# Patient Record
Sex: Female | Born: 1965 | Race: White | Hispanic: No | Marital: Married | Smoking: Never smoker
Health system: Southern US, Community
[De-identification: ages and names within clinical notes are randomized; demographics above are authoritative.]

## PROBLEM LIST (undated history)

## (undated) DIAGNOSIS — I10 Essential (primary) hypertension: Secondary | ICD-10-CM

## (undated) DIAGNOSIS — R112 Nausea with vomiting, unspecified: Secondary | ICD-10-CM

## (undated) DIAGNOSIS — R002 Palpitations: Secondary | ICD-10-CM

## (undated) DIAGNOSIS — K589 Irritable bowel syndrome without diarrhea: Secondary | ICD-10-CM

## (undated) DIAGNOSIS — Z9221 Personal history of antineoplastic chemotherapy: Secondary | ICD-10-CM

## (undated) DIAGNOSIS — C50919 Malignant neoplasm of unspecified site of unspecified female breast: Secondary | ICD-10-CM

## (undated) DIAGNOSIS — Z9889 Other specified postprocedural states: Secondary | ICD-10-CM

## (undated) DIAGNOSIS — Z923 Personal history of irradiation: Secondary | ICD-10-CM

## (undated) HISTORY — DX: Malignant neoplasm of unspecified site of unspecified female breast: C50.919

## (undated) HISTORY — DX: Palpitations: R00.2

## (undated) HISTORY — DX: Irritable bowel syndrome, unspecified: K58.9

## (undated) HISTORY — PX: GALLBLADDER SURGERY: SHX652

## (undated) HISTORY — PX: CHOLECYSTECTOMY: SHX55

---

## 2014-07-23 NOTE — Progress Notes (Signed)
     HPI: 49 year old female for evaluation of palpitations. Patient had outside echocardiogram in February 2016 that showed normal LV function and mild to moderate mitral regurgitation. Patient had brief palpitations when she was 49 years old that resolved with beta blockade. She discontinued this medication on her own after 2 years. She did well until this past January when she developed palpitations described as a "hard heart beat". No sustained palpitations. Otherwise denies dyspnea on exertion, orthopnea, PND, pedal edema, syncope or chest pain.  Current Outpatient Prescriptions  Medication Sig Dispense Refill  . metoprolol succinate (TOPROL-XL) 25 MG 24 hr tablet Take 12.5 mg by mouth daily.  11  . Multiple Vitamin (MULTIVITAMIN WITH MINERALS) TABS tablet Take 1 tablet by mouth daily.    . TRI-PREVIFEM 0.18/0.215/0.25 MG-35 MCG tablet Take 1 tablet by mouth daily.  11   No current facility-administered medications for this visit.    Allergies  Allergen Reactions  . Shellfish Allergy Nausea And Vomiting     Past Medical History  Diagnosis Date  . Palpitations     Past Surgical History  Procedure Laterality Date  . Gallbladder surgery      History   Social History  . Marital Status: Single    Spouse Name: N/A  . Number of Children: 2  . Years of Education: N/A   Occupational History  .      Hair dresser   Social History Main Topics  . Smoking status: Never Smoker   . Smokeless tobacco: Not on file  . Alcohol Use: 0.0 oz/week    0 Standard drinks or equivalent per week     Comment: Glass wine per day  . Drug Use: No  . Sexual Activity: Not on file   Other Topics Concern  . Not on file   Social History Narrative    Family History  Problem Relation Age of Onset  . Hypertension Mother   . Heart attack Mother   . Hypertension Brother   . Stroke Maternal Grandmother   . Heart disease Mother     Atrial fibrillation    ROS: no fevers or chills,  productive cough, hemoptysis, dysphasia, odynophagia, melena, hematochezia, dysuria, hematuria, rash, seizure activity, orthopnea, PND, pedal edema, claudication. Remaining systems are negative.  Physical Exam:   Blood pressure 128/80, pulse 67, height 5\' 7"  (1.702 m), weight 160 lb 6.4 oz (72.757 kg).  General:  Well developed/well nourished in NAD Skin warm/dry Patient not depressed No peripheral clubbing Back-normal HEENT-normal/normal eyelids Neck supple/normal carotid upstroke bilaterally; no bruits; no JVD; no thyromegaly chest - CTA/ normal expansion CV - RRR/normal S1 and S2; no murmurs, rubs or gallops;  PMI nondisplaced Abdomen -NT/ND, no HSM, no mass, + bowel sounds, no bruit 2+ femoral pulses, no bruits Ext-no edema, chords, 2+ DP Neuro-grossly nonfocal  ECG sinus rhythm, RV conduction delay, no ST changes.

## 2014-07-26 ENCOUNTER — Encounter: Payer: Self-pay | Admitting: Cardiology

## 2014-07-26 ENCOUNTER — Ambulatory Visit (INDEPENDENT_AMBULATORY_CARE_PROVIDER_SITE_OTHER): Payer: BLUE CROSS/BLUE SHIELD | Admitting: Cardiology

## 2014-07-26 VITALS — BP 128/80 | HR 67 | Ht 67.0 in | Wt 160.4 lb

## 2014-07-26 DIAGNOSIS — I340 Nonrheumatic mitral (valve) insufficiency: Secondary | ICD-10-CM | POA: Diagnosis not present

## 2014-07-26 DIAGNOSIS — R002 Palpitations: Secondary | ICD-10-CM

## 2014-07-26 NOTE — Assessment & Plan Note (Signed)
Plan follow-up echocardiogram in the future. 

## 2014-07-26 NOTE — Assessment & Plan Note (Signed)
Patient symptoms have improved after the addition of beta blockade. Continue Toprol. LV function normal on echocardiogram. If her symptoms worsen in the future we will plan an event monitor. I will obtain laboratories from her primary care physician concerning recent TSH and potassium.

## 2014-07-26 NOTE — Patient Instructions (Signed)
Your physician wants you to follow-up in: [place] will receive a reminder letter in the mail two months in advance. If you don't receive a letter, please call our office to schedule the follow-up appointment.

## 2014-08-19 ENCOUNTER — Encounter: Payer: Self-pay | Admitting: Cardiology

## 2014-08-20 ENCOUNTER — Other Ambulatory Visit: Payer: Self-pay | Admitting: *Deleted

## 2014-08-20 DIAGNOSIS — R002 Palpitations: Secondary | ICD-10-CM

## 2014-08-23 ENCOUNTER — Other Ambulatory Visit (INDEPENDENT_AMBULATORY_CARE_PROVIDER_SITE_OTHER): Payer: BLUE CROSS/BLUE SHIELD

## 2014-08-23 DIAGNOSIS — R002 Palpitations: Secondary | ICD-10-CM | POA: Diagnosis not present

## 2014-08-23 LAB — TSH: TSH: 3.45 u[IU]/mL (ref 0.35–4.50)

## 2015-02-07 ENCOUNTER — Other Ambulatory Visit (HOSPITAL_BASED_OUTPATIENT_CLINIC_OR_DEPARTMENT_OTHER): Payer: Self-pay | Admitting: Unknown Physician Specialty

## 2015-02-07 ENCOUNTER — Ambulatory Visit (HOSPITAL_BASED_OUTPATIENT_CLINIC_OR_DEPARTMENT_OTHER)
Admission: RE | Admit: 2015-02-07 | Discharge: 2015-02-07 | Disposition: A | Discharge status: Home / Self Care | Payer: BLUE CROSS/BLUE SHIELD | Source: Ambulatory Visit | Attending: Unknown Physician Specialty | Admitting: Unknown Physician Specialty

## 2015-02-07 DIAGNOSIS — Z1231 Encounter for screening mammogram for malignant neoplasm of breast: Secondary | ICD-10-CM

## 2016-07-04 NOTE — Progress Notes (Signed)
      HPI: FU palpitations. Patient had outside echocardiogram in February 2016 that showed normal LV function and mild to moderate mitral regurgitation. Since last seen, patient denies dyspnea, chest pain, or syncope. She continues to have an occasional skip which she finds tolerable but no sustained palpitations.   Current Outpatient Prescriptions  Medication Sig Dispense Refill  . metoprolol succinate (TOPROL-XL) 25 MG 24 hr tablet Take 12.5 mg by mouth daily.  11  . Multiple Vitamin (MULTIVITAMIN WITH MINERALS) TABS tablet Take 1 tablet by mouth daily.    . TRI-PREVIFEM 0.18/0.215/0.25 MG-35 MCG tablet Take 1 tablet by mouth daily.  11   No current facility-administered medications for this visit.      Past Medical History:  Diagnosis Date  . Palpitations     Past Surgical History:  Procedure Laterality Date  . GALLBLADDER SURGERY      Social History   Social History  . Marital status: Single    Spouse name: N/A  . Number of children: 2  . Years of education: N/A   Occupational History  .      Hair dresser   Social History Main Topics  . Smoking status: Never Smoker  . Smokeless tobacco: Never Used  . Alcohol use 0.0 oz/week     Comment: Glass wine per day  . Drug use: No  . Sexual activity: Not on file   Other Topics Concern  . Not on file   Social History Narrative  . No narrative on file    Family History  Problem Relation Age of Onset  . Hypertension Mother   . Heart attack Mother   . Heart disease Mother     Atrial fibrillation  . Hypertension Brother   . Stroke Maternal Grandmother     ROS: no fevers or chills, productive cough, hemoptysis, dysphasia, odynophagia, melena, hematochezia, dysuria, hematuria, rash, seizure activity, orthopnea, PND, pedal edema, claudication. Remaining systems are negative.  Physical Exam: Well-developed well-nourished in no acute distress.  Skin is warm and dry.  HEENT is normal.  Neck is supple. No  bruits Chest is clear to auscultation with normal expansion.  Cardiovascular exam is regular rate and rhythm.  Abdominal exam nontender or distended. No masses palpated. Extremities show no edema. neuro grossly intact  ECG- Normal sinus rhythm at a rate of 68. First-degree AV block. RV conduction delay. personally reviewed  A/P  1 Palpitations-symptoms are reasonably well controlled. Continue beta blocker.  2 mitral regurgitation-plan repeat echocardiogram.  Kirk Ruths, MD

## 2016-07-05 ENCOUNTER — Encounter: Payer: Self-pay | Admitting: Cardiology

## 2016-07-18 ENCOUNTER — Encounter: Payer: Self-pay | Admitting: Cardiology

## 2016-07-18 ENCOUNTER — Ambulatory Visit (INDEPENDENT_AMBULATORY_CARE_PROVIDER_SITE_OTHER): Payer: BLUE CROSS/BLUE SHIELD | Admitting: Cardiology

## 2016-07-18 VITALS — BP 130/78 | HR 68 | Ht 67.0 in | Wt 155.2 lb

## 2016-07-18 DIAGNOSIS — R002 Palpitations: Secondary | ICD-10-CM

## 2016-07-18 DIAGNOSIS — I059 Rheumatic mitral valve disease, unspecified: Secondary | ICD-10-CM

## 2016-07-18 MED ORDER — METOPROLOL SUCCINATE ER 25 MG PO TB24: 12.5000 mg | ORAL_TABLET | Freq: Every day | ORAL | 3 refills | Status: DC

## 2016-07-18 NOTE — Patient Instructions (Signed)
Medication Instructions:   Refill sent to the pharmacy electronically.   Testing/Procedures:  Your physician has requested that you have an echocardiogram. Echocardiography is a painless test that uses sound waves to create images of your heart. It provides your doctor with information about the size and shape of your heart and how well your heart's chambers and valves are working. This procedure takes approximately one hour. There are no restrictions for this procedure.    Follow-Up:  Your physician wants you to follow-up in: [place] will receive a reminder letter in the mail two months in advance. If you don't receive a letter, please call our office to schedule the follow-up appointment.   If you need a refill on your cardiac medications before your next appointment, please call your pharmacy.

## 2016-07-23 ENCOUNTER — Telehealth: Payer: Self-pay | Admitting: Cardiology

## 2016-07-23 NOTE — Telephone Encounter (Signed)
New Message     1. What dental office are you calling from? Dr Gilford Raid  2. What is your office phone and fax number? Fax [phone] ofc [phone]  3. What type of procedure is the patient having performed? Not scheduled yet there for consult  4. What date is procedure scheduled? Not schedule   5. What is your question (ex. Antibiotics prior to procedure, holding medication-we need to know how long dentist wants pt to hold med)? Does pt need premed? Or hold meds?

## 2016-07-23 NOTE — Telephone Encounter (Signed)
Spoke with dr Elyse Hsu office, they are wanting to know if patient needs antibiotics prior to any dental procedures. Patient has a history of Mitral Regurg, therefore; per the quidelines, she does not require antibiotics prior to dental procedures. Will fax this note to the confirmed fax number provided.

## 2016-08-01 ENCOUNTER — Ambulatory Visit (HOSPITAL_BASED_OUTPATIENT_CLINIC_OR_DEPARTMENT_OTHER): Payer: BLUE CROSS/BLUE SHIELD

## 2017-03-08 LAB — HM PAP SMEAR: HM Pap smear: NEGATIVE

## 2018-06-04 ENCOUNTER — Other Ambulatory Visit: Payer: Self-pay

## 2018-06-04 ENCOUNTER — Emergency Department (HOSPITAL_BASED_OUTPATIENT_CLINIC_OR_DEPARTMENT_OTHER)
Admission: EM | Admit: 2018-06-04 | Discharge: 2018-06-04 | Disposition: A | Discharge status: Home / Self Care | Payer: BLUE CROSS/BLUE SHIELD | Attending: Emergency Medicine | Admitting: Emergency Medicine

## 2018-06-04 ENCOUNTER — Emergency Department (HOSPITAL_BASED_OUTPATIENT_CLINIC_OR_DEPARTMENT_OTHER): Payer: BLUE CROSS/BLUE SHIELD

## 2018-06-04 ENCOUNTER — Encounter (HOSPITAL_BASED_OUTPATIENT_CLINIC_OR_DEPARTMENT_OTHER): Payer: Self-pay | Admitting: Emergency Medicine

## 2018-06-04 DIAGNOSIS — R109 Unspecified abdominal pain: Secondary | ICD-10-CM | POA: Diagnosis not present

## 2018-06-04 DIAGNOSIS — R079 Chest pain, unspecified: Secondary | ICD-10-CM | POA: Insufficient documentation

## 2018-06-04 DIAGNOSIS — Z79899 Other long term (current) drug therapy: Secondary | ICD-10-CM | POA: Diagnosis not present

## 2018-06-04 DIAGNOSIS — R1013 Epigastric pain: Secondary | ICD-10-CM | POA: Diagnosis not present

## 2018-06-04 LAB — URINALYSIS, ROUTINE W REFLEX MICROSCOPIC
Bilirubin Urine: NEGATIVE
GLUCOSE, UA: NEGATIVE mg/dL
KETONES UR: NEGATIVE mg/dL
Nitrite: NEGATIVE
PROTEIN: NEGATIVE mg/dL
Specific Gravity, Urine: 1.02 (ref 1.005–1.030)
pH: 6.5 (ref 5.0–8.0)

## 2018-06-04 LAB — CBC WITH DIFFERENTIAL/PLATELET
Abs Immature Granulocytes: 0.01 10*3/uL (ref 0.00–0.07)
BASOS PCT: 1 %
Basophils Absolute: 0 10*3/uL (ref 0.0–0.1)
EOS PCT: 4 %
Eosinophils Absolute: 0.2 10*3/uL (ref 0.0–0.5)
HCT: 39.6 % (ref 36.0–46.0)
Hemoglobin: 12.7 g/dL (ref 12.0–15.0)
Immature Granulocytes: 0 %
Lymphocytes Relative: 26 %
Lymphs Abs: 1.5 10*3/uL (ref 0.7–4.0)
MCH: 31.1 pg (ref 26.0–34.0)
MCHC: 32.1 g/dL (ref 30.0–36.0)
MCV: 96.8 fL (ref 80.0–100.0)
Monocytes Absolute: 0.4 10*3/uL (ref 0.1–1.0)
Monocytes Relative: 7 %
NRBC: 0 % (ref 0.0–0.2)
Neutro Abs: 3.7 10*3/uL (ref 1.7–7.7)
Neutrophils Relative %: 62 %
PLATELETS: 282 10*3/uL (ref 150–400)
RBC: 4.09 MIL/uL (ref 3.87–5.11)
RDW: 12.4 % (ref 11.5–15.5)
WBC: 6 10*3/uL (ref 4.0–10.5)

## 2018-06-04 LAB — COMPREHENSIVE METABOLIC PANEL
ALK PHOS: 42 U/L (ref 38–126)
ALT: 13 U/L (ref 0–44)
ANION GAP: 7 (ref 5–15)
AST: 15 U/L (ref 15–41)
Albumin: 3.7 g/dL (ref 3.5–5.0)
BUN: 13 mg/dL (ref 6–20)
CALCIUM: 9 mg/dL (ref 8.9–10.3)
CHLORIDE: 105 mmol/L (ref 98–111)
CO2: 24 mmol/L (ref 22–32)
Creatinine, Ser: 0.72 mg/dL (ref 0.44–1.00)
GFR calc non Af Amer: >60 mL/min (ref >60)
Glucose, Bld: 100 mg/dL — ABNORMAL HIGH (ref 70–99)
POTASSIUM: 3.7 mmol/L (ref 3.5–5.1)
SODIUM: 136 mmol/L (ref 135–145)
Total Bilirubin: 0.5 mg/dL (ref 0.3–1.2)
Total Protein: 7.2 g/dL (ref 6.5–8.1)

## 2018-06-04 LAB — URINALYSIS, MICROSCOPIC (REFLEX)

## 2018-06-04 LAB — TROPONIN I

## 2018-06-04 LAB — D-DIMER, QUANTITATIVE (NOT AT ARMC)

## 2018-06-04 LAB — LIPASE, BLOOD: Lipase: 29 U/L (ref 11–51)

## 2018-06-04 NOTE — ED Provider Notes (Signed)
[facility] Provider Note   CSN: [id number] Arrival date & time: 06/04/18  5009    History   Chief Complaint Chief Complaint  Patient presents with  . Flank Pain    HPI Wendy Webb is a 53 y.o. female.     Patient presents to the emergency department for evaluation of right-sided pain.  Patient has had several days of intermittent pain in the right flank area and then noted that the pain became constant yesterday.  She reports it as a burning type sensation in the right upper abdomen/lower chest that radiates around to the back.  No associated cough, shortness of breath.  She has not noticed any rash.  She denies injury.  Pain does not worsen with movement.  She has had a previous cholecystectomy.  She does, however, report that the symptoms remind her of her gallbladder problems.  She has been experiencing constipation over the last month or so and has had increased belching.  She reports mild nausea, no vomiting.  No diarrhea.     Past Medical History:  Diagnosis Date  . Palpitations     Patient Active Problem List   Diagnosis Date Noted  . Palpitations 07/26/2014  . Mitral regurgitation 07/26/2014    Past Surgical History:  Procedure Laterality Date  . GALLBLADDER SURGERY       OB History   No obstetric history on file.      Home Medications    Prior to Admission medications   Medication Sig Start Date End Date Taking? Authorizing Provider  metoprolol succinate (TOPROL-XL) 25 MG 24 hr tablet Take 0.5 tablets (12.5 mg total) by mouth daily. 07/18/16   Lelon Perla, MD  Multiple Vitamin (MULTIVITAMIN WITH MINERALS) TABS tablet Take 1 tablet by mouth daily.    [provider]  TRI-PREVIFEM 0.18/0.215/0.25 MG-35 MCG tablet Take 1 tablet by mouth daily. 07/11/14   [provider]    Family History Family History  Problem Relation Age of Onset  . Hypertension Mother   . Heart attack Mother   . Heart  disease Mother        Atrial fibrillation  . Hypertension Brother   . Stroke Maternal Grandmother     Social History Social History   Tobacco Use  . Smoking status: Never Smoker  . Smokeless tobacco: Never Used  Substance Use Topics  . Alcohol use: Yes    Alcohol/week: 0.0 standard drinks    Comment: Glass wine per day  . Drug use: No     Allergies   Shellfish allergy   Review of Systems Review of Systems  Respiratory: Negative for cough and shortness of breath.   Gastrointestinal: Positive for abdominal pain, constipation and nausea.  All other systems reviewed and are negative.    Physical Exam Updated Vital Signs BP (!) 142/70 (BP Location: Right Arm)   Pulse 69   Temp 98.5 F (36.9 C) (Oral)   Resp 18   Ht 5\' 7"  (1.702 m)   Wt 69.4 kg   LMP 05/14/2018   SpO2 100%   BMI 23.96 kg/m   Physical Exam Vitals signs and nursing note reviewed.  Constitutional:      General: She is not in acute distress.    Appearance: Normal appearance. She is well-developed.  HENT:     Head: Normocephalic and atraumatic.     Right Ear: Hearing normal.     Left Ear: Hearing normal.     Nose: Nose normal.  Eyes:     Conjunctiva/sclera: Conjunctivae normal.     Pupils: Pupils are equal, round, and reactive to light.  Neck:     Musculoskeletal: Normal range of motion and neck supple.  Cardiovascular:     Rate and Rhythm: Regular rhythm.     Heart sounds: S1 normal and S2 normal. No murmur. No friction rub. No gallop.   Pulmonary:     Effort: Pulmonary effort is normal. No respiratory distress.     Breath sounds: Normal breath sounds.  Chest:     Chest wall: No tenderness.  Abdominal:     General: Bowel sounds are normal.     Palpations: Abdomen is soft.     Tenderness: There is abdominal tenderness (Slight) in the epigastric area. There is no guarding or rebound. Negative signs include Murphy's sign and McBurney's sign.     Hernia: No hernia is present.    Musculoskeletal: Normal range of motion.  Skin:    General: Skin is warm and dry.     Findings: No rash.  Neurological:     Mental Status: She is alert and oriented to person, place, and time.     GCS: GCS eye subscore is 4. GCS verbal subscore is 5. GCS motor subscore is 6.     Cranial Nerves: No cranial nerve deficit.     Sensory: No sensory deficit.     Coordination: Coordination normal.  Psychiatric:        Speech: Speech normal.        Behavior: Behavior normal.        Thought Content: Thought content normal.      ED Treatments / Results  Labs (all labs ordered are listed, but only abnormal results are displayed) Labs Reviewed  URINALYSIS, ROUTINE W REFLEX MICROSCOPIC - Abnormal; Notable for the following components:      Result Value   Hgb urine dipstick TRACE (*)    Leukocytes,Ua MODERATE (*)    All other components within normal limits  URINALYSIS, MICROSCOPIC (REFLEX) - Abnormal; Notable for the following components:   Bacteria, UA MANY (*)    All other components within normal limits  CBC WITH DIFFERENTIAL/PLATELET  COMPREHENSIVE METABOLIC PANEL  LIPASE, BLOOD  D-DIMER, QUANTITATIVE (NOT AT [facility])  TROPONIN I    EKG EKG Interpretation  Date/Time:  Wednesday June 04 2018 06:28:02 EST Ventricular Rate:  69 PR Interval:    QRS Duration: 104 QT Interval:  403 QTC Calculation: 432 R Axis:   -13 Text Interpretation:  Sinus rhythm Borderline prolonged PR interval Baseline wander in lead(s) II III aVF V4 Otherwise within normal limits No previous tracing Confirmed by Orpah Greek [phone]) on 06/04/2018 6:29:47 AM   Radiology No results found.  Procedures Procedures (including critical care time)  Medications Ordered in ED Medications - No data to display   Initial Impression / Assessment and Plan / ED Course  I have reviewed the triage vital signs and the nursing notes.  Pertinent labs & imaging results that were available during my care  of the patient were reviewed by me and considered in my medical decision making (see chart for details).        Patient indicates the area of the anterior and lateral mid rib fields as the area that she is experiencing the pain.  She does not have any respiratory symptoms, oxygenation is 100%.  No tachycardia or tachypnea.  She denies unilateral leg swelling, recent surgery, recent long distance travel.  She does take  oral birth control pills.  She has never had a blood clot.  Examination of the chest wall does not reveal any evidence of shingles.  There is no pain with light touch.  Pain is not significantly reproducible.  She does not, however, have any right upper quadrant tenderness.  There is some mild epigastric tenderness, but no guarding or rebound.  Differential diagnosis would be musculoskeletal pain, GI pain such as gastritis, cardiac pain, pulmonary etiology including infectious and PE.  Work-up initiated and will sign out to oncoming ER physician to follow-up imaging and labs, disposition patient.  Final Clinical Impressions(s) / ED Diagnoses   Final diagnoses:  Right-sided chest pain    ED Discharge Orders    None       Orpah Greek, MD 06/04/18 [phone]

## 2018-06-04 NOTE — ED Provider Notes (Signed)
Patient care assumed at 0700. Patient here for evaluation of one week of right sided chest wall/flank pain. UA with bacteria present, no additional findings concerning for UTI and patient does not have any dysuria, will send culture and only treat if positive. Presentation is not consistent with ACS, PE, dissection, acute abdomen. Discussed with patient home care for right-sided pain. Discussed outpatient follow-up and return precautions.   Quintella Reichert, MD 06/04/18 1525

## 2018-06-04 NOTE — ED Triage Notes (Signed)
Pt c/o flank pain for around a week right upper abd radiating to the back. Denies any urinary symptoms.

## 2018-06-05 LAB — URINE CULTURE: CULTURE: NO GROWTH

## 2018-06-09 DIAGNOSIS — M549 Dorsalgia, unspecified: Secondary | ICD-10-CM | POA: Diagnosis not present

## 2018-12-14 DIAGNOSIS — S40862A Insect bite (nonvenomous) of left upper arm, initial encounter: Secondary | ICD-10-CM | POA: Diagnosis not present

## 2019-02-02 DIAGNOSIS — R1011 Right upper quadrant pain: Secondary | ICD-10-CM | POA: Diagnosis not present

## 2019-02-02 DIAGNOSIS — Z0000 Encounter for general adult medical examination without abnormal findings: Secondary | ICD-10-CM | POA: Diagnosis not present

## 2019-02-09 DIAGNOSIS — R1011 Right upper quadrant pain: Secondary | ICD-10-CM | POA: Diagnosis not present

## 2019-02-09 DIAGNOSIS — R1010 Upper abdominal pain, unspecified: Secondary | ICD-10-CM | POA: Diagnosis not present

## 2019-02-09 DIAGNOSIS — Z0000 Encounter for general adult medical examination without abnormal findings: Secondary | ICD-10-CM | POA: Diagnosis not present

## 2019-04-02 DIAGNOSIS — Z20828 Contact with and (suspected) exposure to other viral communicable diseases: Secondary | ICD-10-CM | POA: Diagnosis not present

## 2019-05-22 DIAGNOSIS — I10 Essential (primary) hypertension: Secondary | ICD-10-CM | POA: Diagnosis not present

## 2019-05-29 DIAGNOSIS — N951 Menopausal and female climacteric states: Secondary | ICD-10-CM | POA: Diagnosis not present

## 2019-05-29 DIAGNOSIS — I10 Essential (primary) hypertension: Secondary | ICD-10-CM | POA: Diagnosis not present

## 2019-05-29 DIAGNOSIS — Z01419 Encounter for gynecological examination (general) (routine) without abnormal findings: Secondary | ICD-10-CM | POA: Diagnosis not present

## 2019-05-29 DIAGNOSIS — G43109 Migraine with aura, not intractable, without status migrainosus: Secondary | ICD-10-CM | POA: Diagnosis not present

## 2019-05-30 ENCOUNTER — Emergency Department (HOSPITAL_BASED_OUTPATIENT_CLINIC_OR_DEPARTMENT_OTHER): Payer: BC Managed Care – PPO

## 2019-05-30 ENCOUNTER — Encounter (HOSPITAL_BASED_OUTPATIENT_CLINIC_OR_DEPARTMENT_OTHER): Payer: Self-pay | Admitting: Emergency Medicine

## 2019-05-30 ENCOUNTER — Other Ambulatory Visit: Payer: Self-pay

## 2019-05-30 ENCOUNTER — Emergency Department (HOSPITAL_BASED_OUTPATIENT_CLINIC_OR_DEPARTMENT_OTHER)
Admission: EM | Admit: 2019-05-30 | Discharge: 2019-05-30 | Disposition: A | Discharge status: Home / Self Care | Payer: BC Managed Care – PPO | Attending: Emergency Medicine | Admitting: Emergency Medicine

## 2019-05-30 DIAGNOSIS — I10 Essential (primary) hypertension: Secondary | ICD-10-CM | POA: Insufficient documentation

## 2019-05-30 DIAGNOSIS — R1013 Epigastric pain: Secondary | ICD-10-CM | POA: Diagnosis not present

## 2019-05-30 DIAGNOSIS — Z79899 Other long term (current) drug therapy: Secondary | ICD-10-CM | POA: Insufficient documentation

## 2019-05-30 DIAGNOSIS — R1030 Lower abdominal pain, unspecified: Secondary | ICD-10-CM | POA: Diagnosis not present

## 2019-05-30 DIAGNOSIS — R1011 Right upper quadrant pain: Secondary | ICD-10-CM | POA: Diagnosis not present

## 2019-05-30 DIAGNOSIS — R1084 Generalized abdominal pain: Secondary | ICD-10-CM | POA: Insufficient documentation

## 2019-05-30 DIAGNOSIS — R109 Unspecified abdominal pain: Secondary | ICD-10-CM | POA: Diagnosis not present

## 2019-05-30 DIAGNOSIS — R1010 Upper abdominal pain, unspecified: Secondary | ICD-10-CM | POA: Diagnosis not present

## 2019-05-30 HISTORY — DX: Essential (primary) hypertension: I10

## 2019-05-30 LAB — CBC WITH DIFFERENTIAL/PLATELET
Abs Immature Granulocytes: 0.03 10*3/uL (ref 0.00–0.07)
Basophils Absolute: 0 10*3/uL (ref 0.0–0.1)
Basophils Relative: 0 %
Eosinophils Absolute: 0 10*3/uL (ref 0.0–0.5)
Eosinophils Relative: 0 %
HCT: 40.2 % (ref 36.0–46.0)
Hemoglobin: 13.2 g/dL (ref 12.0–15.0)
Immature Granulocytes: 0 %
Lymphocytes Relative: 13 %
Lymphs Abs: 1.2 10*3/uL (ref 0.7–4.0)
MCH: 31.9 pg (ref 26.0–34.0)
MCHC: 32.8 g/dL (ref 30.0–36.0)
MCV: 97.1 fL (ref 80.0–100.0)
Monocytes Absolute: 0.5 10*3/uL (ref 0.1–1.0)
Monocytes Relative: 6 %
Neutro Abs: 7.1 10*3/uL (ref 1.7–7.7)
Neutrophils Relative %: 81 %
Platelets: 250 10*3/uL (ref 150–400)
RBC: 4.14 MIL/uL (ref 3.87–5.11)
RDW: 12.7 % (ref 11.5–15.5)
WBC: 8.9 10*3/uL (ref 4.0–10.5)
nRBC: 0 % (ref 0.0–0.2)

## 2019-05-30 LAB — COMPREHENSIVE METABOLIC PANEL
ALT: 15 U/L (ref 0–44)
AST: 16 U/L (ref 15–41)
Albumin: 3.3 g/dL — ABNORMAL LOW (ref 3.5–5.0)
Alkaline Phosphatase: 39 U/L (ref 38–126)
Anion gap: 7 (ref 5–15)
BUN: 11 mg/dL (ref 6–20)
CO2: 26 mmol/L (ref 22–32)
Calcium: 8.9 mg/dL (ref 8.9–10.3)
Chloride: 102 mmol/L (ref 98–111)
Creatinine, Ser: 0.65 mg/dL (ref 0.44–1.00)
GFR calc Af Amer: >60 mL/min (ref >60)
GFR calc non Af Amer: >60 mL/min (ref >60)
Glucose, Bld: 99 mg/dL (ref 70–99)
Potassium: 4 mmol/L (ref 3.5–5.1)
Sodium: 135 mmol/L (ref 135–145)
Total Bilirubin: 0.3 mg/dL (ref 0.3–1.2)
Total Protein: 6.7 g/dL (ref 6.5–8.1)

## 2019-05-30 LAB — URINALYSIS, ROUTINE W REFLEX MICROSCOPIC
Bilirubin Urine: NEGATIVE
Glucose, UA: NEGATIVE mg/dL
Ketones, ur: 15 mg/dL — AB
Nitrite: NEGATIVE
Protein, ur: NEGATIVE mg/dL
Specific Gravity, Urine: 1.02 (ref 1.005–1.030)
pH: 5.5 (ref 5.0–8.0)

## 2019-05-30 LAB — URINALYSIS, MICROSCOPIC (REFLEX)

## 2019-05-30 LAB — PREGNANCY, URINE: Preg Test, Ur: NEGATIVE

## 2019-05-30 LAB — LIPASE, BLOOD: Lipase: 21 U/L (ref 11–51)

## 2019-05-30 MED ORDER — IOHEXOL 300 MG/ML  SOLN
100.0000 mL | Freq: Once | INTRAMUSCULAR | Status: AC | PRN
  Administered 2019-05-30: 100 mL via INTRAVENOUS

## 2019-05-30 MED ORDER — FAMOTIDINE IN NACL 20-0.9 MG/50ML-% IV SOLN
20.0000 mg | Freq: Once | INTRAVENOUS | Status: AC
  Administered 2019-05-30: 20 mg via INTRAVENOUS
  Filled 2019-05-30: qty 50

## 2019-05-30 MED ORDER — DICYCLOMINE HCL 20 MG PO TABS: 20.0000 mg | ORAL_TABLET | Freq: Two times a day (BID) | ORAL | 0 refills | Status: DC

## 2019-05-30 MED ORDER — AMOXICILLIN-POT CLAVULANATE 875-125 MG PO TABS: 1.0000 | ORAL_TABLET | Freq: Two times a day (BID) | ORAL | 0 refills | Status: DC

## 2019-05-30 MED ORDER — PANTOPRAZOLE SODIUM 40 MG IV SOLR
40.0000 mg | Freq: Once | INTRAVENOUS | Status: AC
  Administered 2019-05-30: 40 mg via INTRAVENOUS
  Filled 2019-05-30: qty 40

## 2019-05-30 MED ORDER — ONDANSETRON 4 MG PO TBDP: 4.0000 mg | ORAL_TABLET | Freq: Three times a day (TID) | ORAL | 0 refills | Status: DC | PRN

## 2019-05-30 MED ORDER — SODIUM CHLORIDE 0.9 % IV BOLUS
1000.0000 mL | Freq: Once | INTRAVENOUS | Status: AC
  Administered 2019-05-30: 1000 mL via INTRAVENOUS

## 2019-05-30 NOTE — ED Triage Notes (Signed)
Pt c/o BLQ pain since yesterday; denies NVD; pain worse with palpation; sent here from [facility]

## 2019-05-30 NOTE — ED Notes (Signed)
Patient transported to CT 

## 2019-05-30 NOTE — Discharge Instructions (Addendum)
Abdominal pain  Hand washing: Wash your hands throughout the day, but especially before and after touching the face, using the restroom, sneezing, coughing, or touching surfaces that have been coughed or sneezed upon. Hydration: Symptoms will be intensified and complicated by dehydration. Dehydration can also extend the duration of symptoms. Drink plenty of fluids and get plenty of rest. You should be drinking at least half a liter of water an hour to stay hydrated. Electrolyte drinks (ex. Gatorade, Powerade, Pedialyte) are also encouraged. You should be drinking enough fluids to make your urine light yellow, almost clear. If this is not the case, you are not drinking enough water. Please note that some of the treatments indicated below will not be effective if you are not adequately hydrated. Diet: Please concentrate on hydration, however, you may introduce food slowly.  Start with a clear liquid diet, progressed to a full liquid diet, and then bland solids as you are able. Pain or fever: Tylenol for pain or fever.  Nausea/vomiting: Use the ondansetron (generic for Zofran) for nausea or vomiting.  This medication may not prevent all vomiting or nausea, but can help facilitate better hydration. Things that can help with nausea/vomiting also include peppermint/menthol candies, vitamin B12, and ginger. Diarrhea: Should you developed diarrhea, you may use medications such as loperamide (Imodium) or Bismuth subsalicylate (Pepto-Bismol). Bentyl: This medication is what is known as an antispasmodic and is intended to help reduce abdominal discomfort. Follow-up: Follow-up with a primary care provider and/or gastroenterology on this matter. Return: Return should you develop a fever, bloody diarrhea, increased abdominal pain, uncontrolled vomiting, or any other major concerns.  For prescription assistance, may try using prescription discount sites or apps, such as goodrx.com

## 2019-05-30 NOTE — ED Provider Notes (Signed)
[place] EMERGENCY DEPARTMENT Provider Note   CSN: [id number] Arrival date & time: 05/30/19  [id number]     History Chief Complaint  Patient presents with  . Abdominal Pain    Wendy Webb is a 54 y.o. female.  HPI      Wendy Webb is a 54 y.o. female, with a history of HTN and cholecystectomy, presenting to the ED with upper abdominal pain for the last 2 days.  Pain seems to be centered in the epigastric and right upper quadrant regions, "feels like a twisting," waxing and waning, 3-7/10, worse last night, worse with lying down, radiating throughout the abdomen.  She has not had this pain before. LMP May 14, 2019.  Last bowel movement was this morning and was normal. She typically consumes about 6 alcoholic beverages a week.  Denies NSAID use.  Denies illicit drug use. Denies fever/chills, chest pain, shortness of breath, N/V/C/D, dysuria, hematuria, dizziness, diaphoresis, hematochezia/melena, or any other complaints.    Past Medical History:  Diagnosis Date  . Hypertension   . Palpitations     Patient Active Problem List   Diagnosis Date Noted  . Palpitations 07/26/2014  . Mitral regurgitation 07/26/2014    Past Surgical History:  Procedure Laterality Date  . GALLBLADDER SURGERY       OB History   No obstetric history on file.     Family History  Problem Relation Age of Onset  . Hypertension Mother   . Heart attack Mother   . Heart disease Mother        Atrial fibrillation  . Hypertension Brother   . Stroke Maternal Grandmother     Social History   Tobacco Use  . Smoking status: Never Smoker  . Smokeless tobacco: Never Used  Substance Use Topics  . Alcohol use: Yes    Alcohol/week: 0.0 standard drinks    Comment: Glass wine per day  . Drug use: No    Home Medications Prior to Admission medications   Medication Sig Start Date End Date Taking? Authorizing Provider  lisinopril (ZESTRIL) 5 MG tablet Take 1/2 tablet by mouth once  daily for 2-3 days to reach BP goal of <140/90 on consistent basis; may increase to full tablet if well tolerated and needed to reach goal. 05/22/19  Yes [provider]  amoxicillin-clavulanate (AUGMENTIN) 875-125 MG tablet Take 1 tablet by mouth every 12 (twelve) hours. 05/30/19   Orlen Leedy C, PA-C  dicyclomine (BENTYL) 20 MG tablet Take 1 tablet (20 mg total) by mouth 2 (two) times daily. 05/30/19   Taeya Theall C, PA-C  metoprolol succinate (TOPROL-XL) 25 MG 24 hr tablet Take 0.5 tablets (12.5 mg total) by mouth daily. 07/18/16   Lelon Perla, MD  Multiple Vitamin (MULTIVITAMIN WITH MINERALS) TABS tablet Take 1 tablet by mouth daily.    [provider]  ondansetron (ZOFRAN ODT) 4 MG disintegrating tablet Take 1 tablet (4 mg total) by mouth every 8 (eight) hours as needed for nausea or vomiting. 05/30/19   Nateisha Moyd C, PA-C  TRI-PREVIFEM 0.18/0.215/0.25 MG-35 MCG tablet Take 1 tablet by mouth daily. 07/11/14   [provider]  Vitamins/Minerals TABS Take by mouth.    [provider]    Allergies    Shellfish allergy  Review of Systems   Review of Systems  Constitutional: Negative for chills, diaphoresis and fever.  Respiratory: Negative for shortness of breath.   Cardiovascular: Negative for chest pain and leg swelling.  Gastrointestinal: Positive for abdominal  pain. Negative for blood in stool, diarrhea, nausea and vomiting.  Genitourinary: Negative for difficulty urinating, dysuria and hematuria.  Musculoskeletal: Negative for back pain.  Neurological: Negative for dizziness, syncope and weakness.  All other systems reviewed and are negative.   Physical Exam Updated Vital Signs BP 125/75 (BP Location: Right Arm)   Pulse 74   Temp 98.1 F (36.7 C) (Oral)   Resp 20   Ht 5\' 7"  (1.702 m)   Wt 72.6 kg   LMP 05/14/2019   SpO2 100%   BMI 25.06 kg/m   Physical Exam Vitals and nursing note reviewed.  Constitutional:      General: She is not in  acute distress.    Appearance: She is well-developed. She is not diaphoretic.  HENT:     Head: Normocephalic and atraumatic.     Mouth/Throat:     Mouth: Mucous membranes are moist.     Pharynx: Oropharynx is clear.  Eyes:     Conjunctiva/sclera: Conjunctivae normal.  Cardiovascular:     Rate and Rhythm: Normal rate and regular rhythm.     Pulses: Normal pulses.          Radial pulses are 2+ on the right side and 2+ on the left side.       Posterior tibial pulses are 2+ on the right side and 2+ on the left side.     Heart sounds: Normal heart sounds.     Comments: Tactile temperature in the extremities appropriate and equal bilaterally. Pulmonary:     Effort: Pulmonary effort is normal. No respiratory distress.     Breath sounds: Normal breath sounds.  Abdominal:     Palpations: Abdomen is soft.     Tenderness: There is abdominal tenderness. There is right CVA tenderness. There is no left CVA tenderness or guarding.    Musculoskeletal:     Cervical back: Neck supple.     Right lower leg: No edema.     Left lower leg: No edema.  Lymphadenopathy:     Cervical: No cervical adenopathy.  Skin:    General: Skin is warm and dry.  Neurological:     Mental Status: She is alert.  Psychiatric:        Mood and Affect: Mood and affect normal.        Speech: Speech normal.        Behavior: Behavior normal.     ED Results / Procedures / Treatments   Labs (all labs ordered are listed, but only abnormal results are displayed) Labs Reviewed  URINALYSIS, ROUTINE W REFLEX MICROSCOPIC - Abnormal; Notable for the following components:      Result Value   Hgb urine dipstick SMALL (*)    Ketones, ur 15 (*)    Leukocytes,Ua TRACE (*)    All other components within normal limits  COMPREHENSIVE METABOLIC PANEL - Abnormal; Notable for the following components:   Albumin 3.3 (*)    All other components within normal limits  URINALYSIS, MICROSCOPIC (REFLEX) - Abnormal; Notable for the  following components:   Bacteria, UA MANY (*)    All other components within normal limits  URINE CULTURE  PREGNANCY, URINE  LIPASE, BLOOD  CBC WITH DIFFERENTIAL/PLATELET    EKG EKG Interpretation  Date/Time:  Saturday May 30 2019 11:27:04 EST Ventricular Rate:  69 PR Interval:    QRS Duration: 107 QT Interval:  415 QTC Calculation: 445 R Axis:   -7 Text Interpretation: Sinus rhythm RSR' in V1 or V2, right  VCD or RVH Confirmed by Madalyn Rob [phone]) on 05/30/2019 11:30:05 AM   Radiology CT ABDOMEN PELVIS W CONTRAST  Result Date: 05/30/2019 CLINICAL DATA:  Abdominal pain EXAM: CT ABDOMEN AND PELVIS WITH CONTRAST TECHNIQUE: Multidetector CT imaging of the abdomen and pelvis was performed using the standard protocol following bolus administration of intravenous contrast. CONTRAST:  129mL OMNIPAQUE IOHEXOL 300 MG/ML  SOLN COMPARISON:  Abdominal radiograph dated 06/04/2018 FINDINGS: Lower chest: There is minimal right basilar atelectasis. Hepatobiliary: No focal liver abnormality is seen. Status post cholecystectomy. No biliary dilatation. Pancreas: Unremarkable. No pancreatic ductal dilatation or surrounding inflammatory changes. Spleen: Normal in size without focal abnormality. Adrenals/Urinary Tract: Adrenal glands are unremarkable. Kidneys are normal, without renal calculi, focal lesion, or hydronephrosis. Bladder is unremarkable. Stomach/Bowel: Stomach is within normal limits. There is bowel wall thickening and hyperenhancement of the jejunum with moderate volume ascites. There are prominent mesenteric lymph nodes which are likely reactive. Appendix appears normal. No evidence of bowel obstruction. Vascular/Lymphatic: No significant vascular findings are present. No enlarged pelvic lymph nodes. Reproductive: Uterus and bilateral adnexa are unremarkable. Other: No abdominal wall hernia or abnormality. Musculoskeletal: Degenerative changes are seen at L5-S1. IMPRESSION: Bowel wall  thickening and hyperenhancement of the jejunum with moderate volume ascites. This likely reflects an infectious or inflammatory enteritis. Electronically Signed   By: Zerita Boers M.D.   On: 05/30/2019 12:20    Procedures Procedures (including critical care time)  Medications Ordered in ED Medications  sodium chloride 0.9 % bolus 1,000 mL ( Intravenous Stopped 05/30/19 1231)  pantoprazole (PROTONIX) injection 40 mg (40 mg Intravenous Given 05/30/19 1113)  famotidine (PEPCID) IVPB 20 mg premix ( Intravenous Stopped 05/30/19 1146)  iohexol (OMNIPAQUE) 300 MG/ML solution 100 mL (100 mLs Intravenous Contrast Given 05/30/19 1156)    ED Course  I have reviewed the triage vital signs and the nursing notes.  Pertinent labs & imaging results that were available during my care of the patient were reviewed by me and considered in my medical decision making (see chart for details).    MDM Rules/Calculators/A&P                      Patient presents with abdominal pain. Patient is nontoxic appearing, afebrile, not tachycardic, not tachypneic, not hypotensive, maintains excellent SPO2 on room air, and is in no apparent distress.  No leukocytosis.  Lab work reassuring. I have reviewed the patient's chart to obtain more information. I reviewed and interpreted the patient's labs and radiological studies. CT with bowel wall thickening and edema, radiologist interprets as possible enteritis. Enteritis is certainly a possibility, however, due to the patient's lack of other symptoms, such as diarrhea, we will encourage the patient to have close follow-up. Message was sent to patient's new PCP, Debbrah Alar, NP, to establish close follow up.  Information was also given at discharge for gastroenterology. Repeat abdominal exams reassuring. The patient was given instructions for home care as well as return precautions. Patient voices understanding of these instructions, accepts the plan, and is comfortable with  discharge.    Findings and plan of care discussed with Madalyn Rob, MD. Dr. Roslynn Amble personally evaluated and examined this patient.  Vitals:   05/30/19 1130 05/30/19 1210 05/30/19 1230 05/30/19 1300  BP: 130/73 133/68 137/79 135/71  Pulse: 69 86 75 72  Resp: 13 18 14 15   Temp:      TempSrc:      SpO2: 100% 100% 100% 100%  Weight:  Height:         Final Clinical Impression(s) / ED Diagnoses Final diagnoses:  Generalized abdominal pain    Rx / DC Orders ED Discharge Orders         Ordered    amoxicillin-clavulanate (AUGMENTIN) 875-125 MG tablet  Every 12 hours     05/30/19 1316    dicyclomine (BENTYL) 20 MG tablet  2 times daily     05/30/19 1316    ondansetron (ZOFRAN ODT) 4 MG disintegrating tablet  Every 8 hours PRN     05/30/19 1316           Lorayne Bender, PA-C 05/30/19 1338    Lucrezia Starch, MD 05/31/19 [phone]

## 2019-05-30 NOTE — ED Notes (Signed)
ED Provider at bedside. 

## 2019-05-31 ENCOUNTER — Telehealth: Payer: Self-pay | Admitting: Family

## 2019-05-31 LAB — URINE CULTURE: Culture: NO GROWTH

## 2019-05-31 NOTE — Telephone Encounter (Signed)
-----   Message from Lorayne Bender, [place] sent at 05/30/2019  1:17 PM EST ----- Ms. Inda Castle,  I saw Ms. Carbin in the [facility] ED today for abdominal pain. She tells me she has an appointment to establish care with you in the next couple weeks. I was wondering if there was a way she could see you sooner. The CT today was read as enteritis, however, other than abdominal pain, she didn't have any other symptoms typically found with enteritis.  This, of course, could still be the case, however, due to the lack of other symptoms and the amount of edema noted on the CT, I was hoping to get her close follow-up. My proposed plan was for her to have close follow-up with you and you could refer her to gastroenterology, as needed, based on her symptoms at that time.  However, if a part of this plan is not possible, please let me know and I will direct a message to gastroenterology to see if they would be able to follow-up with her in the next week or so.  Thank you,  Shawn   Lorayne Bender, PA-C

## 2019-05-31 NOTE — Telephone Encounter (Signed)
Please call pt to schedule an acute ED follow up visit with me this week. She has an establish care visit with me in March.

## 2019-06-01 NOTE — Progress Notes (Signed)
HPI: FU palpitations. Patient had outside echocardiogram in February 2016 that showed normal LV function and mild to moderate mitral regurgitation.  Repeat echocardiogram ordered at last office visit but not performed. Since last seen,  she denies dyspnea on exertion, orthopnea, PND, pedal edema, exertional chest pain or syncope.  Her palpitations are well controlled.  Current Outpatient Medications  Medication Sig Dispense Refill  . amoxicillin-clavulanate (AUGMENTIN) 875-125 MG tablet Take 1 tablet by mouth every 12 (twelve) hours. 14 tablet 0  . dicyclomine (BENTYL) 20 MG tablet Take 1 tablet (20 mg total) by mouth 2 (two) times daily. 20 tablet 0  . lisinopril (ZESTRIL) 5 MG tablet Take 1/2 tablet by mouth once daily for 2-3 days to reach BP goal of <140/90 on consistent basis; may increase to full tablet if well tolerated and needed to reach goal.    . Multiple Vitamin (MULTIVITAMIN WITH MINERALS) TABS tablet Take 1 tablet by mouth daily.    . Vitamins/Minerals TABS Take by mouth.     No current facility-administered medications for this visit.     Past Medical History:  Diagnosis Date  . Hypertension   . Palpitations     Past Surgical History:  Procedure Laterality Date  . GALLBLADDER SURGERY      Social History   Socioeconomic History  . Marital status: Married    Spouse name: Not on file  . Number of children: 2  . Years of education: Not on file  . Highest education level: Not on file  Occupational History    Comment: Hair dresser  Tobacco Use  . Smoking status: Never Smoker  . Smokeless tobacco: Never Used  Substance and Sexual Activity  . Alcohol use: Yes    Alcohol/week: 0.0 standard drinks    Comment: Glass wine per day  . Drug use: No  . Sexual activity: Not on file  Other Topics Concern  . Not on file  Social History Narrative  . Not on file   Social Determinants of Health   Financial Resource Strain:   . Difficulty of Paying Living  Expenses: Not on file  Food Insecurity:   . Worried About Charity fundraiser in the Last Year: Not on file  . Ran Out of Food in the Last Year: Not on file  Transportation Needs:   . Lack of Transportation (Medical): Not on file  . Lack of Transportation (Non-Medical): Not on file  Physical Activity:   . Days of Exercise per Week: Not on file  . Minutes of Exercise per Session: Not on file  Stress:   . Feeling of Stress : Not on file  Social Connections:   . Frequency of Communication with Friends and Family: Not on file  . Frequency of Social Gatherings with Friends and Family: Not on file  . Attends Religious Services: Not on file  . Active Member of Clubs or Organizations: Not on file  . Attends Archivist Meetings: Not on file  . Marital Status: Not on file  Intimate Partner Violence:   . Fear of Current or Ex-Partner: Not on file  . Emotionally Abused: Not on file  . Physically Abused: Not on file  . Sexually Abused: Not on file    Family History  Problem Relation Age of Onset  . Hypertension Mother   . Heart attack Mother   . Heart disease Mother        Atrial fibrillation  . Hypertension Brother   .  Stroke Maternal Grandmother     ROS: no fevers or chills, productive cough, hemoptysis, dysphasia, odynophagia, melena, hematochezia, dysuria, hematuria, rash, seizure activity, orthopnea, PND, pedal edema, claudication. Remaining systems are negative.  Physical Exam: Well-developed well-nourished in no acute distress.  Skin is warm and dry.  HEENT is normal.  Neck is supple.  Chest is clear to auscultation with normal expansion.  Cardiovascular exam is regular rate and rhythm.  Abdominal exam nontender or distended. No masses palpated. Extremities show no edema. neuro grossly intact  ECG-2021-sinus rhythm, RV conduction delay.  Personally reviewed  A/P  1 palpitations-continue beta-blocker at present dose.  Symptoms are well controlled.  2 history  of mild to moderate mitral regurgitation-repeat echocardiogram.  3 hypertension-blood pressure controlled.  Continue present medications.  Kirk Ruths, MD

## 2019-06-01 NOTE — Telephone Encounter (Signed)
Called pt no answer mail box was full and I wasn't able to leave a message. Will try to call back later

## 2019-06-02 NOTE — Telephone Encounter (Signed)
Called pt again left another message

## 2019-06-03 ENCOUNTER — Encounter: Payer: Self-pay | Admitting: Cardiology

## 2019-06-03 ENCOUNTER — Other Ambulatory Visit: Payer: Self-pay

## 2019-06-03 ENCOUNTER — Ambulatory Visit (INDEPENDENT_AMBULATORY_CARE_PROVIDER_SITE_OTHER): Payer: BC Managed Care – PPO | Admitting: Cardiology

## 2019-06-03 VITALS — BP 118/82 | HR 71 | Ht 67.0 in | Wt 157.8 lb

## 2019-06-03 DIAGNOSIS — I10 Essential (primary) hypertension: Secondary | ICD-10-CM

## 2019-06-03 DIAGNOSIS — I340 Nonrheumatic mitral (valve) insufficiency: Secondary | ICD-10-CM

## 2019-06-03 DIAGNOSIS — R002 Palpitations: Secondary | ICD-10-CM

## 2019-06-03 NOTE — Patient Instructions (Signed)
Medication Instructions:  NO CHANGE *If you need a refill on your cardiac medications before your next appointment, please call your pharmacy*  Lab Work: If you have labs (blood work) drawn today and your tests are completely normal, you will receive your results only by: Marland Kitchen MyChart Message (if you have MyChart) OR . A paper copy in the mail If you have any lab test that is abnormal or we need to change your treatment, we will call you to review the results.  Testing/Procedures: Your physician has requested that you have an echocardiogram. Echocardiography is a painless test that uses sound waves to create images of your heart. It provides your doctor with information about the size and shape of your heart and how well your heart's chambers and valves are working. This procedure takes approximately one hour. There are no restrictions for this procedure.[place]    Follow-Up: At [facility], you and your health needs are our priority.  As part of our continuing mission to provide you with exceptional heart care, we have created designated Provider Care Teams.  These Care Teams include your primary Cardiologist (physician) and Advanced Practice Providers (APPs -  Physician Assistants and Nurse Practitioners) who all work together to provide you with the care you need, when you need it.  Your next appointment:   12 month(s)  The format for your next appointment:   Either In Person or Virtual  Provider:   Kirk Ruths, MD

## 2019-06-08 DIAGNOSIS — K581 Irritable bowel syndrome with constipation: Secondary | ICD-10-CM | POA: Diagnosis not present

## 2019-06-08 DIAGNOSIS — I340 Nonrheumatic mitral (valve) insufficiency: Secondary | ICD-10-CM

## 2019-06-08 HISTORY — DX: Nonrheumatic mitral (valve) insufficiency: I34.0

## 2019-06-09 ENCOUNTER — Encounter: Payer: Self-pay | Admitting: Gastroenterology

## 2019-06-15 ENCOUNTER — Ambulatory Visit (HOSPITAL_BASED_OUTPATIENT_CLINIC_OR_DEPARTMENT_OTHER)
Admission: RE | Admit: 2019-06-15 | Discharge: 2019-06-15 | Disposition: A | Discharge status: Home / Self Care | Payer: BC Managed Care – PPO | Source: Ambulatory Visit | Attending: Cardiology | Admitting: Cardiology

## 2019-06-15 ENCOUNTER — Other Ambulatory Visit: Payer: Self-pay

## 2019-06-15 DIAGNOSIS — I340 Nonrheumatic mitral (valve) insufficiency: Secondary | ICD-10-CM | POA: Diagnosis not present

## 2019-06-15 NOTE — Progress Notes (Signed)
  Echocardiogram 2D Echocardiogram has been performed.  Cardell Peach 06/15/2019, 10:08 AM

## 2019-06-22 ENCOUNTER — Other Ambulatory Visit: Payer: Self-pay

## 2019-06-22 ENCOUNTER — Encounter: Payer: Self-pay | Admitting: Gastroenterology

## 2019-06-22 ENCOUNTER — Ambulatory Visit (INDEPENDENT_AMBULATORY_CARE_PROVIDER_SITE_OTHER): Payer: BC Managed Care – PPO | Admitting: Gastroenterology

## 2019-06-22 VITALS — BP 126/90 | HR 82 | Temp 98.0°F | Ht 67.0 in | Wt 159.5 lb

## 2019-06-22 DIAGNOSIS — K5900 Constipation, unspecified: Secondary | ICD-10-CM | POA: Diagnosis not present

## 2019-06-22 DIAGNOSIS — Z1211 Encounter for screening for malignant neoplasm of colon: Secondary | ICD-10-CM | POA: Diagnosis not present

## 2019-06-22 NOTE — Patient Instructions (Addendum)
Your provider has ordered Cologuard testing as an option for colon cancer screening. This is performed by [employer] and may be out of network with your insurance. PRIOR to completing the test, it is YOUR responsibility to contact your insurance about covered benefits for this test. Your out of pocket expense could be anywhere from $0.00 to $649.00.   When you call to check coverage with your insurer, please provide the following information:   -The ONLY provider of Cologuard is [place] code for Cologuard is [phone].  [employer] NPI # [id number]  -[employer] Tax ID # [id number]   We have already sent your demographic and insurance information to [employer] (phone number [phone]) and they should contact you within the next week regarding your test. If you have not heard from them within the next week, please call our office at [phone].   It was a pleasure to see you today!  Vito Cirigliano, D.O.

## 2019-06-22 NOTE — Progress Notes (Signed)
Chief Complaint: Abdominal pain, ER follow-up  Referring Provider:     Helyn Numbers, FNP   HPI:     Wendy Webb is a 54 y.o. female with a history of HTN and prior ccy,  referred to the Gastroenterology Clinic for evaluation of abdominal pain.  She was seen in the ER on 05/30/2019 with c/o MEG/RUQ pain.  Evaluation with normal CBC, CMP, lipase, hCG.  CT abdomen/pelvis: Bowel wall thickening and hyperenhancement of the jejunum with moderate volume ascites, reactive lymph nodes, c.w infectious/inflammatory enteritis.  Normal liver, pancreas, spleen.  Was prescribed a course of Augmentin and discharged home.    Was seen in follow-up by her PCM on 06/08/2019 with essentially resolution of abdominal pain at that time, with request for referral to GI.  At that time, also c/o chronic constipation (present for whole life) and was started on MiraLAX. Has tapered off Miralax and restarted OTC probiotic and high fiber diet.  Back to having 1 BM/day.  No abdominal pain. Otherwise, hasnt required laxatives, stool softeners, etc.   Today, she states she feels back to baseline.  No recurrence of abdominal pain as above.  Tolerating all p.o. intake.  Takes Pepcid since ER. Otherwise, no hx of reflux, dysphagia.  Does have periodic episodes of RUQ discomfort (not pain), which is fleeting and not lifestyle limiting.  Has been going on since ccy at age 62.  No prior EGD or Colonoscopy.   Mother with RA and "stomach issues", possibly UC vs IBS. Otherwise, no known FHx of CRC, GI malignancy, known IBD.     Past Medical History:  Diagnosis Date  . Hypertension   . Palpitations      Past Surgical History:  Procedure Laterality Date  . GALLBLADDER SURGERY     Family History  Problem Relation Age of Onset  . Hypertension Mother   . Heart attack Mother   . Heart disease Mother        Atrial fibrillation  . Hypertension Brother   . Stroke Maternal Grandmother   . Colon cancer Neg Hx    . Esophageal cancer Neg Hx    Social History   Tobacco Use  . Smoking status: Never Smoker  . Smokeless tobacco: Never Used  Substance Use Topics  . Alcohol use: Yes    Alcohol/week: 0.0 standard drinks    Comment: Glass wine per day  . Drug use: No   Current Outpatient Medications  Medication Sig Dispense Refill  . Famotidine (PEPCID AC PO) Take 1 tablet by mouth daily.    Marland Kitchen lisinopril (ZESTRIL) 5 MG tablet Take 1/2 tablet by mouth once daily for 2-3 days to reach BP goal of <140/90 on consistent basis; may increase to full tablet if well tolerated and needed to reach goal.    . Misc Natural Products (TART CHERRY ADVANCED PO) Take 1 tablet by mouth daily.    . Multiple Vitamin (MULTIVITAMIN WITH MINERALS) TABS tablet Take 1 tablet by mouth daily.    . Probiotic Product (PROBIOTIC PO) Take 1 tablet by mouth daily.     No current facility-administered medications for this visit.   Allergies  Allergen Reactions  . Shellfish Allergy Nausea And Vomiting     Review of Systems: All systems reviewed and negative except where noted in HPI.     Physical Exam:    Wt Readings from Last 3 Encounters:  06/22/19 159 lb 8 oz (72.3 kg)  06/03/19 157 lb 12.8 oz (71.6 kg)  05/30/19 160 lb (72.6 kg)    BP 126/90   Pulse 82   Temp 98 F (36.7 C)   Ht 5' 7"  (1.702 m)   Wt 159 lb 8 oz (72.3 kg)   BMI 24.98 kg/m  Constitutional:  Pleasant, in no acute distress. Psychiatric: Normal mood and affect. Behavior is normal. EENT: Pupils normal.  Conjunctivae are normal. No scleral icterus. Neck supple. No cervical LAD. Cardiovascular: Normal rate, regular rhythm. No edema Pulmonary/chest: Effort normal and breath sounds normal. No wheezing, rales or rhonchi. Abdominal: Soft, nondistended, nontender. Bowel sounds active throughout. There are no masses palpable. No hepatomegaly. Neurological: Alert and oriented to person place and time. Skin: Skin is warm and dry. No rashes noted.    ASSESSMENT AND PLAN;   1) Colon cancer screening: -Due for age-appropriate, average risk CRC screening.  Discussed options at length today, to include optical colonoscopy, virtual colonoscopy, FIT kit testing, Cologuard, and she strongly prefer Cologuard  2) Abdominal pain -Resolved -No history of reflux, so can discontinue Pepcid  3) Chronic constipation: -Longstanding history of chronic constipation for multiple decades.  Seemingly well controlled since restarting probiotics and high-fiber diet.  Has since tapered off laxatives and maintaining 1 BM/day -Continue adequate hydration, high-fiber diet -Okay to resume probiotic  4) Ascites noted on CT -Reported moderate volume ascites on recent CT, but the setting of enteritis.  Per radiologist, appearance consistent with inflammation.  Otherwise smooth liver contour on CT, normal liver enzymes, creatinine, albumin, platelets.  No ascites on exam today -Observation for now.  If any abdominal symptoms, consider abdominal ultrasound  RTC as needed  I spent 45 minutes of time, including in depth chart review, independent review of results as outlined above, communicating results with the patient directly, face-to-face time with the patient, coordinating care, ordering studies and medications as appropriate, and documentation.     Lavena Bullion, DO, FACG  06/22/2019, 10:29 AM   Wendy Alar, NP

## 2019-06-30 ENCOUNTER — Ambulatory Visit: Payer: BC Managed Care – PPO | Admitting: Family

## 2019-06-30 ENCOUNTER — Encounter: Payer: Self-pay | Admitting: Family

## 2019-06-30 ENCOUNTER — Other Ambulatory Visit: Payer: Self-pay

## 2019-06-30 ENCOUNTER — Encounter: Payer: Self-pay | Admitting: Gastroenterology

## 2019-06-30 VITALS — BP 122/66 | HR 72 | Temp 97.1°F | Resp 16 | Ht 67.0 in | Wt 156.0 lb

## 2019-06-30 DIAGNOSIS — K581 Irritable bowel syndrome with constipation: Secondary | ICD-10-CM

## 2019-06-30 DIAGNOSIS — Z1212 Encounter for screening for malignant neoplasm of rectum: Secondary | ICD-10-CM | POA: Diagnosis not present

## 2019-06-30 DIAGNOSIS — Z1211 Encounter for screening for malignant neoplasm of colon: Secondary | ICD-10-CM | POA: Diagnosis not present

## 2019-06-30 DIAGNOSIS — I340 Nonrheumatic mitral (valve) insufficiency: Secondary | ICD-10-CM

## 2019-06-30 DIAGNOSIS — I10 Essential (primary) hypertension: Secondary | ICD-10-CM | POA: Insufficient documentation

## 2019-06-30 DIAGNOSIS — R002 Palpitations: Secondary | ICD-10-CM | POA: Diagnosis not present

## 2019-06-30 DIAGNOSIS — K589 Irritable bowel syndrome without diarrhea: Secondary | ICD-10-CM | POA: Insufficient documentation

## 2019-06-30 LAB — COLOGUARD

## 2019-06-30 NOTE — Progress Notes (Signed)
Subjective:    Patient ID: Wendy Webb, female    DOB: June 26, 1965, 54 y.o.   MRN: [id number]  HPI  Patient is a 54 yr old female who presents today to establish care.  pmhx is significant for the following:  Palpitations- maintained on beta blocker, followed by cardiology   mitral regurgitation- followed by cardiology.  Previous echo noted moderate regurgitation. Most recent noted mild. Denies SOB.  HTN- She is on 2.5mg  of lisinopril once daily.  BP Readings from Last 3 Encounters:  06/30/19 122/66  06/22/19 126/90  06/03/19 118/82   Reports that she exercises daily.  Wt Readings from Last 3 Encounters:  06/30/19 156 lb (70.8 kg)  06/22/19 159 lb 8 oz (72.3 kg)  06/03/19 157 lb 12.8 oz (71.6 kg)   Palpitations- saw cardiology for this. Notes that symptoms were worse during perimenopause. Currently well controlled.   IBS-chronic constipation. Reports that she started a probiotic and this seems to help  She is taking otc pepcid not helping.      Review of Systems  Constitutional: Negative for unexpected weight change.  HENT: Negative for hearing loss and rhinorrhea.   Eyes: Negative for visual disturbance.  Respiratory: Positive for cough (dry cough due to lisinopril). Negative for shortness of breath.   Cardiovascular: Negative for chest pain.  Gastrointestinal: Negative for constipation (stays constipated) and diarrhea.  Genitourinary: Negative for dysuria and frequency.  Musculoskeletal: Negative for arthralgias and myalgias.  Skin: Negative for rash.  Neurological: Negative for headaches.  Hematological: Negative for adenopathy.  Psychiatric/Behavioral:       Denies depression or anxiety   Past Medical History:  Diagnosis Date  . Hypertension   . Palpitations      Social History   Socioeconomic History  . Marital status: Married    Spouse name: Not on file  . Number of children: 2  . Years of education: Not on file  . Highest education level: Not  on file  Occupational History    Comment: Hair dresser  Tobacco Use  . Smoking status: Never Smoker  . Smokeless tobacco: Never Used  Substance and Sexual Activity  . Alcohol use: Yes    Alcohol/week: 0.0 standard drinks    Comment: Glass wine per day  . Drug use: No  . Sexual activity: Yes    Partners: Male  Other Topics Concern  . 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 Difficulty of Paying Living Expenses:   Food Insecurity:   . Worried About Charity fundraiser in the Last Year:   . Arboriculturist in the Last Year:   Transportation Needs:   . Film/video editor (Medical):   Marland Kitchen Lack of Transportation (Non-Medical):   Physical Activity:   . Days of Exercise per Week:   . Minutes of Exercise per Session:   Stress:   . Feeling of Stress :   Social Connections:   . Frequency of Communication with Friends and Family:   . Frequency of Social Gatherings with Friends and Family:   . Attends Religious Services:   . Active Member of Clubs or Organizations:   . Attends Archivist Meetings:   Marland Kitchen Marital Status:  Intimate Partner Violence:   . Fear of Current or Ex-Partner:   . Emotionally Abused:   Marland Kitchen Physically Abused:   . Sexually Abused:     Past Surgical History:  Procedure Laterality Date  . GALLBLADDER SURGERY      Family History  Problem Relation Age of Onset  . Hypertension Mother   . Heart attack Mother   . Heart disease Mother        Atrial fibrillation  . Obstructive Sleep Apnea Mother   . Hypertension Brother   . Stroke Maternal Grandmother 3  . Colon cancer Neg Hx   . Esophageal cancer Neg Hx     Allergies  Allergen Reactions  . Shellfish Allergy Nausea And Vomiting    Current Outpatient Medications on File Prior  to Visit  Medication Sig Dispense Refill  . Famotidine (PEPCID AC PO) Take 1 tablet by mouth daily.    Marland Kitchen lisinopril (ZESTRIL) 5 MG tablet Take 1/2 tablet by mouth once daily for 2-3 days to reach BP goal of <140/90 on consistent basis; may increase to full tablet if well tolerated and needed to reach goal.    . Misc Natural Products (TART CHERRY ADVANCED PO) Take 1 tablet by mouth daily.    . Multiple Vitamin (MULTIVITAMIN WITH MINERALS) TABS tablet Take 1 tablet by mouth daily.    . Probiotic Product (PROBIOTIC PO) Take 1 tablet by mouth daily.     No current facility-administered medications on file prior to visit.    BP 122/66 (BP Location: Left Arm, Patient Position: Sitting, Cuff Size: Small)   Pulse 72   Temp (!) 97.1 F (36.2 C) (Temporal)   Resp 16   Ht 5\' 7"  (1.702 m)   Wt 156 lb (70.8 kg)   SpO2 100%   BMI 24.43 kg/m       Objective:   Physical Exam Constitutional:      Appearance: She is well-developed.  Neck:     Thyroid: No thyromegaly.  Cardiovascular:     Rate and Rhythm: Normal rate and regular rhythm.     Heart sounds: Normal heart sounds. No murmur.  Pulmonary:     Effort: Pulmonary effort is normal. No respiratory distress.     Breath sounds: Normal breath sounds. No wheezing.  Abdominal:     General: Bowel sounds are normal.     Palpations: Abdomen is soft.     Tenderness: There is no abdominal tenderness.  Musculoskeletal:     Cervical back: Neck supple.  Skin:    General: Skin is warm and dry.  Neurological:     Mental Status: She is alert and oriented to person, place, and time.  Psychiatric:        Behavior: Behavior normal.        Thought Content: Thought content normal.        Judgment: Judgment normal.           Assessment & Plan:  HTN- bp looks great. She reports home DBP is usually around 90. Will continue to monitor bp.  IBS- stable with addition of probiotic.  Mild mitral regurg- asymptomatic, managed by cardiology.   Hx  of palpitations- currently stable.  This visit occurred during the SARS-CoV-2 public health emergency.  Safety protocols were in place, including screening questions prior to the visit, additional usage of staff PPE, and extensive cleaning of exam room while observing appropriate contact time as indicated for disinfecting solutions.   35 minutes spent on today's visit reviewing  medical record, counseling pt and examining patient.

## 2019-07-04 LAB — COLOGUARD: COLOGUARD: NEGATIVE

## 2019-07-06 DIAGNOSIS — Z1239 Encounter for other screening for malignant neoplasm of breast: Secondary | ICD-10-CM | POA: Diagnosis not present

## 2019-07-06 DIAGNOSIS — R928 Other abnormal and inconclusive findings on diagnostic imaging of breast: Secondary | ICD-10-CM | POA: Diagnosis not present

## 2019-07-06 DIAGNOSIS — Z1231 Encounter for screening mammogram for malignant neoplasm of breast: Secondary | ICD-10-CM | POA: Diagnosis not present

## 2019-07-27 DIAGNOSIS — H5203 Hypermetropia, bilateral: Secondary | ICD-10-CM | POA: Diagnosis not present

## 2019-10-05 ENCOUNTER — Ambulatory Visit: Payer: BC Managed Care – PPO | Admitting: Family

## 2019-10-19 ENCOUNTER — Ambulatory Visit: Payer: BC Managed Care – PPO | Admitting: Family

## 2019-10-26 ENCOUNTER — Encounter: Payer: Self-pay | Admitting: Family

## 2019-10-26 ENCOUNTER — Other Ambulatory Visit: Payer: Self-pay

## 2019-10-26 ENCOUNTER — Ambulatory Visit: Payer: BC Managed Care – PPO | Admitting: Family

## 2019-10-26 ENCOUNTER — Telehealth: Payer: Self-pay | Admitting: Family

## 2019-10-26 VITALS — BP 137/78 | HR 81 | Temp 97.1°F | Resp 16 | Ht 67.0 in | Wt 157.0 lb

## 2019-10-26 DIAGNOSIS — K581 Irritable bowel syndrome with constipation: Secondary | ICD-10-CM

## 2019-10-26 DIAGNOSIS — K219 Gastro-esophageal reflux disease without esophagitis: Secondary | ICD-10-CM

## 2019-10-26 DIAGNOSIS — I10 Essential (primary) hypertension: Secondary | ICD-10-CM | POA: Diagnosis not present

## 2019-10-26 MED ORDER — OMEPRAZOLE 20 MG PO CPDR: 20.0000 mg | DELAYED_RELEASE_CAPSULE | Freq: Every day | ORAL | 3 refills | Status: DC

## 2019-10-26 NOTE — Telephone Encounter (Signed)
See mychart.  

## 2019-10-26 NOTE — Patient Instructions (Signed)
Please check blood pressure once daily for 1 week, and then send me your readings via mychart.

## 2019-10-26 NOTE — Progress Notes (Signed)
Subjective:    Patient ID: Wendy Webb, female    DOB: Oct 25, 1965, 54 y.o.   MRN: [id number]  HPI  Patient is a 54 yr old female who presents today for follow up.  HTN- no longer on lisinopril- she stopped after our last visit.   BP Readings from Last 3 Encounters:  10/26/19 (!) 146/90  06/30/19 122/66  06/22/19 126/90   IBS- ran out of probiotic, reports symptoms remain well controlled.   GERD- reports less gas/bloating since she started omeprazole.    Review of Systems See HPI  Past Medical History:  Diagnosis Date  . Hypertension   . IBS (irritable bowel syndrome)   . Mild mitral regurgitation 06/2019  . Palpitations      Social History   Socioeconomic History  . Marital status: Married    Spouse name: Not on file  . Number of children: 2  . Years of education: Not on file  . Highest education level: Not on file  Occupational History    Comment: Hair dresser  Tobacco Use  . Smoking status: Never Smoker  . Smokeless tobacco: Never Used  Vaping Use  . Vaping Use: Never used  Substance and Sexual Activity  . Alcohol use: Yes    Alcohol/week: 0.0 standard drinks    Comment: Glass wine per day  . Drug use: No  . Sexual activity: Yes    Partners: Male  Other Topics Concern  . 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 Difficulty of Paying Living Expenses:   Food Insecurity:   . Worried About Charity fundraiser in the Last Year:   . Arboriculturist in the Last Year:   Transportation Needs:   . Film/video editor (Medical):   Marland Kitchen Lack of Transportation (Non-Medical):   Physical Activity:   . Days of Exercise per Week:   . Minutes of Exercise per Session:   Stress:   . Feeling of Stress :   Social Connections:   . Frequency of  Communication with Friends and Family:   . Frequency of Social Gatherings with Friends and Family:   . Attends Religious Services:   . Active Member of Clubs or Organizations:   . Attends Archivist Meetings:   Marland Kitchen Marital Status:   Intimate Partner Violence:   . Fear of Current or Ex-Partner:   . Emotionally Abused:   Marland Kitchen Physically Abused:   . Sexually Abused:     Past Surgical History:  Procedure Laterality Date  . GALLBLADDER SURGERY      Family History  Problem Relation Age of Onset  . Hypertension Mother   . Heart attack Mother   . Heart disease Mother        Atrial fibrillation  . Obstructive Sleep Apnea Mother   . Hypertension Brother   . Stroke Maternal Grandmother 65  . Colon cancer Neg Hx   . Esophageal cancer Neg Hx     Allergies  Allergen Reactions  . Shellfish Allergy Nausea And Vomiting    Current Outpatient Medications on File Prior to Visit  Medication Sig Dispense Refill  . Famotidine (PEPCID AC PO) Take 1 tablet by mouth daily.    Marland Kitchen  Misc Natural Products (TART CHERRY ADVANCED PO) Take 1 tablet by mouth daily.    . Multiple Vitamin (MULTIVITAMIN WITH MINERALS) TABS tablet Take 1 tablet by mouth daily.    . Probiotic Product (PROBIOTIC PO) Take 1 tablet by mouth daily.     No current facility-administered medications on file prior to visit.    BP (!) 146/90 (BP Location: Right Arm, Patient Position: Sitting, Cuff Size: Normal)   Pulse 81   Temp (!) 97.1 F (36.2 C) (Temporal)   Resp 16   Ht 5\' 7"  (1.702 m)   Wt 157 lb (71.2 kg)   SpO2 98%   BMI 24.59 kg/m       Objective:   Physical Exam Constitutional:      Appearance: She is well-developed.  Cardiovascular:     Rate and Rhythm: Normal rate and regular rhythm.     Heart sounds: Normal heart sounds. No murmur heard.   Pulmonary:     Effort: Pulmonary effort is normal. No respiratory distress.     Breath sounds: Normal breath sounds. No wheezing.  Psychiatric:         Behavior: Behavior normal.        Thought Content: Thought content normal.        Judgment: Judgment normal.           Assessment & Plan:  HTN- initial bp was elevated, but follow up bp check is better. Pt stopped her lisinopril 2.5 mg on her own.  (she had a dry cough on this medication). I have asked her to check bp at home once daily for 1 week and then send me her readings via my chart. Advised pt  OK to remain off of lisinopril.    IBS- stable without probiotic. Monitor.   GERD- stable on omeprazole.  continue same.   This visit occurred during the SARS-CoV-2 public health emergency.  Safety protocols were in place, including screening questions prior to the visit, additional usage of staff PPE, and extensive cleaning of exam room while observing appropriate contact time as indicated for disinfecting solutions.

## 2019-10-28 ENCOUNTER — Telehealth: Payer: Self-pay | Admitting: Family

## 2019-10-28 NOTE — Telephone Encounter (Signed)
Patient said that she was told to monitor her blood pressure. She said that it has been staying in the 136/86 range. Please give her a call back at [phone] and advise.

## 2019-10-28 NOTE — Telephone Encounter (Signed)
Please advise pt that I think it is OK for her to remain off lisinopril. Follow up in 3 months.

## 2019-10-29 NOTE — Telephone Encounter (Signed)
Patient advised to stay off Lisinopril and follow up in 3 months.

## 2019-11-30 DIAGNOSIS — M2550 Pain in unspecified joint: Secondary | ICD-10-CM | POA: Diagnosis not present

## 2019-11-30 DIAGNOSIS — Z0000 Encounter for general adult medical examination without abnormal findings: Secondary | ICD-10-CM | POA: Diagnosis not present

## 2019-11-30 DIAGNOSIS — Z8616 Personal history of COVID-19: Secondary | ICD-10-CM | POA: Diagnosis not present

## 2020-05-02 DIAGNOSIS — D1801 Hemangioma of skin and subcutaneous tissue: Secondary | ICD-10-CM | POA: Diagnosis not present

## 2020-05-02 DIAGNOSIS — L814 Other melanin hyperpigmentation: Secondary | ICD-10-CM | POA: Diagnosis not present

## 2020-05-02 DIAGNOSIS — L821 Other seborrheic keratosis: Secondary | ICD-10-CM | POA: Diagnosis not present

## 2020-05-02 DIAGNOSIS — D225 Melanocytic nevi of trunk: Secondary | ICD-10-CM | POA: Diagnosis not present

## 2021-01-02 DIAGNOSIS — Z0000 Encounter for general adult medical examination without abnormal findings: Secondary | ICD-10-CM | POA: Diagnosis not present

## 2021-01-02 DIAGNOSIS — Z1389 Encounter for screening for other disorder: Secondary | ICD-10-CM | POA: Diagnosis not present

## 2021-01-02 DIAGNOSIS — Z1331 Encounter for screening for depression: Secondary | ICD-10-CM | POA: Diagnosis not present

## 2021-04-09 DIAGNOSIS — C50919 Malignant neoplasm of unspecified site of unspecified female breast: Secondary | ICD-10-CM

## 2021-04-09 HISTORY — DX: Malignant neoplasm of unspecified site of unspecified female breast: C50.919

## 2021-09-14 ENCOUNTER — Other Ambulatory Visit: Payer: Self-pay | Admitting: Physician Assistant

## 2021-09-14 DIAGNOSIS — N6310 Unspecified lump in the right breast, unspecified quadrant: Secondary | ICD-10-CM

## 2021-09-25 ENCOUNTER — Ambulatory Visit
Admission: RE | Admit: 2021-09-25 | Discharge: 2021-09-25 | Disposition: A | Discharge status: Home / Self Care | Payer: BC Managed Care – PPO | Source: Ambulatory Visit | Attending: Physician Assistant | Admitting: Physician Assistant

## 2021-09-25 ENCOUNTER — Other Ambulatory Visit: Payer: Self-pay | Admitting: Physician Assistant

## 2021-09-25 DIAGNOSIS — N6310 Unspecified lump in the right breast, unspecified quadrant: Secondary | ICD-10-CM

## 2021-09-26 ENCOUNTER — Ambulatory Visit
Admission: RE | Admit: 2021-09-26 | Discharge: 2021-09-26 | Disposition: A | Discharge status: Home / Self Care | Payer: BC Managed Care – PPO | Source: Ambulatory Visit | Attending: Physician Assistant | Admitting: Physician Assistant

## 2021-09-26 DIAGNOSIS — N6310 Unspecified lump in the right breast, unspecified quadrant: Secondary | ICD-10-CM

## 2021-09-27 ENCOUNTER — Encounter: Payer: Self-pay | Admitting: Family

## 2021-09-28 ENCOUNTER — Telehealth: Payer: Self-pay | Admitting: Hematology and Oncology

## 2021-09-28 ENCOUNTER — Encounter: Payer: Self-pay | Admitting: *Deleted

## 2021-09-28 DIAGNOSIS — Z171 Estrogen receptor negative status [ER-]: Secondary | ICD-10-CM

## 2021-09-28 DIAGNOSIS — C50411 Malignant neoplasm of upper-outer quadrant of right female breast: Secondary | ICD-10-CM

## 2021-09-28 NOTE — Telephone Encounter (Signed)
Spoke to patient to confirm morning clinic appointment for 6/28, paperwork sent via e-mail

## 2021-10-02 NOTE — Progress Notes (Signed)
Radiation Oncology         [phone]) [phone] ________________________________  Name: Wendy Webb        MRN: [id number]  Date of Service: 10/04/2021 DOB: 11-20-1965  CC:O'Sullivan, Lenna Sciara, NP  Jovita Kussmaul, MD     REFERRING PHYSICIAN: [name], MD   DIAGNOSIS: The encounter diagnosis was Malignant neoplasm of upper-outer quadrant of right breast in female, estrogen receptor negative ([place]).   HISTORY OF PRESENT ILLNESS: Wendy Webb is a 56 y.o. female seen in the multidisciplinary breast clinic for a new diagnosis of right breast cancer. The patient was noted to have a palpable lump in the right breast.  This was noted in the upper right breast and by ultrasound was located in the 12 o'clock position measuring up to 2.5 cm.  No evidence of any axillary adenopathy was present.  She underwent a biopsy on 09/26/2021 that showed grade 3 invasive ductal carcinoma of the right breast.  Her tumor was ER/PR negative, HER2 was equivocal and reflexed to negative.  She is seen today to discuss treatment recommendations of her cancer.   PREVIOUS RADIATION THERAPY: No   PAST MEDICAL HISTORY:  Past Medical History:  Diagnosis Date   Hypertension    IBS (irritable bowel syndrome)    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[place])    09/26/2021 (right breast biopsy)   Mild mitral regurgitation 06/2019   Palpitations        PAST SURGICAL HISTORY: Past Surgical History:  Procedure Laterality Date   GALLBLADDER SURGERY       FAMILY HISTORY:  Family History  Problem Relation Age of Onset   Hypertension Mother    Heart attack Mother    Heart disease Mother        Atrial fibrillation   Obstructive Sleep Apnea Mother    Hypertension Brother    Stroke Maternal Grandmother 75   Colon cancer Neg Hx    Esophageal cancer Neg Hx      SOCIAL HISTORY:  reports that she has never smoked. She has never used smokeless tobacco. She reports current alcohol use. She reports that she does not use  drugs. The patient is married and lives in [place]. She is a Theme park manager.   ALLERGIES: Ace inhibitors and Shellfish allergy   MEDICATIONS:  Current Outpatient Medications  Medication Sig Dispense Refill   Misc Natural Products (TART CHERRY ADVANCED PO) Take 1 tablet by mouth daily.     Multiple Vitamin (MULTIVITAMIN WITH MINERALS) TABS tablet Take 1 tablet by mouth daily.     omeprazole (PRILOSEC) 20 MG capsule Take 1 capsule (20 mg total) by mouth daily. 30 capsule 3   Probiotic Product (PROBIOTIC PO) Take 1 tablet by mouth daily.     No current facility-administered medications for this visit.     REVIEW OF SYSTEMS: On review of systems, the patient reports that she is doing well and is trying to navigate through this information of her cancer. No breast specific complaints are noted.      PHYSICAL EXAM:  Wt Readings from Last 3 Encounters:  10/26/19 157 lb (71.2 kg)  06/30/19 156 lb (70.8 kg)  06/22/19 159 lb 8 oz (72.3 kg)   Temp Readings from Last 3 Encounters:  10/26/19 (!) 97.1 F (36.2 C) (Temporal)  06/30/19 (!) 97.1 F (36.2 C) (Temporal)  06/22/19 98 F (36.7 C)   BP Readings from Last 3 Encounters:  10/26/19 137/78  06/30/19 122/66  06/22/19 126/90   Pulse Readings from  Last 3 Encounters:  10/26/19 81  06/30/19 72  06/22/19 82    In general this is a well appearing caucasian female in no acute distress. She's alert and oriented x4 and appropriate throughout the examination. Cardiopulmonary assessment is negative for acute distress and she exhibits normal effort. Bilateral breast exam is deferred.    ECOG = 1  0 - Asymptomatic (Fully active, able to carry on all predisease activities without restriction)  1 - Symptomatic but completely ambulatory (Restricted in physically strenuous activity but ambulatory and able to carry out work of a light or sedentary nature. For example, light housework, office work)  2 - Symptomatic, <50% in bed during the  day (Ambulatory and capable of all self care but unable to carry out any work activities. Up and about more than 50% of waking hours)  3 - Symptomatic, >50% in bed, but not bedbound (Capable of only limited self-care, confined to bed or chair 50% or more of waking hours)  4 - Bedbound (Completely disabled. Cannot carry on any self-care. Totally confined to bed or chair)  5 - Death   Eustace Pen MM, Creech RH, Tormey DC, et al. [phone]). "Toxicity and response criteria of the [facility] Group". [place] Oncol. 5 (6): 649-55    LABORATORY DATA:  Lab Results  Component Value Date   WBC 8.9 05/30/2019   HGB 13.2 05/30/2019   HCT 40.2 05/30/2019   MCV 97.1 05/30/2019   PLT 250 05/30/2019   Lab Results  Component Value Date   NA 135 05/30/2019   K 4.0 05/30/2019   CL 102 05/30/2019   CO2 26 05/30/2019   Lab Results  Component Value Date   ALT 15 05/30/2019   AST 16 05/30/2019   ALKPHOS 39 05/30/2019   BILITOT 0.3 05/30/2019      RADIOGRAPHY: [place] RT BREAST BX W LOC DEV 1ST LESION IMG BX SPEC US GUIDE  Addendum Date: 09/29/2021   ADDENDUM REPORT: 09/28/2021 22:34 ADDENDUM: Pathology revealed GRADE III INVASIVE DUCTAL CARCINOMA, GEOGRAPHIC NECROSIS of the RIGHT breast, 12 o'clock, (ribbon clip). This was found to be concordant by Dr. Lovey Newcomer. Pathology results were discussed with the patient by telephone. The patient reported doing well after the biopsy with tenderness at the site. Post biopsy instructions and care were reviewed and questions were answered. The patient was encouraged to call [facility] for any additional concerns. My direct phone number was provided. The patient was referred to [facility] on October 04, 2021. Pathology results reported by Terie Purser, RN on 09/27/2021. Electronically Signed   By: Lovey Newcomer M.D.   On: 09/28/2021 22:34   Result Date:  09/28/2021 CLINICAL DATA:  Patient with suspicious right breast mass EXAM: ULTRASOUND GUIDED RIGHT BREAST CORE NEEDLE BIOPSY COMPARISON:  None Available. PROCEDURE: I met with the patient and we discussed the procedure of ultrasound-guided biopsy, including benefits and alternatives. We discussed the high likelihood of a successful procedure. We discussed the risks of the procedure, including infection, bleeding, tissue injury, clip migration, and inadequate sampling. Informed written consent was given. The usual time-out protocol was performed immediately prior to the procedure. Lesion quadrant: Upper outer quadrant Using sterile technique and 1% Lidocaine as local anesthetic, under direct ultrasound visualization, a 14 gauge spring-loaded device was used to perform biopsy of right breast mass 12 o'clock position using a lateral approach. At the conclusion of the procedure  ribbon shaped tissue marker clip was deployed into the biopsy cavity. Follow up 2 view mammogram was performed and dictated separately. IMPRESSION: Ultrasound guided biopsy of right breast mass 12 o'clock position. No apparent complications. Electronically Signed: By: Lovey Newcomer M.D. On: 09/26/2021 10:04  MM CLIP PLACEMENT RIGHT  Result Date: 09/26/2021 CLINICAL DATA:  Status post ultrasound-guided biopsy right breast mass. EXAM: 3D DIAGNOSTIC RIGHT MAMMOGRAM POST ULTRASOUND BIOPSY COMPARISON:  Previous exam(s). FINDINGS: 3D Mammographic images were obtained following ultrasound guided biopsy of right breast mass. The biopsy marking clip is in expected position at the site of biopsy. IMPRESSION: Appropriate positioning of the ribbon shaped biopsy marking clip at the site of biopsy in the right breast. Final Assessment: Post Procedure Mammograms for Marker Placement Electronically Signed   By: Lovey Newcomer M.D.   On: 09/26/2021 10:46  MM DIAG BREAST TOMO BILATERAL  Result Date: 09/25/2021 CLINICAL DATA:  Patient describes a palpable lump  within the RIGHT breast. EXAM: DIGITAL DIAGNOSTIC BILATERAL MAMMOGRAM WITH TOMOSYNTHESIS AND CAD; ULTRASOUND RIGHT BREAST LIMITED TECHNIQUE: Bilateral digital diagnostic mammography and breast tomosynthesis was performed. The images were evaluated with computer-aided detection.; Targeted ultrasound examination of the right breast was performed COMPARISON:  Previous exam(s). ACR Breast Density Category c: The breast tissue is heterogeneously dense, which may obscure small masses. FINDINGS: There is an irregular partially calcified mass within the upper RIGHT breast, 12 o'clock axis region, at posterior depth, measuring approximately 2.6 cm greatest dimension. There are no new dominant masses, suspicious calcifications or secondary signs of malignancy elsewhere within the RIGHT breast. There are no new dominant masses, suspicious calcifications or secondary signs of malignancy identified within the LEFT breast. Targeted ultrasound is performed, showing an irregular hypoechoic mass in the RIGHT breast at the 12 o'clock axis, 5 cm from the nipple, measuring 2.5 x 1.8 x 1.7 cm, corresponding to the palpable area of concern and corresponding mammographic finding. RIGHT axilla was evaluated with ultrasound showing no enlarged or morphologically abnormal lymph nodes. IMPRESSION: Irregular hypoechoic mass in the RIGHT breast at the 12 o'clock axis, 5 cm from the nipple, measuring 2.5 cm, corresponding to the palpable area of concern. Ultrasound-guided biopsy is recommended. RECOMMENDATION: Ultrasound-guided biopsy for the RIGHT breast mass at the 12 o'clock axis. Ultrasound-guided biopsy is scheduled on June 20th. I have discussed the findings and recommendations with the patient. If applicable, a reminder letter will be sent to the patient regarding the next appointment. BI-RADS CATEGORY  4: Suspicious. Electronically Signed   By: Franki Cabot M.D.   On: 09/25/2021 10:23  US BREAST LTD UNI RIGHT INC AXILLA  Result  Date: 09/25/2021 CLINICAL DATA:  Patient describes a palpable lump within the RIGHT breast. EXAM: DIGITAL DIAGNOSTIC BILATERAL MAMMOGRAM WITH TOMOSYNTHESIS AND CAD; ULTRASOUND RIGHT BREAST LIMITED TECHNIQUE: Bilateral digital diagnostic mammography and breast tomosynthesis was performed. The images were evaluated with computer-aided detection.; Targeted ultrasound examination of the right breast was performed COMPARISON:  Previous exam(s). ACR Breast Density Category c: The breast tissue is heterogeneously dense, which may obscure small masses. FINDINGS: There is an irregular partially calcified mass within the upper RIGHT breast, 12 o'clock axis region, at posterior depth, measuring approximately 2.6 cm greatest dimension. There are no new dominant masses, suspicious calcifications or secondary signs of malignancy elsewhere within the RIGHT breast. There are no new dominant masses, suspicious calcifications or secondary signs of malignancy identified within the LEFT breast. Targeted ultrasound is performed, showing an irregular hypoechoic mass in the RIGHT breast at the  12 o'clock axis, 5 cm from the nipple, measuring 2.5 x 1.8 x 1.7 cm, corresponding to the palpable area of concern and corresponding mammographic finding. RIGHT axilla was evaluated with ultrasound showing no enlarged or morphologically abnormal lymph nodes. IMPRESSION: Irregular hypoechoic mass in the RIGHT breast at the 12 o'clock axis, 5 cm from the nipple, measuring 2.5 cm, corresponding to the palpable area of concern. Ultrasound-guided biopsy is recommended. RECOMMENDATION: Ultrasound-guided biopsy for the RIGHT breast mass at the 12 o'clock axis. Ultrasound-guided biopsy is scheduled on June 20th. I have discussed the findings and recommendations with the patient. If applicable, a reminder letter will be sent to the patient regarding the next appointment. BI-RADS CATEGORY  4: Suspicious. Electronically Signed   By: Franki Cabot M.D.   On:  09/25/2021 10:23      IMPRESSION/PLAN: 1. Stage IIB, cT2N0M0, grade 3, triple negative invasive ductal carcinoma of the right breast. Dr. Lisbeth Renshaw discusses the pathology findings and reviews the nature of triple negative breast disease. The consensus from the breast conference includes neoadjuvant chemotherapy followed by breast conservation with lumpectomy with sentinel node biopsy.  Dr. Lisbeth Renshaw discusses the rationale for external radiotherapy to the breast  to reduce risks of local recurrence followed by antiestrogen therapy. We discussed the risks, benefits, short, and long term effects of radiotherapy, as well as the curative intent, and the patient is interested in proceeding. Dr. Lisbeth Renshaw discusses the delivery and logistics of radiotherapy and anticipates a course of 4 or up to 6 1/2 weeks of radiotherapy to the right breast..We will see her back a few weeks after surgery and subsequently proceed with treatment planning thereafter. 2. Possible genetic predisposition to malignancy. The patient is a candidate for genetic testing given her personal  history. She was offered referral and will meet genetics today.   In a visit lasting 60 minutes, greater than 50% of the time was spent face to face reviewing her case, as well as in preparation of, discussing, and coordinating the patient's care.  The above documentation reflects my direct findings during this shared patient visit. Please see the separate note by Dr. Lisbeth Renshaw on this date for the remainder of the patient's plan of care.    Carola Rhine, [facility]    **Disclaimer: This note was dictated with voice recognition software. Similar sounding words can inadvertently be transcribed and this note may contain transcription errors which may not have been corrected upon publication of note.**

## 2021-10-04 ENCOUNTER — Inpatient Hospital Stay: Payer: BC Managed Care – PPO

## 2021-10-04 ENCOUNTER — Ambulatory Visit: Payer: BC Managed Care – PPO | Attending: General Surgery | Admitting: Physical Therapy

## 2021-10-04 ENCOUNTER — Encounter: Payer: Self-pay | Admitting: Hematology and Oncology

## 2021-10-04 ENCOUNTER — Ambulatory Visit: Payer: Self-pay | Admitting: General Surgery

## 2021-10-04 ENCOUNTER — Ambulatory Visit
Admission: RE | Admit: 2021-10-04 | Discharge: 2021-10-04 | Disposition: A | Discharge status: Home / Self Care | Payer: BC Managed Care – PPO | Source: Ambulatory Visit | Attending: Radiation Oncology | Admitting: Radiation Oncology

## 2021-10-04 ENCOUNTER — Encounter: Payer: Self-pay | Admitting: *Deleted

## 2021-10-04 ENCOUNTER — Inpatient Hospital Stay: Payer: BC Managed Care – PPO | Attending: Hematology and Oncology | Admitting: Hematology and Oncology

## 2021-10-04 ENCOUNTER — Inpatient Hospital Stay (HOSPITAL_BASED_OUTPATIENT_CLINIC_OR_DEPARTMENT_OTHER): Payer: BC Managed Care – PPO | Admitting: Genetic Counselor

## 2021-10-04 ENCOUNTER — Encounter: Payer: Self-pay | Admitting: Genetic Counselor

## 2021-10-04 ENCOUNTER — Other Ambulatory Visit: Payer: Self-pay

## 2021-10-04 DIAGNOSIS — C50411 Malignant neoplasm of upper-outer quadrant of right female breast: Secondary | ICD-10-CM

## 2021-10-04 DIAGNOSIS — Z171 Estrogen receptor negative status [ER-]: Secondary | ICD-10-CM

## 2021-10-04 DIAGNOSIS — Z170 Estrogen receptor positive status [ER+]: Secondary | ICD-10-CM | POA: Insufficient documentation

## 2021-10-04 DIAGNOSIS — R293 Abnormal posture: Secondary | ICD-10-CM | POA: Diagnosis present

## 2021-10-04 DIAGNOSIS — Z803 Family history of malignant neoplasm of breast: Secondary | ICD-10-CM

## 2021-10-04 HISTORY — DX: Family history of malignant neoplasm of breast: Z80.3

## 2021-10-04 LAB — CMP (CANCER CENTER ONLY)
ALT: 16 U/L (ref 0–44)
AST: 17 U/L (ref 15–41)
Albumin: 4.5 g/dL (ref 3.5–5.0)
Alkaline Phosphatase: 56 U/L (ref 38–126)
Anion gap: 5 (ref 5–15)
BUN: 15 mg/dL (ref 6–20)
CO2: 30 mmol/L (ref 22–32)
Calcium: 9.5 mg/dL (ref 8.9–10.3)
Chloride: 105 mmol/L (ref 98–111)
Creatinine: 0.69 mg/dL (ref 0.44–1.00)
GFR, Estimated: >60 mL/min (ref >60)
Glucose, Bld: 86 mg/dL (ref 70–99)
Potassium: 4.6 mmol/L (ref 3.5–5.1)
Sodium: 140 mmol/L (ref 135–145)
Total Bilirubin: 0.6 mg/dL (ref 0.3–1.2)
Total Protein: 7.4 g/dL (ref 6.5–8.1)

## 2021-10-04 LAB — CBC WITH DIFFERENTIAL (CANCER CENTER ONLY)
Abs Immature Granulocytes: 0.01 10*3/uL (ref 0.00–0.07)
Basophils Absolute: 0 10*3/uL (ref 0.0–0.1)
Basophils Relative: 1 %
Eosinophils Absolute: 0.1 10*3/uL (ref 0.0–0.5)
Eosinophils Relative: 1 %
HCT: 37.4 % (ref 36.0–46.0)
Hemoglobin: 12.7 g/dL (ref 12.0–15.0)
Immature Granulocytes: 0 %
Lymphocytes Relative: 26 %
Lymphs Abs: 1.5 10*3/uL (ref 0.7–4.0)
MCH: 32.3 pg (ref 26.0–34.0)
MCHC: 34 g/dL (ref 30.0–36.0)
MCV: 95.2 fL (ref 80.0–100.0)
Monocytes Absolute: 0.6 10*3/uL (ref 0.1–1.0)
Monocytes Relative: 10 %
Neutro Abs: 3.7 10*3/uL (ref 1.7–7.7)
Neutrophils Relative %: 62 %
Platelet Count: 255 10*3/uL (ref 150–400)
RBC: 3.93 MIL/uL (ref 3.87–5.11)
RDW: 12.6 % (ref 11.5–15.5)
WBC Count: 5.9 10*3/uL (ref 4.0–10.5)
nRBC: 0 % (ref 0.0–0.2)

## 2021-10-04 LAB — GENETIC SCREENING ORDER

## 2021-10-04 NOTE — Progress Notes (Signed)
[place] NOTE  Patient Care Team: Debbrah Alar, NP as PCP - General (Internal Medicine) Rockwell Germany, RN as Oncology Nurse Navigator Mauro Kaufmann, RN as Oncology Nurse Navigator Jovita Kussmaul, MD as Consulting Physician (General Surgery) Benay Pike, MD as Consulting Physician (Hematology and Oncology) Kyung Rudd, MD as Consulting Physician (Radiation Oncology)  CHIEF COMPLAINTS/PURPOSE OF CONSULTATION:  Newly diagnosed breast cancer  HISTORY OF PRESENTING ILLNESS:  Wendy Webb 56 y.o. female is here because of recent diagnosis of right breast IDC.  I reviewed her records extensively and collaborated the history with the patient.  SUMMARY OF ONCOLOGIC HISTORY: Oncology History  Malignant neoplasm of upper-outer quadrant of right breast in female, estrogen receptor negative ([place])  09/25/2021 Imaging   Bilateral screening mammogram with irregular hypoechoic mass in the right breast at 12:00 axis, 5 cm from nipple measuring 2.5 cm corresponding to the palpable area of concern. Ultrasound confirmed irregular hypoechoic mass in the right breast at the 12:00 5 cm from the nipple measuring 2.5 cm corresponding to the palpable area of concern.   09/26/2021 Pathology Results   Right breast needle core biopsy showed invasive ductal carcinoma, high-grade prognostic showed ER 0% negative PR 0% negative Ki-67 of 95% and HER2 negative   09/28/2021 Initial Diagnosis   Malignant neoplasm of upper-outer quadrant of right breast in female, estrogen receptor negative [facility])      MEDICAL HISTORY:  Past Medical History:  Diagnosis Date   Hypertension    IBS (irritable bowel syndrome)    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[place])    09/26/2021 (right breast biopsy)   Mild mitral regurgitation 06/2019   Palpitations     SURGICAL HISTORY: Past Surgical History:  Procedure Laterality Date   GALLBLADDER SURGERY      SOCIAL HISTORY: Social History    Socioeconomic History   Marital status: Married    Spouse name: Not on file   Number of children: 2   Years of education: Not on file   Highest education level: Not on file  Occupational History    Comment: Hair dresser  Tobacco Use   Smoking status: Never   Smokeless tobacco: Never  Vaping Use   Vaping Use: Never used  Substance and Sexual Activity   Alcohol use: Yes    Alcohol/week: 0.0 standard drinks of alcohol    Comment: Glass wine per day   Drug use: No   Sexual activity: Yes    Partners: Male  Other Topics Concern   Not on file  Social History Narrative   Pt is a hair stylist   Married (3rd marriage)   2 sons (both grown)   Enjoys yard work, hiking, Scientist, clinical (histocompatibility and immunogenetics)   Complete 2 year cosmetology   Has one Neurosurgeon   Social Determinants of Radio broadcast assistant Strain: Not on Art therapist Insecurity: Not on file  Transportation Needs: Not on file  Physical Activity: Not on file  Stress: Not on file  Social Connections: Not on file  Intimate Partner Violence: Not on file    FAMILY HISTORY: Family History  Problem Relation Age of Onset   Hypertension Mother    Heart attack Mother    Heart disease Mother        Atrial fibrillation   Obstructive Sleep Apnea Mother    Hypertension Brother    Stroke Maternal Grandmother 75   Colon cancer Neg Hx    Esophageal cancer Neg Hx     ALLERGIES:  is allergic to ace inhibitors and shellfish allergy.  MEDICATIONS:  Current Outpatient Medications  Medication Sig Dispense Refill   Misc Natural Products (TART CHERRY ADVANCED PO) Take 1 tablet by mouth daily.     Multiple Vitamin (MULTIVITAMIN WITH MINERALS) TABS tablet Take 1 tablet by mouth daily.     omeprazole (PRILOSEC) 20 MG capsule Take 1 capsule (20 mg total) by mouth daily. 30 capsule 3   Probiotic Product (PROBIOTIC PO) Take 1 tablet by mouth daily.     No current facility-administered medications for this visit.    REVIEW OF SYSTEMS:   Constitutional:  Denies fevers, chills or abnormal night sweats Eyes: Denies blurriness of vision, double vision or watery eyes Ears, nose, mouth, throat, and face: Denies mucositis or sore throat Respiratory: Denies cough, dyspnea or wheezes Cardiovascular: Denies palpitation, chest discomfort or lower extremity swelling Gastrointestinal:  Denies nausea, heartburn or change in bowel habits Skin: Denies abnormal skin rashes Lymphatics: Denies new lymphadenopathy or easy bruising Neurological:Denies numbness, tingling or new weaknesses Behavioral/Psych: Mood is stable, no new changes  Breast: Denies any palpable lumps or discharge All other systems were reviewed with the patient and are negative.  PHYSICAL EXAMINATION: ECOG PERFORMANCE STATUS: 0 - Asymptomatic  Vitals:   10/04/21 0853  BP: (!) 149/67  Pulse: 63  Resp: 18  Temp: 99.1 F (37.3 C)  SpO2: 100%   Filed Weights   10/04/21 0853  Weight: 148 lb 12.8 oz (67.5 kg)    GENERAL:alert, no distress and comfortable SKIN: skin color, texture, turgor are normal, no rashes or significant lesions EYES: normal, conjunctiva are pink and non-injected, sclera clear OROPHARYNX:no exudate, no erythema and lips, buccal mucosa, and tongue normal  NECK: supple, thyroid normal size, non-tender, without nodularity LYMPH:  no palpable lymphadenopathy in the cervical, axillary or inguinal LUNGS: clear to auscultation and percussion with normal breathing effort HEART: regular rate & rhythm and no murmurs and no lower extremity edema ABDOMEN:abdomen soft, non-tender and normal bowel sounds Musculoskeletal:no cyanosis of digits and no clubbing  PSYCH: alert & oriented x 3 with fluent speech NEURO: no focal motor/sensory deficits BREAST: Palpable breast mass in the right breast occupying most of the upper inner quadrants measuring around 3 cm in largest dimension.  No palpable lymphadenopathy in both breasts.   LABORATORY DATA:  I have reviewed the data as  listed Lab Results  Component Value Date   WBC 5.9 10/04/2021   HGB 12.7 10/04/2021   HCT 37.4 10/04/2021   MCV 95.2 10/04/2021   PLT 255 10/04/2021   Lab Results  Component Value Date   NA 140 10/04/2021   K 4.6 10/04/2021   CL 105 10/04/2021   CO2 30 10/04/2021    RADIOGRAPHIC STUDIES: I have personally reviewed the radiological reports and agreed with the findings in the report.  ASSESSMENT AND PLAN:  Malignant neoplasm of upper-outer quadrant of right breast in female, estrogen receptor negative ([place]) This is a very pleasant 56 year-old female patient with newly diagnosed left breast mass status post imaging and biopsy with pathology showing invasive poorly differentiated adenocarcinoma, grade 3, triple negative, high proliferation index of 95 percent presented in the breast [place] for additional recommendations.  Given triple negative tumor larger than 2 cm at diagnosis, we have discussed about considering neoadjuvant chemotherapy.  Today we have discussed about keynote 522 trial and the role of chemoimmunotherapy in the neoadjuvant setting for triple negative breast cancer.   We have discussed about the regimen, adverse effects  of chemotherapy including but not limited to fatigue, nausea, vomiting, diarrhea, cytopenias, cardiotoxicity, neuropathy, risk of infections, alopecia, immunotherapy related adverse effects.  She understands that some of the side effects from chemoimmunotherapy can be life-threatening, and permanent.   We have discussed excellent complete pathologic response with neoadjuvant chemoimmunotherapy and triple negative breast cancers.  Following neoadjuvant chemotherapy, she will proceed with surgery and adjuvant immunotherapy depending on tolerance.  There is no role for antiestrogen therapy in this setting.  She will need an MRI after completion of neoadjuvant chemotherapy for surgical planning.  She is agreeable to all recommendations.  She will proceed with MRI  before chemotherapy, port placement and will start chemotherapy in the next 1 to 2 weeks.   All questions were answered. The patient knows to call the clinic with any problems, questions or concerns.    Benay Pike, MD 10/04/21

## 2021-10-04 NOTE — Assessment & Plan Note (Signed)
This is a very pleasant 56 year-old female patient with newly diagnosed left breast mass status post imaging and biopsy with pathology showing invasive poorly differentiated adenocarcinoma, grade 3, triple negative, high proliferation index of 95 percent presented in the breast [place] for additional recommendations.  Given triple negative tumor larger than 2 cm at diagnosis, we have discussed about considering neoadjuvant chemotherapy.  Today we have discussed about keynote 522 trial and the role of chemoimmunotherapy in the neoadjuvant setting for triple negative breast cancer.   We have discussed about the regimen, adverse effects of chemotherapy including but not limited to fatigue, nausea, vomiting, diarrhea, cytopenias, cardiotoxicity, neuropathy, risk of infections, alopecia, immunotherapy related adverse effects.  She understands that some of the side effects from chemoimmunotherapy can be life-threatening, and permanent.   We have discussed excellent complete pathologic response with neoadjuvant chemoimmunotherapy and triple negative breast cancers.  Following neoadjuvant chemotherapy, she will proceed with surgery and adjuvant immunotherapy depending on tolerance.  There is no role for antiestrogen therapy in this setting.  She will need an MRI after completion of neoadjuvant chemotherapy for surgical planning.  She is agreeable to all recommendations.  She will proceed with MRI before chemotherapy, port placement and will start chemotherapy in the next 1 to 2 weeks.

## 2021-10-04 NOTE — Progress Notes (Signed)
REFERRING PROVIDER: Benay Pike, MD [facility] [place],  [place] [address]  PRIMARY PROVIDER:  Debbrah Alar, NP  PRIMARY REASON FOR VISIT:  1. Malignant neoplasm of upper-outer quadrant of right breast in female, estrogen receptor negative ([place])   2. Family history of breast cancer    HISTORY OF PRESENT ILLNESS:   Wendy Webb, a 56 y.o. female, was seen for a [place] cancer genetics consultation at the request of Dr. Chryl Heck due to a personal and family history of breast cancer.  Wendy Webb presents to clinic today to discuss the possibility of a hereditary predisposition to cancer, to discuss genetic testing, and to further clarify her future cancer risks, as well as potential cancer risks for family members.   In June 2023, at the age of 44, 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 The treatment plan is pending.   CANCER HISTORY:  Oncology History  Malignant neoplasm of upper-outer quadrant of right breast in female, estrogen receptor negative ([place])  09/25/2021 Imaging   Bilateral screening mammogram with irregular hypoechoic mass in the right breast at 12:00 axis, 5 cm from nipple measuring 2.5 cm corresponding to the palpable area of concern. Ultrasound confirmed irregular hypoechoic mass in the right breast at the 12:00 5 cm from the nipple measuring 2.5 cm corresponding to the palpable area of concern.   09/26/2021 Pathology Results   Right breast needle core biopsy showed invasive ductal carcinoma, high-grade prognostic showed ER 0% negative PR 0% negative Ki-67 of 95% and HER2 negative   09/28/2021 Initial Diagnosis   Malignant neoplasm of upper-outer quadrant of right breast in female, estrogen receptor negative ([place])   10/17/2021 -  Chemotherapy   Patient is on Treatment Plan : BREAST Pembrolizumab (200) D1 + Carboplatin (5) D1 + Paclitaxel (80) D1,8,15 q21d X 4 cycles / Pembrolizumab (200) D1 + AC D1 q21d x 4 cycles         RISK FACTORS:  Colonoscopy: no;  has had cologuard . Up to date with pelvic exams: most recent PAP in 2022. Menarche was at age 71.  First live birth at age 7.  Menopausal status: postmenopausal.  OCP use for approximately 10 years.  HRT use: 0 years.  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[place])    09/26/2021 (right breast biopsy)   Mild mitral regurgitation 06/2019   Palpitations     Past Surgical History:  Procedure Laterality Date   GALLBLADDER SURGERY      FAMILY HISTORY:  We obtained a detailed, 4-generation family history.  Significant diagnoses are listed below: Family History  Problem Relation Age of Onset   Breast cancer Other 75       PGM's sister    Ms. Dayley is unaware of previous family history of genetic testing for hereditary cancer risks. There is no reported Ashkenazi Jewish ancestry. There is no known consanguinity.  GENETIC COUNSELING ASSESSMENT: Wendy Webb is a 56 y.o. female with a personal history of breast cancer which is somewhat suggestive of a hereditary cancer syndrome and predisposition to cancer given the triple negative status of her breast cancer. We, therefore, discussed and recommended the following at today's visit.   DISCUSSION: We discussed that 5 - 10% of cancer is hereditary.  Most cases of hereditary breast cancer are associated with mutations  in BRCA1/2.  There are other genes that can be associated with hereditary  cancer syndromes.  We discussed that testing is beneficial for several reasons including knowing how to follow individuals for their cancer risks, identifying whether potential treatment options, such as PARP inhibitors, would be beneficial, and understanding if other family members could be at risk for cancer and allowing them to undergo genetic testing.   We reviewed the characteristics,  features and inheritance patterns of hereditary cancer syndromes. We also discussed genetic testing, including the appropriate family members to test, the process of testing, insurance coverage and turn-around-time for results. We discussed the implications of a negative, positive, carrier and/or variant of uncertain significant result. We recommended [name] pursue genetic testing for a panel that includes genes associated with breast and other cancers.   The CustomNext-Cancer+RNAinsight panel offered by [facility] includes sequencing and rearrangement analysis for the following 47 genes:  APC, ATM, AXIN2, BARD1, BMPR1A, BRCA1, BRCA2, BRIP1, CDH1, CDK4, CDKN2A, CHEK2, DICER1, EPCAM, GREM1, HOXB13, MEN1, MLH1, MSH2, MSH3, MSH6, MUTYH, NBN, NF1, NF2, NTHL1, PALB2, PMS2, POLD1, POLE, PTEN, RAD51C, RAD51D, RECQL, RET, SDHA, SDHAF2, SDHB, SDHC, SDHD, SMAD4, SMARCA4, STK11, TP53, TSC1, TSC2, and VHL.  RNA data is routinely analyzed for use in variant interpretation for all genes.  Based on [name]'s personal history of triple negative breast cancer, she meets medical criteria for genetic testing. Despite that she meets criteria, she may still have an out of pocket cost. We discussed that if her out of pocket cost for testing is over $100, the laboratory should contact her and discuss the self-pay prices and/or patient pay assistance programs.    PLAN: After considering the risks, benefits, and limitations, Ms. Bhavsar provided informed consent to pursue genetic testing and the blood sample was sent to [facility] for analysis of the CustomNext-Cancer +RNAinsight Panel. Results should be available within approximately 3 weeks' time, at which point they will be disclosed by telephone to [name], as will any additional recommendations warranted by these results. [name] will receive a summary of her genetic counseling visit and a copy of her results once available. This information will also be  available in Epic.   [name] questions were answered to her satisfaction today. Our contact information was provided should additional questions or concerns arise. Thank you for the referral and allowing [place] to share in the care of your patient.   Bassy Fetterly M. Joette Catching, [place], [facility] Genetic Counselor [email or web address] (P) [phone]  The patient was seen for a total of 15 minutes in face-to-face genetic counseling.  Patient was accompanied by her husband, Octavia Bruckner.  Drs. Lindi Adie and/or Burr Medico were available to discuss this case as needed.    _______________________________________________________________________ For Office Staff:  Number of people involved in session: 2 Was an Intern/ student involved with case: no

## 2021-10-04 NOTE — Progress Notes (Signed)
START ON PATHWAY REGIMEN - Breast ? ? ?  Cycles 1 through 4: A cycle is every 21 days: ?    Pembrolizumab  ?    Paclitaxel  ?    Carboplatin  ?    Filgrastim-xxxx  ?  Cycles 5 through 8: A cycle is every 21 days: ?    Pembrolizumab  ?    Doxorubicin  ?    Cyclophosphamide  ?    Pegfilgrastim-xxxx  ? ?**Always confirm dose/schedule in your pharmacy ordering system** ? ?Patient Characteristics: ?Preoperative or Nonsurgical Candidate (Clinical Staging), Neoadjuvant Therapy followed by Surgery, Invasive Disease, Chemotherapy, HER2 Negative/Unknown/Equivocal, ER Negative/Unknown, Platinum Therapy Indicated and Candidate for Checkpoint Inhibitor ?Therapeutic Status: Preoperative or Nonsurgical Candidate (Clinical Staging) ?AJCC M Category: cM0 ?AJCC Grade: G3 ?Breast Surgical Plan: Neoadjuvant Therapy followed by Surgery ?ER Status: Negative (-) ?AJCC 8 Stage Grouping: IIB ?HER2 Status: Negative (-) ?AJCC T Category: cT2 ?AJCC N Category: cN0 ?PR Status: Negative (-) ?Intent of Therapy: ?Curative Intent, Discussed with Patient ?

## 2021-10-04 NOTE — Therapy (Signed)
OUTPATIENT PHYSICAL THERAPY BREAST CANCER BASELINE EVALUATION   Patient Name: Wendy Webb MRN: [id number] DOB:1966/01/26, 56 y.o., female Today's Date: 10/04/2021   PT End of Session - 10/04/21 0910     Visit Number 1    Number of Visits 2    Date for PT Re-Evaluation 04/05/22    PT Start Time 0956    PT Stop Time 4098   Also saw pt from 1100 to 1125 for a total of 40 minutes   PT Time Calculation (min) 15 min    Activity Tolerance Patient tolerated treatment well    Behavior During Therapy [facility] for tasks assessed/performed             Past Medical History:  Diagnosis Date   Hypertension    IBS (irritable bowel syndrome)    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[place])    09/26/2021 (right breast biopsy)   Mild mitral regurgitation 06/2019   Palpitations    Past Surgical History:  Procedure Laterality Date   GALLBLADDER SURGERY     Patient Active Problem List   Diagnosis Date Noted   Malignant neoplasm of upper-outer quadrant of right breast in female, estrogen receptor negative ([place]) 09/28/2021   IBS (irritable bowel syndrome)    Hypertension    Mild mitral regurgitation 06/2019   Palpitations 07/26/2014   Mitral regurgitation 07/26/2014     REFERRING PROVIDER: Dr. Autumn Messing  REFERRING DIAG: Right breast cancer  THERAPY DIAG:  Malignant neoplasm of upper-outer quadrant of right breast in female, estrogen receptor positive ([place])  Abnormal posture  Rationale for Evaluation and Treatment Rehabilitation  ONSET DATE: 09/25/2021  SUBJECTIVE                                                                                                                                                                                           SUBJECTIVE STATEMENT: Patient reports she is here today to be seen by her medical team for her newly diagnosed right breast cancer.   PERTINENT HISTORY:  Patient was diagnosed on 09/25/2021 with right grade 3 invasive ductal carcinoma breast  cancer. It measures 2.5 cm and is located in the upper outer quadrant. It is triple negative with a Ki67 of 95%.   PATIENT GOALS   reduce lymphedema risk and learn post op HEP.   PAIN:  Are you having pain? No   PRECAUTIONS: Active CA   HAND DOMINANCE: left  WEIGHT BEARING RESTRICTIONS No  FALLS:  Has patient fallen in last 6 months? No  LIVING ENVIRONMENT: Patient lives with: her husband, 59 y.o. son and his friend who is also 24 Lives in: House/apartment  Has following equipment at home: None  OCCUPATION: cosmetologist  LEISURE: She walks or runs 45 min/day  PRIOR LEVEL OF FUNCTION: Independent   OBJECTIVE  COGNITION:  Overall cognitive status: Within functional limits for tasks assessed    POSTURE:  Forward head and rounded shoulders posture  UPPER EXTREMITY AROM/PROM:  A/PROM RIGHT   eval   Shoulder extension 42  Shoulder flexion 154  Shoulder abduction 163  Shoulder internal rotation 64  Shoulder external rotation 80    (Blank rows = not tested)  A/PROM LEFT   eval  Shoulder extension 47  Shoulder flexion 142  Shoulder abduction 161  Shoulder internal rotation 60  Shoulder external rotation 90    (Blank rows = not tested)   CERVICAL AROM: All within normal limits  UPPER EXTREMITY STRENGTH: WFL   LYMPHEDEMA ASSESSMENTS:   LANDMARK RIGHT   eval  10 cm proximal to olecranon process 25.5  Olecranon process 23  10 cm proximal to ulnar styloid process 19.8  Just proximal to ulnar styloid process 14  Across hand at thumb web space 17.5  At base of 2nd digit 5.9  (Blank rows = not tested)  LANDMARK LEFT   eval  10 cm proximal to olecranon process 26.7  Olecranon process 23.5  10 cm proximal to ulnar styloid process 19.3  Just proximal to ulnar styloid process 13.3  Across hand at thumb web space 17.5  At base of 2nd digit 5.8  (Blank rows = not tested)   L-DEX LYMPHEDEMA SCREENING:  The patient was assessed using the L-Dex machine  today to produce a lymphedema index baseline score. The patient will be reassessed on a regular basis (typically every 3 months) to obtain new L-Dex scores. If the score is > 6.5 points away from his/her baseline score indicating onset of subclinical lymphedema, it will be recommended to wear a compression garment for 4 weeks, 12 hours per day and then be reassessed. If the score continues to be > 6.5 points from baseline at reassessment, we will initiate lymphedema treatment. Assessing in this manner has a 95% rate of preventing clinically significant lymphedema.   L-DEX FLOWSHEETS - 10/04/21 0900       L-DEX LYMPHEDEMA SCREENING   Measurement Type Unilateral    L-DEX MEASUREMENT EXTREMITY Upper Extremity    POSITION  Standing    DOMINANT SIDE Left    At Risk Side Right    BASELINE SCORE (UNILATERAL) 5.6              QUICK DASH SURVEY:  Wendy Webb - 10/04/21 0001     Open a tight or new jar No difficulty    Do heavy household chores (wash walls, wash floors) No difficulty    Carry a shopping bag or briefcase No difficulty    Wash your back No difficulty    Use a knife to cut food No difficulty    Recreational activities in which you take some force or impact through your arm, shoulder, or hand (golf, hammering, tennis) No difficulty    During the past week, to what extent has your arm, shoulder or hand problem interfered with your normal social activities with family, friends, neighbors, or groups? Slightly    During the past week, to what extent has your arm, shoulder or hand problem limited your work or other regular daily activities Not at all    Arm, shoulder, or hand pain. None    Tingling (pins and needles) in your arm, shoulder, or  hand Mild    Difficulty Sleeping No difficulty    DASH Score 4.55 %              PATIENT EDUCATION:  Education details: Lymphedema risk reduction and post op shoulder/posture HEP Person educated: Patient Education method: Explanation,  Demonstration, Handout Education comprehension: Patient verbalized understanding and returned demonstration   HOME EXERCISE PROGRAM: Patient was instructed today in a home exercise program today for post op shoulder range of motion. These included active assist shoulder flexion in sitting, scapular retraction, wall walking with shoulder abduction, and hands behind head external rotation.  She was encouraged to do these twice a day, holding 3 seconds and repeating 5 times when permitted by her physician.   ASSESSMENT:  CLINICAL IMPRESSION: Patient was diagnosed on 09/25/2021 with right grade 3 invasive ductal carcinoma breast cancer. It measures 2.5 cm and is located in the upper outer quadrant. It is triple negative with a Ki67 of 95%. Her multidisciplinary medical team met prior to her assessments to determine a recommended treatment plan. She is planning to have neoadjuvant chemotherapy followed by a right lumpectomy and sentinel node biopsy and radiation. She will benefit from a post op PT reassessment to determine needs and from L-Dex screens every 3 months for 2 years to detect subclinical lymphedema.  Pt will benefit from skilled therapeutic intervention to improve on the following deficits: Decreased knowledge of precautions, impaired UE functional use, pain, decreased ROM, postural dysfunction.   PT treatment/interventions: ADL/self-care home management, pt/family education, therapeutic exercise  REHAB POTENTIAL: Excellent  CLINICAL DECISION MAKING: Stable/uncomplicated  EVALUATION COMPLEXITY: Low   GOALS: Goals reviewed with patient? YES  LONG TERM GOALS: (STG=LTG)    Name Target Date Goal status  1 Pt will be able to verbalize understanding of pertinent lymphedema risk reduction practices relevant to her dx specifically related to skin care.  Baseline:  No knowledge 10/04/2021 Achieved at eval  2 Pt will be able to return demo and/or verbalize understanding of the post op HEP  related to regaining shoulder ROM. Baseline:  No knowledge 10/04/2021 Achieved at eval  3 Pt will be able to verbalize understanding of the importance of attending the post op After Breast CA Class for further lymphedema risk reduction education and therapeutic exercise.  Baseline:  No knowledge 10/04/2021 Achieved at eval  4 Pt will demo she has regained full shoulder ROM and function post operatively compared to baselines.  Baseline: See objective measurements taken today. 04/05/2022      PLAN: PT FREQUENCY/DURATION: EVAL and 1 follow up appointment.   PLAN FOR NEXT SESSION: will reassess 3-4 weeks post op to determine needs.   Patient will follow up at outpatient cancer rehab 3-4 weeks following surgery.  If the patient requires physical therapy at that time, a specific plan will be dictated and sent to the referring physician for approval. The patient was educated today on appropriate basic range of motion exercises to begin post operatively and the importance of attending the After Breast Cancer class following surgery.  Patient was educated today on lymphedema risk reduction practices as it pertains to recommendations that will benefit the patient immediately following surgery.  She verbalized good understanding.    Physical Therapy Information for After Breast Cancer Surgery/Treatment:  Lymphedema is a swelling condition that you may be at risk for in your arm if you have lymph nodes removed from the armpit area.  After a sentinel node biopsy, the risk is approximately 5-9% and is higher after  an axillary node dissection.  There is treatment available for this condition and it is not life-threatening.  Contact your physician or physical therapist with concerns. You may begin the 4 shoulder/posture exercises (see additional sheet) when permitted by your physician (typically a week after surgery).  If you have drains, you may need to wait until those are removed before beginning range of  motion exercises.  A general recommendation is to not lift your arms above shoulder height until drains are removed.  These exercises should be done to your tolerance and gently.  This is not a "no pain/no gain" type of recovery so listen to your body and stretch into the range of motion that you can tolerate, stopping if you have pain.  If you are having immediate reconstruction, ask your plastic surgeon about doing exercises as he or she may want you to wait. We encourage you to attend the free one time ABC (After Breast Cancer) class offered by [facility].  You will learn information related to lymphedema risk, prevention and treatment and additional exercises to regain mobility following surgery.  You can call [phone] for more information.  This is offered the 1st and 3rd Monday of each month.  You only attend the class one time. While undergoing any medical procedure or treatment, try to avoid blood pressure being taken or needle sticks from occurring on the arm on the side of cancer.   This recommendation begins after surgery and continues for the rest of your life.  This may help reduce your risk of getting lymphedema (swelling in your arm). An excellent resource for those seeking information on lymphedema is the National Lymphedema Network's web site. It can be accessed at [place].org If you notice swelling in your hand, arm or breast at any time following surgery (even if it is many years from now), please contact your doctor or physical therapist to discuss this.  Lymphedema can be treated at any time but it is easier for you if it is treated early on.  If you feel like your shoulder motion is not returning to normal in a reasonable amount of time, please contact your surgeon or physical therapist.  [place] [phone]. [address], [place] [address]  ABC CLASS After Breast Cancer Class  After Breast Cancer  Class is a specially designed exercise class to assist you in a safe recover after having breast cancer surgery.  In this class you will learn how to get back to full function whether your drains were just removed or if you had surgery a month ago.  This one-time class is held the 1st and 3rd Monday of every month from 11:00 a.m. until 12:00 noon virtually.  This class is FREE and space is limited. For more information or to register for the next available class, call [phone].  Class Goals  Understand specific stretches to improve the flexibility of you chest and shoulder. Learn ways to safely strengthen your upper body and improve your posture. Understand the warning signs of infection and why you may be at risk for an arm infection. Learn about Lymphedema and prevention.  ** You do not attend this class until after surgery.  Drains must be removed to participate  Patient was instructed today in a home exercise program today for post op shoulder range of motion. These included active assist shoulder flexion in sitting, scapular retraction, wall walking with shoulder abduction, and hands behind head  external rotation.  She was encouraged to do these twice a day, holding 3 seconds and repeating 5 times when permitted by her physician.  Annia Friendly, [place] 10/04/21 11:32 AM

## 2021-10-04 NOTE — Research (Signed)
Trial:  [employer] 2021-05 - Specimen Collection Study to Evaluate Biomarkers in Subjects with Cancer  Patient Wendy Webb was identified by Dr Chryl Heck as a potential candidate for the above listed study.  This Clinical Research Nurse met with Wendy Webb, [id number], on 10/04/21 in a manner and location that ensures patient privacy to discuss participation in the above listed research study.  Patient is Accompanied by her husband .  A copy of the informed consent document and embedded HIPAA was provided to the patient.  Patient reads, speaks, and understands [place].   Patient was provided with the business card of this Nurse and encouraged to contact the research team with any questions.  Approximately 15 minutes were spent with the patient reviewing the informed consent documents.  Patient was provided the option of taking informed consent documents home to review and was encouraged to review at their convenience with their support network, including other care providers. Patient took the consent documents home to review. Wendy Webb Wendy Lindo, RN, BSN, [facility] She  Her  Wendy Webb Clinical Research Nurse [place] [phone]  Pager [phone] 10/04/2021 1:18 PM

## 2021-10-04 NOTE — Research (Signed)
Trial:  [id number] - TREATMENT OF REFRACTORY NAUSEA Patient Wendy Webb was identified by Dr Chryl Heck as a potential candidate for the above listed study.  This Clinical Research Nurse met with Wendy Webb, [id number], on 10/04/21 in a manner and location that ensures patient privacy to discuss participation in the above listed research study.  Patient is Accompanied by her husband .  A copy of the informed consent document and separate HIPAA Authorization was provided to the patient.  Patient reads, speaks, and understands [place].   Patient was provided with the business card of this Nurse and encouraged to contact the research team with any questions.  Approximately 15 minutes were spent with the patient reviewing the informed consent documents.  Patient was provided the option of taking informed consent documents home to review and was encouraged to review at their convenience with their support network, including other care providers. Patient took the consent documents home to review. Wendy Webb Wendy Mcshan, RN, BSN, [facility] She  Her  [place] Clinical Research Nurse [facility] Direct Dial [phone]  Pager [phone] 10/04/2021 1:19 PM

## 2021-10-05 ENCOUNTER — Telehealth: Payer: Self-pay | Admitting: *Deleted

## 2021-10-05 ENCOUNTER — Other Ambulatory Visit: Payer: Self-pay | Admitting: *Deleted

## 2021-10-05 ENCOUNTER — Encounter: Payer: Self-pay | Admitting: Hematology and Oncology

## 2021-10-05 DIAGNOSIS — C50411 Malignant neoplasm of upper-outer quadrant of right female breast: Secondary | ICD-10-CM

## 2021-10-05 DIAGNOSIS — Z171 Estrogen receptor negative status [ER-]: Secondary | ICD-10-CM

## 2021-10-05 NOTE — Telephone Encounter (Signed)
Received call from patient stating she meant to tell the providers yesterday that she had been having headaches for the past 2 weeks with some ringing in her ears and slight dizziness. Per Dr.Iruku we will order MRI brain to rule out andything.  Patient verbalized understanding.

## 2021-10-08 ENCOUNTER — Other Ambulatory Visit: Payer: Self-pay

## 2021-10-08 ENCOUNTER — Emergency Department (HOSPITAL_COMMUNITY)
Admission: EM | Admit: 2021-10-08 | Discharge: 2021-10-08 | Disposition: A | Discharge status: Home / Self Care | Payer: BC Managed Care – PPO | Attending: Emergency Medicine | Admitting: Emergency Medicine

## 2021-10-08 ENCOUNTER — Encounter (HOSPITAL_COMMUNITY): Payer: Self-pay

## 2021-10-08 DIAGNOSIS — R519 Headache, unspecified: Secondary | ICD-10-CM | POA: Diagnosis present

## 2021-10-08 DIAGNOSIS — Z853 Personal history of malignant neoplasm of breast: Secondary | ICD-10-CM | POA: Insufficient documentation

## 2021-10-08 DIAGNOSIS — R42 Dizziness and giddiness: Secondary | ICD-10-CM | POA: Diagnosis not present

## 2021-10-08 NOTE — ED Provider Notes (Signed)
[facility] DEPT Provider Note   CSN: [id number] Arrival date & time: 10/08/21  2778     History Chief Complaint  Patient presents with   Headache   Dizziness    Wendy Webb is a 56 y.o. 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 She reports some tinnitus with the headache, but denies any blurry vision or room spinning sensation. She reports some lightheadedness, but this is not a new problem for her. She was recently diagnosed with triple negative breast cancer on 09/26/21 and she reports that is when the headache started too. She recently had labs that were normal. Her oncologist is is ordering an MRI per the patient's request and she is getting it done this week. She reports that her headache is relieved with Tylenol. She denies any weakness, trouble walking, trouble talking, chest pain, SOB, fever, or neck pain.  The patient reports that she came to the emergency department to get her MRI as she did not want to wait any longer till Thursday to see if there is something wrong.   Headache Associated symptoms: no abdominal pain, no cough, no dizziness, no fever, no nausea, no neck pain, no numbness, no photophobia, no vomiting and no weakness   Dizziness Associated symptoms: headaches   Associated symptoms: no chest pain, no nausea, no vomiting and no weakness        Home Medications Prior to Admission medications   Medication Sig Start Date End Date Taking? Authorizing Provider  Cyanocobalamin ([employer] VITAMIN B12 PO) Take by mouth.    [provider]  Misc Natural Products (TART CHERRY ADVANCED PO) Take 1 tablet by mouth daily.    [provider]  Multiple Vitamin (MULTIVITAMIN WITH MINERALS) TABS tablet Take 1 tablet by mouth daily.    [provider]  omeprazole (PRILOSEC) 20 MG capsule Take 1 capsule  (20 mg total) by mouth daily. Patient not taking: Reported on 10/04/2021 10/26/19   Debbrah Alar, NP  Probiotic Product (PROBIOTIC PO) Take 1 tablet by mouth daily.    [provider]      Allergies    Ace inhibitors and Shellfish allergy    Review of Systems   Review of Systems  Constitutional:  Negative for chills and fever.  Eyes:  Negative for photophobia and visual disturbance.  Respiratory:  Negative for cough.   Cardiovascular:  Negative for chest pain.  Gastrointestinal:  Negative for abdominal pain, nausea and vomiting.  Musculoskeletal:  Negative for neck pain.  Neurological:  Positive for light-headedness and headaches. Negative for dizziness, syncope, facial asymmetry, weakness and numbness.    Physical Exam Updated Vital Signs BP (!) 145/91 (BP Location: Left Arm)   Pulse 66   Temp 98.1 F (36.7 C) (Oral)   Resp 18   Ht '5\' 7"'$  (1.702 m)   Wt 67.5 kg   LMP 05/14/2019   BMI 23.31 kg/m  Physical Exam Vitals reviewed.  Constitutional:      General: She is not in acute distress.    Appearance: Normal appearance. She is not ill-appearing or toxic-appearing.     Comments: Mildly anxious appearing  HENT:     Head: Normocephalic and atraumatic.  Eyes:     General: No scleral icterus.    Extraocular Movements: Extraocular movements intact.     Pupils: Pupils are equal, round, and reactive to light.  Cardiovascular:  Rate and Rhythm: Normal rate and regular rhythm.  Pulmonary:     Effort: Pulmonary effort is normal.     Breath sounds: Normal breath sounds.  Abdominal:     General: Abdomen is flat. Bowel sounds are normal.     Palpations: Abdomen is soft.  Musculoskeletal:        General: No deformity.     Cervical back: Normal range of motion.  Skin:    General: Skin is warm and dry.  Neurological:     General: No focal deficit present.     Mental Status: She is alert. Mental status is at baseline.     GCS: GCS eye subscore is 4. GCS verbal  subscore is 5. GCS motor subscore is 6.     Cranial Nerves: Cranial nerves 2-12 are intact. No cranial nerve deficit, dysarthria or facial asymmetry.     Sensory: No sensory deficit.     Motor: No weakness or pronator drift.     Coordination: Finger-Nose-Finger Test normal.     Comments: GCS 15.  Cranial nerves II through XII intact.  No facial asymmetry.  No dysarthria.  Patient is answering questions appropriate with appropriate speech.  Sensation intact throughout.  Strength is 5 out of 5 in patient's upper and lower extremities.  No pronator drift.  Normal finger-nose.     ED Results / Procedures / Treatments   Labs (all labs ordered are listed, but only abnormal results are displayed) Labs Reviewed - No data to display  EKG None  Radiology No results found.  Procedures Procedures   Medications Ordered in ED Medications - No data to display  ED Course/ Medical Decision Making/ A&P                           Medical Decision Making   56 year old female presents the emergency department for evaluation of intermittent right sided headache/pressure for the past 2 weeks.  Differential diagnosis includes but is not limited to migraine, headache, meningitis, space-occupying lesion, anxiety, sinusitis.  Vital signs show slightly elevated blood pressure otherwise normal.  Physical exam shows a mildly anxious appearing female otherwise regular rate and rhythm.  Clear to auscultation bilaterally.  She has a benign neurological exam.  I discussed with the patient that I do not see any emergent need for an MRI given that she has a benign neurological exam, normal vitals, and is overall well-appearing.  I had a shared decision making with the patient about ordering CT imaging now versus waiting for her MRI testing in a few days.  I discussed that CT imaging is a lot of radiation and it may or may not show why she is having these headaches.  I discussed with her that it may show cancer, but  however it may not be as well as an MRI scan is.  I discussed with her that I could provide her some anxiety relief with getting a CT scan to see if that shows anything.  The patient reports that she feels more comfortable after discussing this with her and she would like to wait a few days until her MRI.  She declines any CT imaging.  Patient recently had labs in the past few days.  I reviewed CBC and CMP and they were unremarkable.  I do not see the need for repeat labs the patient's been having the symptoms that have been intermittent, however unchanged since.  I discussed with her that I  can provide a migraine cocktail with IV medications and fluids.  Patient declined these as well.  Given that these headaches started the day she was diagnosed, are relieved with tylenol, and she has a benign neurological exam as well as reassuring vitals, I doubt any emergent space occupying lesion. Doubt any stroke.   I discussed her to follow-up with her primary care doctor or cancer doctor for the anxiety she is having about her new diagnosis with cancer.  I discussed with her if she changes her mind at any time that she can return to the emergency department and we can do CT imaging, labs, and IV medications.  We did discuss return precautions and follow-up of symptoms.  Patient verbalized understanding and agrees to the plan.  Patient is stable being discharged home in good condition.  I discussed this case with my attending physician who cosigned this note including patient's presenting symptoms, physical exam, and planned diagnostics and interventions. Attending physician stated agreement with plan or made changes to plan which were implemented.   Final Clinical Impression(s) / ED Diagnoses Final diagnoses:  Bad headache    Rx / DC Orders ED Discharge Orders     None         Sherrell Puller, Hershal Coria 10/08/21 2128    Tegeler, Gwenyth Allegra, MD 10/12/21 [phone]

## 2021-10-08 NOTE — Discharge Instructions (Addendum)
You were seen in the ER for evaluation of your headache. You have chosen to not do any labs, imaging, or medications. Please follow up with your scheduled MRI on Thursday. Please come back at any time to receive these treatments. If you have any dizziness, weakness, chest pain, shortness of breath, vision changes, trouble walking, trouble talking, worsening headache, please return to the ER immediately. If you have any concern, new or worsening symptoms, please return to the ER.   Contact a health care provider if: Medicine does not help your symptoms. You have a headache that is different from your usual headache. You have nausea or you vomit. You have a fever. Get help right away if: Your headache: Becomes severe quickly. Gets worse after moderate to intense physical activity. You have any of these symptoms: Repeated vomiting. Pain or stiffness in your neck. Changes to your vision. Pain in an eye or ear. Problems with speech. Muscular weakness or loss of muscle control. Loss of balance or coordination. You feel faint or pass out. You have confusion. You have a seizure. These symptoms may represent a serious problem that is an emergency. Do not wait to see if the symptoms will go away. Get medical help right away. Call your local emergency services (911 in the [place] Do not drive yourself to the hospital.

## 2021-10-08 NOTE — ED Triage Notes (Signed)
Patient c/o headache/pressure on the right side since 09/26/21. Patient c/o slight dizziness. Patient denies blurred vision. Patient was diagnosed with Breast cancer on 09/26/21.

## 2021-10-11 ENCOUNTER — Other Ambulatory Visit: Payer: BC Managed Care – PPO

## 2021-10-11 ENCOUNTER — Other Ambulatory Visit (HOSPITAL_COMMUNITY): Payer: BC Managed Care – PPO

## 2021-10-12 ENCOUNTER — Other Ambulatory Visit: Payer: Self-pay | Admitting: Hematology and Oncology

## 2021-10-12 ENCOUNTER — Encounter: Payer: Self-pay | Admitting: *Deleted

## 2021-10-12 ENCOUNTER — Telehealth: Payer: Self-pay | Admitting: *Deleted

## 2021-10-12 ENCOUNTER — Other Ambulatory Visit (HOSPITAL_COMMUNITY): Payer: BC Managed Care – PPO

## 2021-10-12 ENCOUNTER — Ambulatory Visit (HOSPITAL_COMMUNITY)
Admission: RE | Admit: 2021-10-12 | Discharge: 2021-10-12 | Disposition: A | Discharge status: Home / Self Care | Payer: BC Managed Care – PPO | Source: Ambulatory Visit | Attending: Hematology and Oncology | Admitting: Hematology and Oncology

## 2021-10-12 DIAGNOSIS — C50411 Malignant neoplasm of upper-outer quadrant of right female breast: Secondary | ICD-10-CM

## 2021-10-12 DIAGNOSIS — Z171 Estrogen receptor negative status [ER-]: Secondary | ICD-10-CM | POA: Diagnosis present

## 2021-10-12 MED ORDER — DEXAMETHASONE 4 MG PO TABS: ORAL_TABLET | ORAL | 1 refills | Status: DC

## 2021-10-12 MED ORDER — LIDOCAINE-PRILOCAINE 2.5-2.5 % EX CREA: TOPICAL_CREAM | CUTANEOUS | 3 refills | Status: DC

## 2021-10-12 MED ORDER — PROCHLORPERAZINE MALEATE 10 MG PO TABS: 10.0000 mg | ORAL_TABLET | Freq: Four times a day (QID) | ORAL | 1 refills | Status: DC | PRN

## 2021-10-12 MED ORDER — ONDANSETRON HCL 8 MG PO TABS: 8.0000 mg | ORAL_TABLET | Freq: Two times a day (BID) | ORAL | 1 refills | Status: DC | PRN

## 2021-10-12 MED ORDER — GADOBUTROL 1 MMOL/ML IV SOLN
6.0000 mL | Freq: Once | INTRAVENOUS | Status: AC | PRN
  Administered 2021-10-12: 6 mL via INTRAVENOUS

## 2021-10-12 NOTE — Telephone Encounter (Signed)
Spoke with patient to follow up from [facility] 6/28 and assess navigation needs. Patient denies any questions or concerns at this time. Encouraged her to call should anything arise. Patient verbalized understanding.

## 2021-10-13 ENCOUNTER — Other Ambulatory Visit: Payer: BC Managed Care – PPO

## 2021-10-13 ENCOUNTER — Other Ambulatory Visit: Payer: Self-pay | Admitting: Hematology and Oncology

## 2021-10-13 ENCOUNTER — Inpatient Hospital Stay: Payer: BC Managed Care – PPO | Attending: Hematology and Oncology

## 2021-10-13 ENCOUNTER — Encounter: Payer: Self-pay | Admitting: *Deleted

## 2021-10-13 ENCOUNTER — Telehealth: Payer: Self-pay | Admitting: Genetic Counselor

## 2021-10-13 ENCOUNTER — Other Ambulatory Visit (HOSPITAL_COMMUNITY): Payer: BC Managed Care – PPO

## 2021-10-13 ENCOUNTER — Other Ambulatory Visit: Payer: Self-pay

## 2021-10-13 ENCOUNTER — Ambulatory Visit (HOSPITAL_COMMUNITY)
Admission: RE | Admit: 2021-10-13 | Discharge: 2021-10-13 | Disposition: A | Discharge status: Home / Self Care | Payer: BC Managed Care – PPO | Source: Ambulatory Visit | Attending: Hematology and Oncology | Admitting: Hematology and Oncology

## 2021-10-13 DIAGNOSIS — I10 Essential (primary) hypertension: Secondary | ICD-10-CM | POA: Insufficient documentation

## 2021-10-13 DIAGNOSIS — Z803 Family history of malignant neoplasm of breast: Secondary | ICD-10-CM | POA: Insufficient documentation

## 2021-10-13 DIAGNOSIS — Z0189 Encounter for other specified special examinations: Secondary | ICD-10-CM | POA: Diagnosis not present

## 2021-10-13 DIAGNOSIS — Z171 Estrogen receptor negative status [ER-]: Secondary | ICD-10-CM | POA: Diagnosis not present

## 2021-10-13 DIAGNOSIS — C50411 Malignant neoplasm of upper-outer quadrant of right female breast: Secondary | ICD-10-CM | POA: Insufficient documentation

## 2021-10-13 DIAGNOSIS — T451X5A Adverse effect of antineoplastic and immunosuppressive drugs, initial encounter: Secondary | ICD-10-CM | POA: Insufficient documentation

## 2021-10-13 DIAGNOSIS — R000 Tachycardia, unspecified: Secondary | ICD-10-CM | POA: Insufficient documentation

## 2021-10-13 DIAGNOSIS — Z800 Family history of malignant neoplasm of digestive organs: Secondary | ICD-10-CM | POA: Insufficient documentation

## 2021-10-13 DIAGNOSIS — Z5111 Encounter for antineoplastic chemotherapy: Secondary | ICD-10-CM | POA: Insufficient documentation

## 2021-10-13 DIAGNOSIS — D702 Other drug-induced agranulocytosis: Secondary | ICD-10-CM | POA: Insufficient documentation

## 2021-10-13 DIAGNOSIS — Z5112 Encounter for antineoplastic immunotherapy: Secondary | ICD-10-CM | POA: Insufficient documentation

## 2021-10-13 DIAGNOSIS — K589 Irritable bowel syndrome without diarrhea: Secondary | ICD-10-CM | POA: Insufficient documentation

## 2021-10-13 DIAGNOSIS — Z79899 Other long term (current) drug therapy: Secondary | ICD-10-CM | POA: Insufficient documentation

## 2021-10-13 DIAGNOSIS — Z7952 Long term (current) use of systemic steroids: Secondary | ICD-10-CM | POA: Insufficient documentation

## 2021-10-13 DIAGNOSIS — Z5189 Encounter for other specified aftercare: Secondary | ICD-10-CM | POA: Insufficient documentation

## 2021-10-13 DIAGNOSIS — I340 Nonrheumatic mitral (valve) insufficiency: Secondary | ICD-10-CM | POA: Insufficient documentation

## 2021-10-13 LAB — ECHOCARDIOGRAM COMPLETE
Area-P 1/2: 4.18 cm2
Calc EF: 67.3 %
MV VTI: 2.34 cm2
S' Lateral: 2.9 cm
Single Plane A2C EF: 68.3 %
Single Plane A4C EF: 67.2 %

## 2021-10-13 NOTE — Progress Notes (Signed)
Message sent to our CPP to switch the order of chemo to adriamycin based regimen first given the drug shortage.  Veronica Fretz

## 2021-10-13 NOTE — Progress Notes (Signed)
Pharmacist Chemotherapy Monitoring - Initial Assessment    Anticipated start date: 10/20/21   The following has been reviewed per standard work regarding the patient's treatment regimen: The patient's diagnosis, treatment plan and drug doses, and organ/hematologic function Lab orders and baseline tests specific to treatment regimen  The treatment plan start date, drug sequencing, and pre-medications Prior authorization status  Patient's documented medication list, including drug-drug interaction screen and prescriptions for anti-emetics and supportive care specific to the treatment regimen The drug concentrations, fluid compatibility, administration routes, and timing of the medications to be used The patient's access for treatment and lifetime cumulative dose history, if applicable  The patient's medication allergies and previous infusion related reactions, if applicable   Changes made to treatment plan:  treatment plan date  Follow up needed:  N/A   Kennith Center, Pharm.D., CPP 10/13/2021'@11'$ :23 AM

## 2021-10-13 NOTE — Progress Notes (Signed)
DISCONTINUE ON PATHWAY REGIMEN - Breast     Cycles 1 through 4: A cycle is every 21 days:     Pembrolizumab      Paclitaxel      Carboplatin      Filgrastim-xxxx    Cycles 5 through 8: A cycle is every 21 days:     Pembrolizumab      Doxorubicin      Cyclophosphamide      Pegfilgrastim-xxxx   **Always confirm dose/schedule in your pharmacy ordering system**  REASON: Other Reason PRIOR TREATMENT: [id number]: Pembrolizumab 200 mg D1 + Paclitaxel 80 mg/m2 D1, 8, 15 + Carboplatin AUC=5 D1 q21 Days x 12 Weeks, Followed by Pembrolizumab 200 mg + Doxorubicin + Cyclophosphamide q21 Days x 12 Weeks, Followed by Surgery TREATMENT RESPONSE: Unable to Evaluate  START ON PATHWAY REGIMEN - Breast     Cycles 1 through 4: A cycle is every 21 days:     Pembrolizumab      Paclitaxel      Carboplatin      Filgrastim-xxxx    Cycles 5 through 8: A cycle is every 21 days:     Pembrolizumab      Doxorubicin      Cyclophosphamide      Pegfilgrastim-xxxx   **Always confirm dose/schedule in your pharmacy ordering system**  Patient Characteristics: Preoperative or Nonsurgical Candidate (Clinical Staging), Neoadjuvant Therapy followed by Surgery, Invasive Disease, Chemotherapy, HER2 Negative/Unknown/Equivocal, ER Negative/Unknown, Platinum Therapy Indicated and Candidate for Checkpoint Inhibitor Therapeutic Status: Preoperative or Nonsurgical Candidate (Clinical Staging) AJCC M Category: cM0 AJCC Grade: G3 Breast Surgical Plan: Neoadjuvant Therapy followed by Surgery ER Status: Negative (-) AJCC 8 Stage Grouping: IIB HER2 Status: Negative (-) AJCC T Category: cT2 AJCC N Category: cN0 PR Status: Negative (-) Intent of Therapy: Curative Intent, Discussed with Patient

## 2021-10-13 NOTE — Progress Notes (Signed)
  Echocardiogram 2D Echocardiogram has been performed.  Wendy Webb 10/13/2021, 10:29 AM

## 2021-10-13 NOTE — Telephone Encounter (Signed)
Patient said she received message from [place] about billing with OOP cost over $100.  Patient not able to reach [facility] by phone (on hold).  Requested [facility] call patient directly.

## 2021-10-16 ENCOUNTER — Other Ambulatory Visit: Payer: Self-pay | Admitting: Pharmacist

## 2021-10-16 NOTE — Progress Notes (Signed)
[facility]       Telephone: [phone]?Fax: [phone]   Oncology Clinical Pharmacist Practitioner Progress Note  Wendy Webb is a 56 y.o. female with a diagnosis of breast cancer that will be starting Keynote-[id number] tentatively on 10/20/21 under the care of Dr. Benay Pike.   Clinical pharmacy was asked by Dr. Chryl Heck to move pembrolizumab/AC portion of treatment to be given first followed by pembrolizumab/paclitaxel/carboplatin.  This has been done and the communication order updated for cycle 5.  Clinical pharmacy will continue to support [name] and Dr. Arletha Pili Iruku as needed.  Raina Mina, RPH-CPP,  10/16/2021  8:26 AM   **Disclaimer: This note was dictated with voice recognition software. Similar sounding words can inadvertently be transcribed and this note may contain transcription errors which may not have been corrected upon publication of note.**

## 2021-10-17 ENCOUNTER — Other Ambulatory Visit: Payer: Self-pay | Admitting: Hematology and Oncology

## 2021-10-17 ENCOUNTER — Other Ambulatory Visit: Payer: Self-pay | Admitting: *Deleted

## 2021-10-17 ENCOUNTER — Other Ambulatory Visit (HOSPITAL_COMMUNITY): Payer: BC Managed Care – PPO

## 2021-10-17 ENCOUNTER — Inpatient Hospital Stay: Payer: BC Managed Care – PPO | Admitting: Licensed Clinical Social Worker

## 2021-10-17 ENCOUNTER — Inpatient Hospital Stay: Payer: BC Managed Care – PPO

## 2021-10-17 ENCOUNTER — Encounter (HOSPITAL_COMMUNITY): Payer: Self-pay | Admitting: General Surgery

## 2021-10-17 DIAGNOSIS — Z171 Estrogen receptor negative status [ER-]: Secondary | ICD-10-CM

## 2021-10-17 DIAGNOSIS — C50411 Malignant neoplasm of upper-outer quadrant of right female breast: Secondary | ICD-10-CM

## 2021-10-17 DIAGNOSIS — R928 Other abnormal and inconclusive findings on diagnostic imaging of breast: Secondary | ICD-10-CM

## 2021-10-17 NOTE — Progress Notes (Signed)
[place] Work  Clinical Social Work was referred by self for questions about disability.  Clinical Social Worker contacted patient by phone  to offer support and assess for needs.   Patient is a Theme park manager, self-employed with her own salon, and wanted to know if she would be able to get a short-term disability since she is starting chemotherapy and this will be a financial strain for her and her husband. CSW provided education on short-term disability versus social security disability. Unfortunately, pt does not already have STD, so will not be able to obtain this. For social security disability, pt may apply but is not guaranteed approval as the standard is being disabled for at least 12 months.  CSW will meet with pt at appt on 7/14 to provide information on other foundations that may be able to provide financial assistance as well as other support services offered.      [place], Pope Worker [employer]

## 2021-10-17 NOTE — Progress Notes (Signed)
PCP - Rosalee Kaufman, PA-C Cardiologist - pt denies EKG - DOS Chest x-ray -  ECHO - 10/13/21 Cardiac Cath -  CPAP -   ERAS Protcol -  COVID TEST-   Anesthesia review: n/a  -------------  SDW INSTRUCTIONS:  Your procedure is scheduled on 7/12. Please report to [facility] Main Entrance "A" at 0800 A.M., and check in at the Admitting office. Call this number if you have problems the morning of surgery: [phone]   Remember: Do not eat or drink after midnight the night before your surgery   Medications to take morning of surgery with a sip of water include: LORazepam (ATIVAN)   As of today, STOP taking any Aspirin (unless otherwise instructed by your surgeon), Aleve, Naproxen, Ibuprofen, Motrin, Advil, Goody's, BC's, all herbal medications, fish oil, and all vitamins.    The Morning of Surgery Do not wear jewelry, make-up or nail polish. Do not wear lotions, powders, or perfumes/colognes, or deodorant Do not bring valuables to the hospital. [facility] is not responsible for any belongings or valuables.  If you are a smoker, DO NOT Smoke 24 hours prior to surgery  If you wear a CPAP at night please bring your mask the morning of surgery   Remember that you must have someone to transport you home after your surgery, and remain with you for 24 hours if you are discharged the same day.  Please bring cases for contacts, glasses, hearing aids, dentures or bridgework because it cannot be worn into surgery.   Patients discharged the day of surgery will not be allowed to drive home.   Please shower the NIGHT BEFORE/MORNING OF SURGERY (use antibacterial soap like DIAL soap if possible). Wear comfortable clothes the morning of surgery. Oral Hygiene is also important to reduce your risk of infection.  Remember - BRUSH YOUR TEETH THE MORNING OF SURGERY WITH YOUR REGULAR TOOTHPASTE  Patient denies shortness of breath, fever, cough and chest pain.

## 2021-10-18 ENCOUNTER — Ambulatory Visit (HOSPITAL_COMMUNITY): Payer: BC Managed Care – PPO

## 2021-10-18 ENCOUNTER — Other Ambulatory Visit: Payer: Self-pay

## 2021-10-18 ENCOUNTER — Ambulatory Visit (HOSPITAL_COMMUNITY): Payer: BC Managed Care – PPO | Admitting: Certified Registered Nurse Anesthetist

## 2021-10-18 ENCOUNTER — Ambulatory Visit (HOSPITAL_COMMUNITY)
Admission: RE | Admit: 2021-10-18 | Discharge: 2021-10-18 | Disposition: A | Discharge status: Home / Self Care | Payer: BC Managed Care – PPO | Attending: General Surgery | Admitting: General Surgery

## 2021-10-18 ENCOUNTER — Encounter (HOSPITAL_COMMUNITY)
Admission: RE | Disposition: A | Discharge status: Home / Self Care | Payer: Self-pay | Source: Home / Self Care | Attending: General Surgery

## 2021-10-18 ENCOUNTER — Encounter (HOSPITAL_COMMUNITY): Payer: Self-pay | Admitting: General Surgery

## 2021-10-18 ENCOUNTER — Encounter: Payer: Self-pay | Admitting: *Deleted

## 2021-10-18 DIAGNOSIS — C50411 Malignant neoplasm of upper-outer quadrant of right female breast: Secondary | ICD-10-CM | POA: Diagnosis not present

## 2021-10-18 DIAGNOSIS — Z171 Estrogen receptor negative status [ER-]: Secondary | ICD-10-CM | POA: Insufficient documentation

## 2021-10-18 DIAGNOSIS — I10 Essential (primary) hypertension: Secondary | ICD-10-CM | POA: Diagnosis not present

## 2021-10-18 DIAGNOSIS — Z8249 Family history of ischemic heart disease and other diseases of the circulatory system: Secondary | ICD-10-CM | POA: Insufficient documentation

## 2021-10-18 HISTORY — DX: Other specified postprocedural states: Z98.890

## 2021-10-18 HISTORY — DX: Other specified postprocedural states: R11.2

## 2021-10-18 HISTORY — PX: PORTACATH PLACEMENT: SHX2246

## 2021-10-18 SURGERY — INSERTION, TUNNELED CENTRAL VENOUS DEVICE, WITH PORT
Anesthesia: General | Site: Chest | Laterality: Left

## 2021-10-18 MED ORDER — ONDANSETRON HCL 4 MG/2ML IJ SOLN
INTRAMUSCULAR | Status: AC
Start: 2021-10-18 — End: ?
  Filled 2021-10-18: qty 2

## 2021-10-18 MED ORDER — DEXAMETHASONE SODIUM PHOSPHATE 10 MG/ML IJ SOLN
INTRAMUSCULAR | Status: DC | PRN
  Administered 2021-10-18: 10 mg via INTRAVENOUS

## 2021-10-18 MED ORDER — CHLORHEXIDINE GLUCONATE CLOTH 2 % EX PADS: 6.0000 | MEDICATED_PAD | Freq: Once | CUTANEOUS | Status: DC

## 2021-10-18 MED ORDER — LIDOCAINE 2% (20 MG/ML) 5 ML SYRINGE
INTRAMUSCULAR | Status: DC | PRN
  Administered 2021-10-18: 60 mg via INTRAVENOUS

## 2021-10-18 MED ORDER — PROPOFOL 10 MG/ML IV BOLUS
INTRAVENOUS | Status: AC
  Filled 2021-10-18: qty 20

## 2021-10-18 MED ORDER — MIDAZOLAM HCL 2 MG/2ML IJ SOLN
INTRAMUSCULAR | Status: DC | PRN
  Administered 2021-10-18: 2 mg via INTRAVENOUS

## 2021-10-18 MED ORDER — FENTANYL CITRATE (PF) 250 MCG/5ML IJ SOLN
INTRAMUSCULAR | Status: AC
  Filled 2021-10-18: qty 5

## 2021-10-18 MED ORDER — MIDAZOLAM HCL 2 MG/2ML IJ SOLN
INTRAMUSCULAR | Status: AC
  Filled 2021-10-18: qty 2

## 2021-10-18 MED ORDER — BUPIVACAINE HCL (PF) 0.25 % IJ SOLN
INTRAMUSCULAR | Status: AC
  Filled 2021-10-18: qty 30

## 2021-10-18 MED ORDER — FENTANYL CITRATE (PF) 100 MCG/2ML IJ SOLN: 25.0000 ug | INTRAMUSCULAR | Status: DC | PRN

## 2021-10-18 MED ORDER — HEPARIN 6000 UNIT IRRIGATION SOLUTION
Status: DC | PRN
  Administered 2021-10-18: 1

## 2021-10-18 MED ORDER — ONDANSETRON HCL 4 MG/2ML IJ SOLN
INTRAMUSCULAR | Status: DC | PRN
  Administered 2021-10-18: 4 mg via INTRAVENOUS

## 2021-10-18 MED ORDER — HEPARIN 6000 UNIT IRRIGATION SOLUTION
Status: AC
  Filled 2021-10-18: qty 500

## 2021-10-18 MED ORDER — GABAPENTIN 300 MG PO CAPS
ORAL_CAPSULE | ORAL | Status: AC
  Administered 2021-10-18: 300 mg via ORAL
  Filled 2021-10-18: qty 1

## 2021-10-18 MED ORDER — HEPARIN SOD (PORK) LOCK FLUSH 100 UNIT/ML IV SOLN
INTRAVENOUS | Status: DC | PRN
  Administered 2021-10-18: 400 [IU]

## 2021-10-18 MED ORDER — BUPIVACAINE HCL (PF) 0.25 % IJ SOLN
INTRAMUSCULAR | Status: DC | PRN
  Administered 2021-10-18: 6 mL

## 2021-10-18 MED ORDER — GABAPENTIN 300 MG PO CAPS: 300.0000 mg | ORAL_CAPSULE | ORAL | Status: AC

## 2021-10-18 MED ORDER — CELECOXIB 200 MG PO CAPS
200.0000 mg | ORAL_CAPSULE | ORAL | Status: AC
  Administered 2021-10-18: 200 mg via ORAL
  Filled 2021-10-18: qty 1

## 2021-10-18 MED ORDER — ACETAMINOPHEN 500 MG PO TABS
1000.0000 mg | ORAL_TABLET | ORAL | Status: AC
  Administered 2021-10-18: 1000 mg via ORAL
  Filled 2021-10-18: qty 2

## 2021-10-18 MED ORDER — FENTANYL CITRATE (PF) 250 MCG/5ML IJ SOLN
INTRAMUSCULAR | Status: DC | PRN
Start: 2021-10-18 — End: 2021-10-18
  Administered 2021-10-18 (×2): 25 ug via INTRAVENOUS

## 2021-10-18 MED ORDER — PROPOFOL 500 MG/50ML IV EMUL
INTRAVENOUS | Status: DC | PRN
  Administered 2021-10-18: 100 ug/kg/min via INTRAVENOUS

## 2021-10-18 MED ORDER — CEFAZOLIN SODIUM-DEXTROSE 2-4 GM/100ML-% IV SOLN
2.0000 g | INTRAVENOUS | Status: AC
  Administered 2021-10-18: 2 g via INTRAVENOUS
  Filled 2021-10-18: qty 100

## 2021-10-18 MED ORDER — HEPARIN SOD (PORK) LOCK FLUSH 100 UNIT/ML IV SOLN
INTRAVENOUS | Status: AC
  Filled 2021-10-18: qty 5

## 2021-10-18 MED ORDER — CHLORHEXIDINE GLUCONATE 0.12 % MT SOLN
OROMUCOSAL | Status: AC
  Filled 2021-10-18: qty 15

## 2021-10-18 MED ORDER — ROCURONIUM BROMIDE 10 MG/ML (PF) SYRINGE
PREFILLED_SYRINGE | INTRAVENOUS | Status: AC
  Filled 2021-10-18: qty 10

## 2021-10-18 MED ORDER — LIDOCAINE 2% (20 MG/ML) 5 ML SYRINGE
INTRAMUSCULAR | Status: AC
  Filled 2021-10-18: qty 5

## 2021-10-18 MED ORDER — PROPOFOL 10 MG/ML IV BOLUS
INTRAVENOUS | Status: DC | PRN
  Administered 2021-10-18: 150 mg via INTRAVENOUS

## 2021-10-18 MED ORDER — LACTATED RINGERS IV SOLN: INTRAVENOUS | Status: DC

## 2021-10-18 MED ORDER — OXYCODONE HCL 5 MG PO TABS: 5.0000 mg | ORAL_TABLET | Freq: Four times a day (QID) | ORAL | 0 refills | Status: DC | PRN

## 2021-10-18 SURGICAL SUPPLY — 37 items
ADH SKN CLS APL DERMABOND .7 (GAUZE/BANDAGES/DRESSINGS) ×1
BAG COUNTER SPONGE SURGICOUNT (BAG) ×1 IMPLANT
BAG DECANTER FOR FLEXI CONT (MISCELLANEOUS) ×2 IMPLANT
BAG SPNG CNTER NS LX DISP (BAG)
CHLORAPREP W/TINT 10.5 ML (MISCELLANEOUS) ×2 IMPLANT
COVER SURGICAL LIGHT HANDLE (MISCELLANEOUS) ×2 IMPLANT
COVER TRANSDUCER ULTRASND GEL (DISPOSABLE) IMPLANT
DERMABOND ADVANCED (GAUZE/BANDAGES/DRESSINGS) ×1
DERMABOND ADVANCED .7 DNX12 (GAUZE/BANDAGES/DRESSINGS) ×1 IMPLANT
DRAPE C-ARM 42X120 X-RAY (DRAPES) ×2 IMPLANT
ELECT CAUTERY BLADE 6.4 (BLADE) ×2 IMPLANT
ELECT REM PT RETURN 9FT ADLT (ELECTROSURGICAL) ×2
ELECTRODE REM PT RTRN 9FT ADLT (ELECTROSURGICAL) ×1 IMPLANT
GAUZE 4X4 16PLY ~~LOC~~+RFID DBL (SPONGE) ×1 IMPLANT
GEL ULTRASOUND 20GR AQUASONIC (MISCELLANEOUS) IMPLANT
GLOVE BIO SURGEON STRL SZ7.5 (GLOVE) ×2 IMPLANT
GOWN STRL REUS W/ TWL LRG LVL3 (GOWN DISPOSABLE) ×2 IMPLANT
GOWN STRL REUS W/TWL LRG LVL3 (GOWN DISPOSABLE) ×4
KIT BASIN OR (CUSTOM PROCEDURE TRAY) ×2 IMPLANT
KIT PORT POWER 8FR ISP CVUE (Port) ×1 IMPLANT
KIT TURNOVER KIT B (KITS) ×2 IMPLANT
NEEDLE 22X1 1/2 (OR ONLY) (NEEDLE) IMPLANT
NS IRRIG 1000ML POUR BTL (IV SOLUTION) ×2 IMPLANT
PAD ARMBOARD 7.5X6 YLW CONV (MISCELLANEOUS) ×2 IMPLANT
PENCIL BUTTON HOLSTER BLD 10FT (ELECTRODE) ×2 IMPLANT
POSITIONER HEAD DONUT 9IN (MISCELLANEOUS) ×2 IMPLANT
SHEATH COOK PEEL AWAY SET 9F (SHEATH) ×1 IMPLANT
SPIKE FLUID TRANSFER (MISCELLANEOUS) ×2 IMPLANT
SUT MNCRL AB 4-0 PS2 18 (SUTURE) ×2 IMPLANT
SUT PROLENE 2 0 SH 30 (SUTURE) ×2 IMPLANT
SUT VIC AB 3-0 SH 27 (SUTURE) ×2
SUT VIC AB 3-0 SH 27XBRD (SUTURE) ×1 IMPLANT
SYR 10ML LL (SYRINGE) ×1 IMPLANT
SYR 5ML LUER SLIP (SYRINGE) ×2 IMPLANT
TOWEL GREEN STERILE (TOWEL DISPOSABLE) ×2 IMPLANT
TOWEL GREEN STERILE FF (TOWEL DISPOSABLE) ×2 IMPLANT
TRAY LAPAROSCOPIC MC (CUSTOM PROCEDURE TRAY) ×2 IMPLANT

## 2021-10-18 NOTE — Op Note (Signed)
10/18/2021  11:37 AM  PATIENT:  Wendy Webb  56 y.o. female  PRE-OPERATIVE DIAGNOSIS:  RIGHT BREAST CANCER  POST-OPERATIVE DIAGNOSIS:  RIGHT BREAST CANCER  PROCEDURE:  Procedure(s): PLA CEMENT OF PORT (Left IJ)  SURGEON:  Surgeon(s) and Role:    * Jovita Kussmaul, MD - Primary  PHYSICIAN ASSISTANT:   ASSISTANTS: none   ANESTHESIA:   local and general  EBL:  20 mL   BLOOD ADMINISTERED:none  DRAINS: none   LOCAL MEDICATIONS USED:  MARCAINE     SPECIMEN:  No Specimen  DISPOSITION OF SPECIMEN:  N/A  COUNTS:  YES  TOURNIQUET:  * No tourniquets in log *  DICTATION: .Dragon Dictation  After informed consent was obtained the patient was brought to the operating room and placed in the supine position on the operating table.  After adequate induction of general anesthesia a roll was placed between the patient's shoulder blades to extend the shoulder slightly.  The left chest and neck area were then prepped with ChloraPrep, allowed to dry, and draped in usual sterile manner.  An appropriate timeout was performed.  The patient was placed in Trendelenburg position.  The area lateral to the bend of the clavicle was infiltrated with quarter percent Marcaine.  A large bore needle from the Port-A-Cath kit was used to slide beneath the bend of the clavicle heading towards the sternal notch and in doing so I was able to access the left subclavian vein without difficulty.  A wire was fed through the needle using the Seldinger technique without difficulty.  The wire was confirmed in the central venous system using real-time fluoroscopy.  Next a small incision was made at the wire entry site on the left chest wall with a 15 blade knife.  This incision was carried through the skin and subcutaneous tissue sharply with the electrocautery.  A subcutaneous pocket was then created by blunt finger dissection inferior to the incision.  Next the tubing was placed on the reservoir.  The reservoir was placed  in the pocket and the length of the tubing was estimated using real-time fluoroscopy.  The tubing was then cut to the appropriate length.  Next a sheath and dilator were fed over the wire using the Seldinger technique without difficulty.  The dilator and wire were removed from the patient.  The tubing was fed through the sheath as far as it would go and then held in place while the sheath was gently cracked and separated.  Another real-time fluoroscopy image showed the tip of the catheter to be in the mid superior vena cava.  The tubing was then permanently anchored to the reservoir.  The reservoir was anchored in the pocket with two 2-0 Prolene stitches.  The port was then aspirated and it aspirated blood easily.  The port was then flushed initially with a dilute heparin solution and then with a more concentrated heparin solution.  The subcutaneous tissue was closed over the port with interrupted 3-0 Vicryl stitches.  The skin was closed with a running 4-0 Monocryl subcuticular stitch.  Dermabond dressings were applied.  The patient tolerated the procedure well.  At the end of the case all needle sponge and instrument counts were correct.  The patient was then awakened and taken to recovery in stable condition.  PLAN OF CARE: Discharge to home after PACU  PATIENT DISPOSITION:  PACU - hemodynamically stable.   Delay start of Pharmacological VTE agent (>24hrs) due to surgical blood loss or risk of bleeding: not  applicable

## 2021-10-18 NOTE — Anesthesia Preprocedure Evaluation (Addendum)
Anesthesia Evaluation  Patient identified by MRN, date of birth, ID band Patient awake    Reviewed: Allergy & Precautions, H&P , NPO status , Patient's Chart, lab work & pertinent test results  History of Anesthesia Complications (+) PONV and history of anesthetic complications  Airway Mallampati: II  TM Distance: >3 FB Neck ROM: Full    Dental no notable dental hx. (+) Teeth Intact, Dental Advisory Given   Pulmonary neg pulmonary ROS,    Pulmonary exam normal breath sounds clear to auscultation       Cardiovascular hypertension,  Rhythm:Regular Rate:Normal     Neuro/Psych negative neurological ROS  negative psych ROS   GI/Hepatic negative GI ROS, Neg liver ROS,   Endo/Other  negative endocrine ROS  Renal/GU negative Renal ROS  negative genitourinary   Musculoskeletal   Abdominal   Peds  Hematology negative hematology ROS (+)   Anesthesia Other Findings   Reproductive/Obstetrics negative OB ROS                            Anesthesia Physical Anesthesia Plan  ASA: 2  Anesthesia Plan: General   Post-op Pain Management: Tylenol PO (pre-op)*, Celebrex PO (pre-op)* and Gabapentin PO (pre-op)*   Induction: Intravenous  PONV Risk Score and Plan: 4 or greater and Ondansetron, Dexamethasone, Midazolam, TIVA and Propofol infusion  Airway Management Planned: LMA  Additional Equipment:   Intra-op Plan:   Post-operative Plan: Extubation in OR  Informed Consent: I have reviewed the patients History and Physical, chart, labs and discussed the procedure including the risks, benefits and alternatives for the proposed anesthesia with the patient or authorized representative who has indicated his/her understanding and acceptance.     Dental advisory given  Plan Discussed with: CRNA  Anesthesia Plan Comments:        Anesthesia Quick Evaluation

## 2021-10-18 NOTE — H&P (Signed)
REFERRING PHYSICIAN: Mel Almond*  PROVIDER: Landry Corporal, MD  MRN: [id number] DOB: July 26, 1965 Subjective   Chief Complaint: Breast Cancer   History of Present Illness: Wendy Webb is a 56 y.o. female who is seen today as an office consultation for evaluation of Breast Cancer .   We are asked to see the patient in consultation by Dr. Chryl Heck to evaluate her for a new right breast cancer. The patient is a 56 year old female who presents with a palpable mass in the right breast. She first felt it in early June. Her mammogram showed a 2.5 cm mass in the upper portion of the right breast. The axilla looked negative. 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 She is otherwise in good health and does not smoke.  Review of Systems: A complete review of systems was obtained from the patient. I have reviewed this information and discussed as appropriate with the patient. See HPI as well for other ROS.  ROS   Medical History: Past Medical History:  Diagnosis Date  Arrhythmia   Patient Active Problem List  Diagnosis  Malignant neoplasm of upper-outer quadrant of right breast in female, estrogen receptor negative (CMS-HCC)   Past Surgical History:  Procedure Laterality Date  CHOLECYSTECTOMY N/A  1994    Allergies  Allergen Reactions  Shellfish Containing Products Nausea And Vomiting   Current Outpatient Medications on File Prior to Visit  Medication Sig Dispense Refill  multivitamin with minerals tablet Take 1 tablet by mouth once daily   No current facility-administered medications on file prior to visit.   Family History  Problem Relation Age of Onset  Skin cancer Mother  Obesity Mother  High blood pressure (Hypertension) Mother  Coronary Artery Disease (Blocked arteries around heart) Mother  Stroke Maternal Grandmother  High blood pressure (Hypertension) Maternal Grandmother     Social History   Tobacco Use  Smoking Status Never  Smokeless Tobacco Never    Social History   Socioeconomic History  Marital status: Married  Tobacco Use  Smoking status: Never  Smokeless tobacco: Never  Vaping Use  Vaping Use: Never used  Substance and Sexual Activity  Alcohol use: Yes  Drug use: Never   Objective:  There were no vitals filed for this visit.  There is no height or weight on file to calculate BMI.  Physical Exam Vitals reviewed.  Constitutional:  General: She is not in acute distress. Appearance: Normal appearance.  HENT:  Head: Normocephalic and atraumatic.  Right Ear: External ear normal.  Left Ear: External ear normal.  Nose: Nose normal.  Mouth/Throat:  Mouth: Mucous membranes are moist.  Pharynx: Oropharynx is clear.  Eyes:  General: No scleral icterus. Extraocular Movements: Extraocular movements intact.  Conjunctiva/sclera: Conjunctivae normal.  Pupils: Pupils are equal, round, and reactive to light.  Cardiovascular:  Rate and Rhythm: Normal rate and regular rhythm.  Pulses: Normal pulses.  Heart sounds: Normal heart sounds.  Pulmonary:  Effort: Pulmonary effort is normal. No respiratory distress.  Breath sounds: Normal breath sounds.  Abdominal:  General: Bowel sounds are normal.  Palpations: Abdomen is soft.  Tenderness: There is no abdominal tenderness.  Musculoskeletal:  General: No swelling, tenderness or deformity. Normal range of motion.  Cervical back: Normal range of motion and neck supple.  Skin: General: Skin is warm and dry.  Coloration: Skin is not jaundiced.  Neurological:  General: No focal deficit present.  Mental  Status: She is alert and oriented to person, place, and time.  Psychiatric:  Mood and Affect: Mood normal.  Behavior: Behavior normal.    Breast: There is a 3 cm mobile palpable mass in the upper portion of the right breast. There are no overlying skin changes. It is not tethered to the chest  wall. There is no palpable mass in the left breast. There is no palpable axillary, supraclavicular, or cervical lymphadenopathy.  Labs, Imaging and Diagnostic Testing:  Assessment and Plan:   Diagnoses and all orders for this visit:  Malignant neoplasm of upper-outer quadrant of right breast in female, estrogen receptor negative (CMS-HCC)    The patient appears to have a 2.5 cm cancer in the upper portion of the right breast with clinically negative lymph nodes. The cancer is a triple negative. Because of this she will need chemotherapy and we feel she would benefit from neoadjuvant therapy. She will need a port for this. I have discussed with her in detail the risks and benefits of the operation to place the port as well as some of the technical aspects and she understands and wishes to proceed. This will give her time to think about the question of lumpectomy versus mastectomy. She may want to meet with plastic surgery to talk about reconstructive options. She will be a good candidate for sentinel node biopsy. I will follow her periodically during her chemotherapy to answer any questions and track her progress.

## 2021-10-18 NOTE — Anesthesia Procedure Notes (Signed)
Procedure Name: LMA Insertion Date/Time: 10/18/2021 10:57 AM  Performed by: Harden Mo, CRNAPre-anesthesia Checklist: Patient identified, Emergency Drugs available, Suction available and Patient being monitored Patient Re-evaluated:Patient Re-evaluated prior to induction Oxygen Delivery Method: Circle System Utilized Preoxygenation: Pre-oxygenation with 100% oxygen Induction Type: IV induction Ventilation: Mask ventilation without difficulty LMA: LMA inserted LMA Size: 3.0 Number of attempts: 1 Airway Equipment and Method: Bite block Placement Confirmation: positive ETCO2 Tube secured with: Tape Dental Injury: Teeth and Oropharynx as per pre-operative assessment

## 2021-10-18 NOTE — Interval H&P Note (Signed)
History and Physical Interval Note:  10/18/2021 10:00 AM  Wendy Webb  has presented today for surgery, with the diagnosis of RIGHT BREAST CANCER.  The various methods of treatment have been discussed with the patient and family. After consideration of risks, benefits and other options for treatment, the patient has consented to  Procedure(s): PORT PLACEMENT WITH ULTRASOUND GUIDANCE (N/A) as a surgical intervention.  The patient's history has been reviewed, patient examined, no change in status, stable for surgery.  I have reviewed the patient's chart and labs.  Questions were answered to the patient's satisfaction.     [name]

## 2021-10-18 NOTE — Transfer of Care (Signed)
Immediate Anesthesia Transfer of Care Note  Patient: Wendy Webb  Procedure(s) Performed: PLA CEMENT OF PORT (Left: Chest)  Patient Location: PACU  Anesthesia Type:General  Level of Consciousness: awake, alert  and oriented  Airway & Oxygen Therapy: Patient Spontanous Breathing  Post-op Assessment: Report given to RN and Post -op Vital signs reviewed and stable  Post vital signs: Reviewed and stable  Last Vitals:  Vitals Value Taken Time  BP 132/84   Temp    Pulse 75   Resp 14   SpO2 100     Last Pain:  Vitals:   10/18/21 0900  TempSrc:   PainSc: 0-No pain         Complications: No notable events documented.

## 2021-10-18 NOTE — Anesthesia Postprocedure Evaluation (Signed)
Anesthesia Post Note  Patient: Wendy Webb  Procedure(s) Performed: PLA CEMENT OF PORT (Left: Chest)     Patient location during evaluation: PACU Anesthesia Type: General Level of consciousness: awake and alert Pain management: pain level controlled Vital Signs Assessment: post-procedure vital signs reviewed and stable Respiratory status: spontaneous breathing, nonlabored ventilation and respiratory function stable Cardiovascular status: blood pressure returned to baseline and stable Postop Assessment: no apparent nausea or vomiting Anesthetic complications: no   No notable events documented.  Last Vitals:  Vitals:   10/18/21 1200 10/18/21 1215  BP: (!) 143/83 (!) 143/80  Pulse: 66 (!) 57  Resp: 14 13  Temp:  37.1 C  SpO2: 100% 100%    Last Pain:  Vitals:   10/18/21 1215  TempSrc:   PainSc: 0-No pain                 Wendy Webb,Wendy Webb

## 2021-10-19 ENCOUNTER — Ambulatory Visit
Admission: RE | Admit: 2021-10-19 | Discharge: 2021-10-19 | Disposition: A | Discharge status: Home / Self Care | Payer: BC Managed Care – PPO | Source: Ambulatory Visit | Attending: Hematology and Oncology | Admitting: Hematology and Oncology

## 2021-10-19 ENCOUNTER — Encounter (HOSPITAL_COMMUNITY): Payer: Self-pay | Admitting: General Surgery

## 2021-10-19 DIAGNOSIS — C50411 Malignant neoplasm of upper-outer quadrant of right female breast: Secondary | ICD-10-CM

## 2021-10-19 DIAGNOSIS — Z171 Estrogen receptor negative status [ER-]: Secondary | ICD-10-CM

## 2021-10-19 DIAGNOSIS — R928 Other abnormal and inconclusive findings on diagnostic imaging of breast: Secondary | ICD-10-CM

## 2021-10-19 MED ORDER — GADOBUTROL 1 MMOL/ML IV SOLN
6.0000 mL | Freq: Once | INTRAVENOUS | Status: AC | PRN
  Administered 2021-10-19: 6 mL via INTRAVENOUS

## 2021-10-20 ENCOUNTER — Inpatient Hospital Stay: Payer: BC Managed Care – PPO

## 2021-10-20 ENCOUNTER — Encounter: Payer: Self-pay | Admitting: *Deleted

## 2021-10-20 ENCOUNTER — Inpatient Hospital Stay (HOSPITAL_BASED_OUTPATIENT_CLINIC_OR_DEPARTMENT_OTHER): Payer: BC Managed Care – PPO | Admitting: Hematology and Oncology

## 2021-10-20 ENCOUNTER — Encounter: Payer: Self-pay | Admitting: Hematology and Oncology

## 2021-10-20 ENCOUNTER — Other Ambulatory Visit: Payer: Self-pay | Admitting: *Deleted

## 2021-10-20 ENCOUNTER — Inpatient Hospital Stay: Payer: BC Managed Care – PPO | Admitting: Licensed Clinical Social Worker

## 2021-10-20 VITALS — BP 129/80 | HR 64 | Resp 16

## 2021-10-20 DIAGNOSIS — I10 Essential (primary) hypertension: Secondary | ICD-10-CM | POA: Diagnosis not present

## 2021-10-20 DIAGNOSIS — Z79899 Other long term (current) drug therapy: Secondary | ICD-10-CM | POA: Diagnosis not present

## 2021-10-20 DIAGNOSIS — Z800 Family history of malignant neoplasm of digestive organs: Secondary | ICD-10-CM | POA: Diagnosis not present

## 2021-10-20 DIAGNOSIS — Z95828 Presence of other vascular implants and grafts: Secondary | ICD-10-CM | POA: Insufficient documentation

## 2021-10-20 DIAGNOSIS — K589 Irritable bowel syndrome without diarrhea: Secondary | ICD-10-CM | POA: Diagnosis not present

## 2021-10-20 DIAGNOSIS — Z171 Estrogen receptor negative status [ER-]: Secondary | ICD-10-CM | POA: Diagnosis not present

## 2021-10-20 DIAGNOSIS — C50411 Malignant neoplasm of upper-outer quadrant of right female breast: Secondary | ICD-10-CM | POA: Diagnosis not present

## 2021-10-20 DIAGNOSIS — T451X5A Adverse effect of antineoplastic and immunosuppressive drugs, initial encounter: Secondary | ICD-10-CM | POA: Diagnosis not present

## 2021-10-20 DIAGNOSIS — Z5111 Encounter for antineoplastic chemotherapy: Secondary | ICD-10-CM | POA: Diagnosis not present

## 2021-10-20 DIAGNOSIS — Z5112 Encounter for antineoplastic immunotherapy: Secondary | ICD-10-CM | POA: Diagnosis not present

## 2021-10-20 DIAGNOSIS — I340 Nonrheumatic mitral (valve) insufficiency: Secondary | ICD-10-CM | POA: Diagnosis not present

## 2021-10-20 DIAGNOSIS — R000 Tachycardia, unspecified: Secondary | ICD-10-CM | POA: Diagnosis not present

## 2021-10-20 DIAGNOSIS — Z803 Family history of malignant neoplasm of breast: Secondary | ICD-10-CM | POA: Diagnosis not present

## 2021-10-20 DIAGNOSIS — D702 Other drug-induced agranulocytosis: Secondary | ICD-10-CM | POA: Diagnosis not present

## 2021-10-20 DIAGNOSIS — Z5189 Encounter for other specified aftercare: Secondary | ICD-10-CM | POA: Diagnosis not present

## 2021-10-20 DIAGNOSIS — Z7952 Long term (current) use of systemic steroids: Secondary | ICD-10-CM | POA: Diagnosis not present

## 2021-10-20 HISTORY — PX: BREAST BIOPSY: SHX20

## 2021-10-20 HISTORY — DX: Presence of other vascular implants and grafts: Z95.828

## 2021-10-20 LAB — CMP (CANCER CENTER ONLY)
ALT: 23 U/L (ref 0–44)
AST: 19 U/L (ref 15–41)
Albumin: 4.2 g/dL (ref 3.5–5.0)
Alkaline Phosphatase: 52 U/L (ref 38–126)
Anion gap: 5 (ref 5–15)
BUN: 12 mg/dL (ref 6–20)
CO2: 29 mmol/L (ref 22–32)
Calcium: 9.2 mg/dL (ref 8.9–10.3)
Chloride: 105 mmol/L (ref 98–111)
Creatinine: 0.61 mg/dL (ref 0.44–1.00)
GFR, Estimated: >60 mL/min (ref >60)
Glucose, Bld: 120 mg/dL — ABNORMAL HIGH (ref 70–99)
Potassium: 3.8 mmol/L (ref 3.5–5.1)
Sodium: 139 mmol/L (ref 135–145)
Total Bilirubin: 0.5 mg/dL (ref 0.3–1.2)
Total Protein: 7.1 g/dL (ref 6.5–8.1)

## 2021-10-20 LAB — CBC WITH DIFFERENTIAL (CANCER CENTER ONLY)
Abs Immature Granulocytes: 0.01 10*3/uL (ref 0.00–0.07)
Basophils Absolute: 0 10*3/uL (ref 0.0–0.1)
Basophils Relative: 0 %
Eosinophils Absolute: 0 10*3/uL (ref 0.0–0.5)
Eosinophils Relative: 1 %
HCT: 36 % (ref 36.0–46.0)
Hemoglobin: 12.3 g/dL (ref 12.0–15.0)
Immature Granulocytes: 0 %
Lymphocytes Relative: 22 %
Lymphs Abs: 1.7 10*3/uL (ref 0.7–4.0)
MCH: 31.9 pg (ref 26.0–34.0)
MCHC: 34.2 g/dL (ref 30.0–36.0)
MCV: 93.5 fL (ref 80.0–100.0)
Monocytes Absolute: 0.4 10*3/uL (ref 0.1–1.0)
Monocytes Relative: 6 %
Neutro Abs: 5.4 10*3/uL (ref 1.7–7.7)
Neutrophils Relative %: 71 %
Platelet Count: 246 10*3/uL (ref 150–400)
RBC: 3.85 MIL/uL — ABNORMAL LOW (ref 3.87–5.11)
RDW: 12.5 % (ref 11.5–15.5)
WBC Count: 7.6 10*3/uL (ref 4.0–10.5)
nRBC: 0 % (ref 0.0–0.2)

## 2021-10-20 MED ORDER — PALONOSETRON HCL INJECTION 0.25 MG/5ML
0.2500 mg | Freq: Once | INTRAVENOUS | Status: AC
  Administered 2021-10-20: 0.25 mg via INTRAVENOUS
  Filled 2021-10-20: qty 5

## 2021-10-20 MED ORDER — SODIUM CHLORIDE 0.9% FLUSH
10.0000 mL | INTRAVENOUS | Status: DC | PRN
  Administered 2021-10-20: 10 mL

## 2021-10-20 MED ORDER — SODIUM CHLORIDE 0.9% FLUSH
10.0000 mL | Freq: Once | INTRAVENOUS | Status: AC
  Administered 2021-10-20: 10 mL

## 2021-10-20 MED ORDER — DEXAMETHASONE 4 MG PO TABS: ORAL_TABLET | ORAL | 1 refills | Status: DC

## 2021-10-20 MED ORDER — DOXORUBICIN HCL CHEMO IV INJECTION 2 MG/ML
60.0000 mg/m2 | Freq: Once | INTRAVENOUS | Status: AC
  Administered 2021-10-20: 108 mg via INTRAVENOUS
  Filled 2021-10-20: qty 54

## 2021-10-20 MED ORDER — SODIUM CHLORIDE 0.9 % IV SOLN
10.0000 mg | Freq: Once | INTRAVENOUS | Status: AC
  Administered 2021-10-20: 10 mg via INTRAVENOUS
  Filled 2021-10-20: qty 10

## 2021-10-20 MED ORDER — SODIUM CHLORIDE 0.9 % IV SOLN
600.0000 mg/m2 | Freq: Once | INTRAVENOUS | Status: AC
  Administered 2021-10-20: 1080 mg via INTRAVENOUS
  Filled 2021-10-20: qty 54

## 2021-10-20 MED ORDER — SODIUM CHLORIDE 0.9 % IV SOLN: Freq: Once | INTRAVENOUS | Status: AC

## 2021-10-20 MED ORDER — LIDOCAINE-PRILOCAINE 2.5-2.5 % EX CREA: TOPICAL_CREAM | CUTANEOUS | 3 refills | Status: DC

## 2021-10-20 MED ORDER — SODIUM CHLORIDE 0.9 % IV SOLN
150.0000 mg | Freq: Once | INTRAVENOUS | Status: AC
  Administered 2021-10-20: 150 mg via INTRAVENOUS
  Filled 2021-10-20: qty 150

## 2021-10-20 MED ORDER — PROCHLORPERAZINE MALEATE 10 MG PO TABS: 10.0000 mg | ORAL_TABLET | Freq: Four times a day (QID) | ORAL | 1 refills | Status: DC | PRN

## 2021-10-20 MED ORDER — ONDANSETRON HCL 8 MG PO TABS: 8.0000 mg | ORAL_TABLET | Freq: Two times a day (BID) | ORAL | 1 refills | Status: DC | PRN

## 2021-10-20 MED ORDER — SODIUM CHLORIDE 0.9 % IV SOLN
200.0000 mg | Freq: Once | INTRAVENOUS | Status: AC
  Administered 2021-10-20: 200 mg via INTRAVENOUS
  Filled 2021-10-20: qty 200

## 2021-10-20 MED ORDER — HEPARIN SOD (PORK) LOCK FLUSH 100 UNIT/ML IV SOLN
500.0000 [IU] | Freq: Once | INTRAVENOUS | Status: AC | PRN
  Administered 2021-10-20: 500 [IU]

## 2021-10-20 NOTE — Patient Instructions (Signed)
[facility]COL[facility]  Discharge Instructions: Thank you for choosing [facility] to provide your oncology and hematology care.   If you have a lab appointment with the [facility], please go directly to the [facility] and check in at the registration area.   Wear comfortable clothing and clothing appropriate for easy access to any Portacath or PICC line.   We strive to give you quality time with your provider. You may need to reschedule your appointment if you arrive late (15 or more minutes).  Arriving late affects you and other patients whose appointments are after yours.  Also, if you miss three or more appointments without notifying the office, you may be dismissed from the clinic at the provider's discretion.      For prescription refill requests, have your pharmacy contact our office and allow 72 hours for refills to be completed.    Today you received the following chemotherapy and/or immunotherapy agents: Keytruda/Adriamycin/Cytoxan      To help prevent nausea and vomiting after your treatment, we encourage you to take your nausea medication as directed.  BELOW ARE SYMPTOMS THAT SHOULD BE REPORTED IMMEDIATELY: *FEVER GREATER THAN 100.4 F (38 C) OR HIGHER *CHILLS OR SWEATING *NAUSEA AND VOMITING THAT IS NOT CONTROLLED WITH YOUR NAUSEA MEDICATION *UNUSUAL SHORTNESS OF BREATH *UNUSUAL BRUISING OR BLEEDING *URINARY PROBLEMS (pain or burning when urinating, or frequent urination) *BOWEL PROBLEMS (unusual diarrhea, constipation, pain near the anus) TENDERNESS IN MOUTH AND THROAT WITH OR WITHOUT PRESENCE OF ULCERS (sore throat, sores in mouth, or a toothache) UNUSUAL RASH, SWELLING OR PAIN  UNUSUAL VAGINAL DISCHARGE OR ITCHING   Items with * indicate a potential emergency and should be followed up as soon as possible or go to the Emergency Department if any problems should occur.  Please show the CHEMOTHERAPY ALERT CARD or IMMUNOTHERAPY ALERT  CARD at check-in to the Emergency Department and triage nurse.  Should you have questions after your visit or need to cancel or reschedule your appointment, please contact [facility]  Dept: [phone]  and follow the prompts.  Office hours are 8:00 a.m. to 4:30 p.m. Monday - Friday. Please note that voicemails left after 4:00 p.m. may not be returned until the following business day.  We are closed weekends and major holidays. You have access to a nurse at all times for urgent questions. Please call the main number to the clinic Dept: [phone] and follow the prompts.   For any non-urgent questions, you may also contact your provider using MyChart. We now offer e-Visits for anyone 61 and older to request care online for non-urgent symptoms. For details visit [email or web address].   Also download the MyChart app! Go to the app store, search "MyChart", open the app, select [facility], and log in with your MyChart username and password.  Masks are optional in the cancer centers. If you would like for your care team to wear a mask while they are taking care of you, please let them know. For doctor visits, patients may have with them one support person who is at least 55 years old. At this time, visitors are not allowed in the infusion area.

## 2021-10-20 NOTE — Progress Notes (Signed)
[facility] NOTE  Patient Care Team: Rosine Door as PCP - General (Physician Assistant) Rockwell Germany, RN as Oncology Nurse Navigator Mauro Kaufmann, RN as Oncology Nurse Navigator Jovita Kussmaul, MD as Consulting Physician (General Surgery) Benay Pike, MD as Consulting Physician (Hematology and Oncology) Kyung Rudd, MD as Consulting Physician (Radiation Oncology)  CHIEF COMPLAINTS/PURPOSE OF CONSULTATION:  Newly diagnosed breast cancer, here before planned cycle 1 of chemotherapy  HISTORY OF PRESENTING ILLNESS:  Wendy Webb 56 y.o. female is here because of recent diagnosis of right breast IDC.  I reviewed her records extensively and collaborated the history with the patient.  SUMMARY OF ONCOLOGIC HISTORY: Oncology History  Malignant neoplasm of upper-outer quadrant of right breast in female, estrogen receptor negative ([place])  09/25/2021 Imaging   Bilateral screening mammogram with irregular hypoechoic mass in the right breast at 12:00 axis, 5 cm from nipple measuring 2.5 cm corresponding to the palpable area of concern. Ultrasound confirmed irregular hypoechoic mass in the right breast at the 12:00 5 cm from the nipple measuring 2.5 cm corresponding to the palpable area of concern.   09/26/2021 Pathology Results   Right breast needle core biopsy showed invasive ductal carcinoma, high-grade prognostic showed ER 0% negative PR 0% negative Ki-67 of 95% and HER2 negative   09/28/2021 Initial Diagnosis   Malignant neoplasm of upper-outer quadrant of right breast in female, estrogen receptor negative ([place])   10/20/2021 -  Chemotherapy   Patient is on Treatment Plan : BREAST  Pembrolizumab (200) D1 + AC D1 q21d x 4 cycles / Pembrolizumab (200) D1 + Carboplatin (1.5) D1,8,15 + Paclitaxel (80) D1,8,15 q21d X 4 cycles     01/12/2022 - 01/12/2022 Chemotherapy   Patient is on Treatment Plan : BREAST Pembrolizumab (200) D1 + Carboplatin (5) D1 +  Paclitaxel (80) D1,8,15 q21d X 4 cycles / Pembrolizumab (200) D1 + AC D1 q21d x 4 cycles      Interval history Wendy Webb is here with her husband before planned cycle 1 of chemotherapy.  She denies any new health complaints today.  She had her port placed for access.  She had additional biopsies yesterday which did not show any evidence of malignancy. Rest of the pertinent 10 point ROS reviewed and negative  MEDICAL HISTORY:  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[place])    09/26/2021 (right breast biopsy)   Mild mitral regurgitation 06/2019   Palpitations    PONV (postoperative nausea and vomiting)     SURGICAL HISTORY: Past Surgical History:  Procedure Laterality Date   CHOLECYSTECTOMY     GALLBLADDER SURGERY     PORTACATH PLACEMENT Left 10/18/2021   Procedure: PLA CEMENT OF PORT;  Surgeon: Jovita Kussmaul, MD;  Location[facility];  Service: General;  Laterality: Left;    SOCIAL HISTORY: Social History   Socioeconomic History   Marital status: Married    Spouse name: Not on file   Number of children: 2   Years of education: Not on file   Highest education level: Not on file  Occupational History    Comment: Hair dresser  Tobacco Use   Smoking status: Never   Smokeless tobacco: Never  Vaping Use   Vaping Use: Never used  Substance and Sexual Activity   Alcohol use: Yes    Alcohol/week: 0.0 standard  drinks of alcohol    Comment: Glass wine per day   Drug use: No   Sexual activity: Yes    Partners: Male  Other Topics Concern   Not on file  Social History Narrative   Pt is a hair stylist   Married (3rd marriage)   2 sons (both grown)   Enjoys yard work, hiking, Scientist, clinical (histocompatibility and immunogenetics)   Complete 2 year cosmetology   Has one Neurosurgeon   Social Determinants of Radio broadcast assistant Strain: Not on Art therapist Insecurity: Not on file   Transportation Needs: Not on file  Physical Activity: Not on file  Stress: Not on file  Social Connections: Not on file  Intimate Partner Violence: Not on file    FAMILY HISTORY: Family History  Problem Relation Age of Onset   Hypertension Mother    Heart attack Mother    Heart disease Mother        Atrial fibrillation   Obstructive Sleep Apnea Mother    Hypertension Brother    Stroke Maternal Grandmother 75   Breast cancer Other 88       PGM's sister   Colon cancer Neg Hx    Esophageal cancer Neg Hx     ALLERGIES:  is allergic to ace inhibitors and shellfish allergy.  MEDICATIONS:  Current Outpatient Medications  Medication Sig Dispense Refill   cholecalciferol (VITAMIN D3) 25 MCG (1000 UNIT) tablet Take 1,000 Units by mouth daily.     cyanocobalamin 1000 MCG tablet Take 1,000 mcg by mouth daily.     LORazepam (ATIVAN) 0.5 MG tablet Take 0.25 mg by mouth every morning.     Misc Natural Products (TART CHERRY ADVANCED PO) Take 1 tablet by mouth daily.     Multiple Vitamins-Minerals (MULTIVIT/MULTIMINERAL ADULT) LIQD Take 30 mLs by mouth daily.     oxyCODONE (ROXICODONE) 5 MG immediate release tablet Take 1 tablet (5 mg total) by mouth every 6 (six) hours as needed for severe pain. 5 tablet 0   No current facility-administered medications for this visit.   Facility-Administered Medications Ordered in Other Visits  Medication Dose Route Frequency Provider Last Rate Last Admin   cyclophosphamide (CYTOXAN) 1,080 mg in sodium chloride 0.9 % 250 mL chemo infusion  600 mg/m2 (Treatment Plan Recorded) Intravenous Once Margareth Kanner, Arletha Pili, MD       DOXOrubicin (ADRIAMYCIN) chemo injection 108 mg  60 mg/m2 (Treatment Plan Recorded) Intravenous Once Vanderbilt Ranieri, Arletha Pili, MD       heparin lock flush 100 unit/mL  500 Units Intracatheter Once PRN Clydine Parkison, MD       sodium chloride flush (NS) 0.9 % injection 10 mL  10 mL Intracatheter PRN Emerson Barretto, MD        REVIEW OF SYSTEMS:    Constitutional: Denies fevers, chills or abnormal night sweats Eyes: Denies blurriness of vision, double vision or watery eyes Ears, nose, mouth, throat, and face: Denies mucositis or sore throat Respiratory: Denies cough, dyspnea or wheezes Cardiovascular: Denies palpitation, chest discomfort or lower extremity swelling Gastrointestinal:  Denies nausea, heartburn or change in bowel habits Skin: Denies abnormal skin rashes Lymphatics: Denies new lymphadenopathy or easy bruising Neurological:Denies numbness, tingling or new weaknesses Behavioral/Psych: Mood is stable, no new changes  Breast: Denies any palpable lumps or discharge All other systems were reviewed with the patient and are negative.  PHYSICAL EXAMINATION: ECOG PERFORMANCE STATUS: 0 - Asymptomatic  Vitals:   10/20/21 1218  BP: 138/75  Pulse: 73  Resp: 16  Temp: 97.9  F (36.6 C)  SpO2: 100%   Filed Weights   10/20/21 1218  Weight: 147 lb 8 oz (66.9 kg)    GENERAL:alert, no distress and comfortable SKIN: skin color, texture, turgor are normal, no rashes or significant lesions EYES: normal, conjunctiva are pink and non-injected, sclera clear OROPHARYNX:no exudate, no erythema and lips, buccal mucosa, and tongue normal  NECK: supple, thyroid normal size, non-tender, without nodularity LYMPH:  no palpable lymphadenopathy in the cervical, axillary or inguinal LUNGS: clear to auscultation and percussion with normal breathing effort HEART: regular rate & rhythm and no murmurs and no lower extremity edema ABDOMEN:abdomen soft, non-tender and normal bowel sounds Musculoskeletal:no cyanosis of digits and no clubbing  PSYCH: alert & oriented x 3 with fluent speech NEURO: no focal motor/sensory deficits BREAST: Breast exam deferred today   LABORATORY DATA:  I have reviewed the data as listed Lab Results  Component Value Date   WBC 7.6 10/20/2021   HGB 12.3 10/20/2021   HCT 36.0 10/20/2021   MCV 93.5 10/20/2021    PLT 246 10/20/2021   Lab Results  Component Value Date   NA 139 10/20/2021   K 3.8 10/20/2021   CL 105 10/20/2021   CO2 29 10/20/2021    RADIOGRAPHIC STUDIES: I have personally reviewed the radiological reports and agreed with the findings in the report.  ASSESSMENT AND PLAN:  Malignant neoplasm of upper-outer quadrant of right breast in female, estrogen receptor negative ([place]) This is a very pleasant 56 year-old female patient with newly diagnosed left breast mass status post imaging and biopsy with pathology showing invasive poorly differentiated adenocarcinoma, grade 3, triple negative, high proliferation index of 95 percent presented in the breast [place] for additional recommendations.  Given triple negative tumor larger than 2 cm at diagnosis, we have discussed about considering neoadjuvant chemotherapy.  Today we have discussed about keynote 522 trial and the role of chemoimmunotherapy in the neoadjuvant setting for triple negative breast cancer.   We have discussed about the regimen, adverse effects of chemotherapy including but not limited to fatigue, nausea, vomiting, diarrhea, cytopenias, cardiotoxicity, neuropathy, risk of infections, alopecia, immunotherapy related adverse effects.  She understands that some of the side effects from chemoimmunotherapy can be life-threatening, and permanent.   We have discussed excellent complete pathologic response with neoadjuvant chemoimmunotherapy and triple negative breast cancers.  Following neoadjuvant chemotherapy, she will proceed with surgery and adjuvant immunotherapy depending on tolerance.  There is no role for antiestrogen therapy in this setting.    She is here before planned cycle 1 of Adriamycin, cyclophosphamide and Keytruda.  She denies any new health complaints since her last visit.  She had some additional right breast biopsies yesterday which did not show any evidence of malignancy.  CBC and CMP reviewed, satisfactory to proceed  with treatment.  She will return to clinic before her next planned cycle of treatment.  She was strongly encouraged to call us with any intractable symptoms.  She will take nausea medication as needed, went over these medications once again.  We have once again discussed about arthralgias from the growth factor, she can use Claritin and Tylenol as recommended.  Total time spent: 30 minutes including history, physical, review of records, counseling and coordination of care All questions were answered. The patient knows to call the clinic with any problems, questions or concerns.    Benay Pike, MD 10/20/21

## 2021-10-20 NOTE — Assessment & Plan Note (Signed)
This is a very pleasant 55 year-old female patient with newly diagnosed left breast mass status post imaging and biopsy with pathology showing invasive poorly differentiated adenocarcinoma, grade 3, triple negative, high proliferation index of 95 percent presented in the breast [facility] for additional recommendations.  Given triple negative tumor larger than 2 cm at diagnosis, we have discussed about considering neoadjuvant chemotherapy.  Today we have discussed about keynote 522 trial and the role of chemoimmunotherapy in the neoadjuvant setting for triple negative breast cancer.   We have discussed about the regimen, adverse effects of chemotherapy including but not limited to fatigue, nausea, vomiting, diarrhea, cytopenias, cardiotoxicity, neuropathy, risk of infections, alopecia, immunotherapy related adverse effects.  She understands that some of the side effects from chemoimmunotherapy can be life-threatening, and permanent.   We have discussed excellent complete pathologic response with neoadjuvant chemoimmunotherapy and triple negative breast cancers.  Following neoadjuvant chemotherapy, she will proceed with surgery and adjuvant immunotherapy depending on tolerance.  There is no role for antiestrogen therapy in this setting.    She is here before planned cycle 1 of Adriamycin, cyclophosphamide and Keytruda.  She denies any new health complaints since her last visit.  She had some additional right breast biopsies yesterday which did not show any evidence of malignancy.  CBC and CMP reviewed, satisfactory to proceed with treatment.  She will return to clinic before her next planned cycle of treatment.  She was strongly encouraged to call us with any intractable symptoms.  She will take nausea medication as needed, went over these medications once again.  We have once again discussed about arthralgias from the growth factor, she can use Claritin and Tylenol as recommended. 

## 2021-10-20 NOTE — Progress Notes (Signed)
[facility] CSW Progress Note  Holiday representative met with patient and pt's spouse to review potential resources for financial assistance through cancer foundations. Based on income criteria, pt will not be eligible for those resources.  CSW also provided information on other support services available and gave monthly calendar of events. No other needs or questions at this time.    Patsy E Tremar Wickens, LCSW

## 2021-10-23 ENCOUNTER — Encounter: Payer: Self-pay | Admitting: Hematology and Oncology

## 2021-10-23 ENCOUNTER — Encounter: Payer: Self-pay | Admitting: Genetic Counselor

## 2021-10-23 ENCOUNTER — Inpatient Hospital Stay: Payer: BC Managed Care – PPO

## 2021-10-23 ENCOUNTER — Telehealth: Payer: Self-pay | Admitting: *Deleted

## 2021-10-23 ENCOUNTER — Ambulatory Visit: Payer: Self-pay | Admitting: Genetic Counselor

## 2021-10-23 ENCOUNTER — Telehealth: Payer: Self-pay | Admitting: Genetic Counselor

## 2021-10-23 ENCOUNTER — Other Ambulatory Visit: Payer: Self-pay

## 2021-10-23 VITALS — BP 131/78 | HR 79 | Temp 98.4°F | Resp 18

## 2021-10-23 DIAGNOSIS — C50411 Malignant neoplasm of upper-outer quadrant of right female breast: Secondary | ICD-10-CM

## 2021-10-23 DIAGNOSIS — Z1379 Encounter for other screening for genetic and chromosomal anomalies: Secondary | ICD-10-CM | POA: Insufficient documentation

## 2021-10-23 DIAGNOSIS — Z803 Family history of malignant neoplasm of breast: Secondary | ICD-10-CM

## 2021-10-23 MED ORDER — PEGFILGRASTIM-CBQV 6 MG/0.6ML ~~LOC~~ SOSY
6.0000 mg | PREFILLED_SYRINGE | Freq: Once | SUBCUTANEOUS | Status: AC
  Administered 2021-10-23: 6 mg via SUBCUTANEOUS
  Filled 2021-10-23: qty 0.6

## 2021-10-23 NOTE — Telephone Encounter (Signed)
Called pt to see how she did with her treatment.  She reports doing well other than a little weak.  She had her injection this am & took her claritin/tylenol & knows to cont claritin for @ 7 days.  She states she knows how to reach [place] if needed & knows her appts.

## 2021-10-23 NOTE — Progress Notes (Signed)
Met w/ pt to introduce myself as her Arboriculturist, discuss copay assistance and the [employer].  Pt wants to apply and gave me consent to apply in her behalf so I completed the [employer] Access enrollment application for [place], got the pt's and Dr. Rob Hickman signature and faxed for processing.  I will notify her of the outcome once received.  I completed the online app w/ [employer] for Udenyca and she was approved for $15,000 per calendar year from 10/23/21.  Pt is eligible to have $0 OOP costs for each Udenyca.  I also informed her of the [employer], went over what it covers and gave her the income requirement.  Pt is overqualified for that grant.  She has my card for any questions or concerns she may have in the future.

## 2021-10-23 NOTE — Progress Notes (Signed)
HPI:   Ms. 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 Please refer to our prior cancer genetics clinic note for more information regarding our discussion, assessment and recommendations, at the time. Ms. Wachter recent genetic test results were disclosed to her, as were recommendations warranted by these results. These results and recommendations are discussed in more detail below.  CANCER HISTORY:  Oncology History  Malignant neoplasm of upper-outer quadrant of right breast in female, estrogen receptor negative ([place])  09/25/2021 Imaging   Bilateral screening mammogram with irregular hypoechoic mass in the right breast at 12:00 axis, 5 cm from nipple measuring 2.5 cm corresponding to the palpable area of concern. Ultrasound confirmed irregular hypoechoic mass in the right breast at the 12:00 5 cm from the nipple measuring 2.5 cm corresponding to the palpable area of concern.   09/26/2021 Pathology Results   Right breast needle core biopsy showed invasive ductal carcinoma, high-grade prognostic showed ER 0% negative PR 0% negative Ki-67 of 95% and HER2 negative   09/28/2021 Initial Diagnosis   Malignant neoplasm of upper-outer quadrant of right breast in female, estrogen receptor negative ([place])   10/16/2021 Genetic Testing   Negative hereditary cancer genetic testing: no pathogenic variants detected in [facility] CustomNext-Cancer +RNAinsight Panel.  Report date is October 16, 2021.   The CustomNext-Cancer+RNAinsight panel offered by [facility] includes sequencing and rearrangement analysis for the following 47 genes:  APC, ATM, AXIN2, BARD1, BMPR1A, BRCA1, BRCA2, BRIP1, CDH1, CDK4, CDKN2A, CHEK2, DICER1, EPCAM, GREM1, HOXB13, MEN1, MLH1, MSH2, MSH3, MSH6, MUTYH, NBN, NF1, NF2, NTHL1, PALB2, PMS2, POLD1, POLE, PTEN, RAD51C, RAD51D, RECQL, RET, SDHA, SDHAF2, SDHB,  SDHC, SDHD, SMAD4, SMARCA4, STK11, TP53, TSC1, TSC2, and VHL.  RNA data is routinely analyzed for use in variant interpretation for all genes.    10/20/2021 -  Chemotherapy   Patient is on Treatment Plan : BREAST  Pembrolizumab (200) D1 + AC D1 q21d x 4 cycles / Pembrolizumab (200) D1 + Carboplatin (1.5) D1,8,15 + Paclitaxel (80) D1,8,15 q21d X 4 cycles     01/12/2022 - 01/12/2022 Chemotherapy   Patient is on Treatment Plan : BREAST Pembrolizumab (200) D1 + Carboplatin (5) D1 + Paclitaxel (80) D1,8,15 q21d X 4 cycles / Pembrolizumab (200) D1 + AC D1 q21d x 4 cycles       FAMILY HISTORY:  We obtained a detailed, 4-generation family history.  Significant diagnoses are listed below:      Family History  Problem Relation Age of Onset   Breast cancer Other 57        PGM's sister     Ms. Keetch is unaware of previous family history of genetic testing for hereditary cancer risks. There is no reported Ashkenazi Jewish ancestry. There is no known consanguinity.    GENETIC TEST RESULTS:  The [facility] CustomNext-Cancer +RNAinsight Panel found no pathogenic mutations.   The CustomNext-Cancer+RNAinsight panel offered by [facility] includes sequencing and rearrangement analysis for the following 47 genes:  APC, ATM, AXIN2, BARD1, BMPR1A, BRCA1, BRCA2, BRIP1, CDH1, CDK4, CDKN2A, CHEK2, DICER1, EPCAM, GREM1, HOXB13, MEN1, MLH1, MSH2, MSH3, MSH6, MUTYH, NBN, NF1, NF2, NTHL1, PALB2, PMS2, POLD1, POLE, PTEN, RAD51C, RAD51D, RECQL, RET, SDHA, SDHAF2, SDHB, SDHC, SDHD, SMAD4, SMARCA4, STK11, TP53, TSC1, TSC2, and VHL.  RNA data is routinely analyzed for use in variant interpretation for all genes. .   The test report has been scanned into  EPIC and is located under the Molecular Pathology section of the Results Review tab.  A portion of the result report is included below for reference. Genetic testing reported out on October 16, 2021.      Even though a pathogenic variant was not identified, possible  explanations for the cancer in the family may include: There may be no hereditary risk for cancer in the family. The cancers in Ms. Starks may be sporadic/familial or due to other genetic and environmental factors. There may be a gene mutation in one of these genes that current testing methods cannot detect but that chance is small. There could be another gene that has not yet been discovered, or that we have not yet tested, that is responsible for the cancer diagnoses in the family.    Therefore, it is important to remain in touch with cancer genetics in the future so that we can continue to offer Ms. Lance the most up to date genetic testing.   ADDITIONAL GENETIC TESTING:  We discussed with Ms. Mankowski that her genetic testing was fairly extensive.  If there are additional relevant genes identified to increase cancer risk that can be analyzed in the future, we would be happy to discuss and coordinate this testing at that time.     CANCER SCREENING RECOMMENDATIONS:  Ms. Pendley test result is considered negative (normal).  This means that we have not identified a hereditary cause for her personal history of triple negative breast cancer at this time.   An individual's cancer risk and medical management are not determined by genetic test results alone. Overall cancer risk assessment incorporates additional factors, including personal medical history, family history, and any available genetic information that may result in a personalized plan for cancer prevention and surveillance. Therefore, it is recommended she continue to follow the cancer management and screening guidelines provided by her oncology and primary healthcare provider.  RECOMMENDATIONS FOR FAMILY MEMBERS:   Since she did not inherit a identifiable mutation in a cancer predisposition gene included on this panel, her children could not have inherited a known mutation from her in one of these genes. Individuals in this family might be at  some increased risk of developing cancer, over the general population risk, due to the family history of cancer.  Individuals in the family should notify their providers of the family history of cancer. We recommend women in this family have a yearly mammogram beginning at age 44, or 109 years younger than the earliest onset of cancer, an annual clinical breast exam, and perform monthly breast self-exams.    FOLLOW-UP:  Lastly, we discussed with Ms. Cassidy that cancer genetics is a rapidly advancing field and it is possible that new genetic tests will be appropriate for her and/or her family members in the future. We encouraged her to remain in contact with cancer genetics on an annual basis so we can update her personal and family histories and let her know of advances in cancer genetics that may benefit this family.   Our contact number was provided. Ms. Gemmer questions were answered to her satisfaction, and she knows she is welcome to call us at anytime with additional questions or concerns.   Urian Martenson M. Joette Catching, [place], [facility] Genetic Counselor [email or web address] (P) [phone]

## 2021-10-23 NOTE — Patient Instructions (Signed)

## 2021-10-23 NOTE — Telephone Encounter (Signed)
Revealed negative genetic testing.  Discussed that we do not know why she has breast cancer or why there is cancer in the family. It could be sporadic/famillial, due to a different gene that we are not testing, or maybe our current technology may not be able to pick something up.  It will be important for her to keep in contact with genetics to keep up with whether additional testing may be needed.

## 2021-10-23 NOTE — Telephone Encounter (Signed)
-----   Message from Willis Modena, RN sent at 10/20/2021  4:47 PM EDT ----- Regarding: Dr. Chryl Heck 1st time Keytruda/A/C f/u tol well Dr. Chryl Heck 1st time Keytruda/Adriamycin/Cytoxan f/u tolerated tx well. Pt call back due.

## 2021-10-27 ENCOUNTER — Inpatient Hospital Stay: Payer: BC Managed Care – PPO

## 2021-10-27 ENCOUNTER — Other Ambulatory Visit: Payer: Self-pay

## 2021-10-27 ENCOUNTER — Other Ambulatory Visit: Payer: Self-pay | Admitting: *Deleted

## 2021-10-27 ENCOUNTER — Inpatient Hospital Stay (HOSPITAL_BASED_OUTPATIENT_CLINIC_OR_DEPARTMENT_OTHER): Payer: BC Managed Care – PPO | Admitting: Physician Assistant

## 2021-10-27 ENCOUNTER — Telehealth: Payer: Self-pay | Admitting: *Deleted

## 2021-10-27 ENCOUNTER — Telehealth: Payer: Self-pay | Admitting: Cardiology

## 2021-10-27 VITALS — BP 105/74 | HR 99 | Temp 98.9°F | Resp 14

## 2021-10-27 DIAGNOSIS — C50411 Malignant neoplasm of upper-outer quadrant of right female breast: Secondary | ICD-10-CM

## 2021-10-27 DIAGNOSIS — T451X5A Adverse effect of antineoplastic and immunosuppressive drugs, initial encounter: Secondary | ICD-10-CM

## 2021-10-27 DIAGNOSIS — R000 Tachycardia, unspecified: Secondary | ICD-10-CM | POA: Diagnosis not present

## 2021-10-27 DIAGNOSIS — Z171 Estrogen receptor negative status [ER-]: Secondary | ICD-10-CM

## 2021-10-27 DIAGNOSIS — R002 Palpitations: Secondary | ICD-10-CM

## 2021-10-27 DIAGNOSIS — D701 Agranulocytosis secondary to cancer chemotherapy: Secondary | ICD-10-CM | POA: Diagnosis not present

## 2021-10-27 LAB — CMP (CANCER CENTER ONLY)
ALT: 28 U/L (ref 0–44)
AST: 17 U/L (ref 15–41)
Albumin: 4.1 g/dL (ref 3.5–5.0)
Alkaline Phosphatase: 72 U/L (ref 38–126)
Anion gap: 6 (ref 5–15)
BUN: 13 mg/dL (ref 6–20)
CO2: 29 mmol/L (ref 22–32)
Calcium: 9.2 mg/dL (ref 8.9–10.3)
Chloride: 101 mmol/L (ref 98–111)
Creatinine: 0.52 mg/dL (ref 0.44–1.00)
GFR, Estimated: >60 mL/min (ref >60)
Glucose, Bld: 88 mg/dL (ref 70–99)
Potassium: 4.4 mmol/L (ref 3.5–5.1)
Sodium: 136 mmol/L (ref 135–145)
Total Bilirubin: 0.7 mg/dL (ref 0.3–1.2)
Total Protein: 6.9 g/dL (ref 6.5–8.1)

## 2021-10-27 LAB — CBC WITH DIFFERENTIAL (CANCER CENTER ONLY)
Abs Immature Granulocytes: 0.04 10*3/uL (ref 0.00–0.07)
Basophils Absolute: 0 10*3/uL (ref 0.0–0.1)
Basophils Relative: 2 %
Eosinophils Absolute: 0.1 10*3/uL (ref 0.0–0.5)
Eosinophils Relative: 4 %
HCT: 37.1 % (ref 36.0–46.0)
Hemoglobin: 12.5 g/dL (ref 12.0–15.0)
Immature Granulocytes: 3 %
Lymphocytes Relative: 50 %
Lymphs Abs: 0.6 10*3/uL — ABNORMAL LOW (ref 0.7–4.0)
MCH: 31.9 pg (ref 26.0–34.0)
MCHC: 33.7 g/dL (ref 30.0–36.0)
MCV: 94.6 fL (ref 80.0–100.0)
Monocytes Absolute: 0.1 10*3/uL (ref 0.1–1.0)
Monocytes Relative: 8 %
Neutro Abs: 0.4 10*3/uL — CL (ref 1.7–7.7)
Neutrophils Relative %: 33 %
Platelet Count: 94 10*3/uL — ABNORMAL LOW (ref 150–400)
RBC: 3.92 MIL/uL (ref 3.87–5.11)
RDW: 11.9 % (ref 11.5–15.5)
WBC Count: 1.2 10*3/uL — ABNORMAL LOW (ref 4.0–10.5)
nRBC: 0 % (ref 0.0–0.2)

## 2021-10-27 MED ORDER — METOPROLOL SUCCINATE ER 25 MG PO TB24: 25.0000 mg | ORAL_TABLET | Freq: Every day | ORAL | 0 refills | Status: DC

## 2021-10-27 NOTE — Progress Notes (Signed)
Symptom Management Consult note [place]    Patient Care Team: Rosine Door as PCP - General (Physician Assistant) Rockwell Germany, RN as Oncology Nurse Navigator Mauro Kaufmann, RN as Oncology Nurse Navigator Jovita Kussmaul, MD as Consulting Physician (General Surgery) Benay Pike, MD as Consulting Physician (Hematology and Oncology) Kyung Rudd, MD as Consulting Physician (Radiation Oncology)    Name of the patient: Wendy Webb  [id number]  03-Jan-1966   Date of visit: 10/27/2021    Chief complaint/ Reason for visit- tachycardia  Oncology History  Malignant neoplasm of upper-outer quadrant of right breast in female, estrogen receptor negative ([place])  09/25/2021 Imaging   Bilateral screening mammogram with irregular hypoechoic mass in the right breast at 12:00 axis, 5 cm from nipple measuring 2.5 cm corresponding to the palpable area of concern. Ultrasound confirmed irregular hypoechoic mass in the right breast at the 12:00 5 cm from the nipple measuring 2.5 cm corresponding to the palpable area of concern.   09/26/2021 Pathology Results   Right breast needle core biopsy showed invasive ductal carcinoma, high-grade prognostic showed ER 0% negative PR 0% negative Ki-67 of 95% and HER2 negative   09/28/2021 Initial Diagnosis   Malignant neoplasm of upper-outer quadrant of right breast in female, estrogen receptor negative ([place])   10/16/2021 Genetic Testing   Negative hereditary cancer genetic testing: no pathogenic variants detected in [facility] CustomNext-Cancer +RNAinsight Panel.  Report date is October 16, 2021.   The CustomNext-Cancer+RNAinsight panel offered by [facility] Forts includes sequencing and rearrangement analysis for the following 47 genes:  APC, ATM, AXIN2, BARD1, BMPR1A, BRCA1, BRCA2, BRIP1, CDH1, CDK4, CDKN2A, CHEK2, DICER1, EPCAM, GREM1, HOXB13, MEN1, MLH1, MSH2, MSH3, MSH6, MUTYH, NBN, NF1, NF2, NTHL1, PALB2, PMS2, POLD1, POLE,  PTEN, RAD51C, RAD51D, RECQL, RET, SDHA, SDHAF2, SDHB, SDHC, SDHD, SMAD4, SMARCA4, STK11, TP53, TSC1, TSC2, and VHL.  RNA data is routinely analyzed for use in variant interpretation for all genes.    10/20/2021 -  Chemotherapy   Patient is on Treatment Plan : BREAST  Pembrolizumab (200) D1 + AC D1 q21d x 4 cycles / Pembrolizumab (200) D1 + Carboplatin (1.5) D1,8,15 + Paclitaxel (80) D1,8,15 q21d X 4 cycles     01/12/2022 - 01/12/2022 Chemotherapy   Patient is on Treatment Plan : BREAST Pembrolizumab (200) D1 + Carboplatin (5) D1 + Paclitaxel (80) D1,8,15 q21d X 4 cycles / Pembrolizumab (200) D1 + AC D1 q21d x 4 cycles       Current Therapy:  Cytoxan, doxorubicin, Keytruda and Udyenca  Last treatment:  chemo  Day  1  Cycle  1  on 10/20/21, G-csf on 10/23/21  Interval history- Wendy Webb is a 56 y.o. with oncologic history as above presenting [facility] today with chief complaint of tachycardia x 3 days.  Patient wears a Fitbit and noticed yesterday that her heart rate had gotten up to 180.  She states during that time she was sweeping the floor.  She did not feel symptomatic and was surprised her heart rate was that high.  When looking back through the data on her phone her heart rate has been elevated over the last 3 days in the 120s to 140s.  Patient also states she was symptomatic at those times.  She does have a history of palpitations.  She states after the birth of her son 28 years ago she was having palpitations and started on metoprolol and only took it for short time.  She states  approximately 2 years ago she again began having palpitations and was restarted on the metoprolol.  She stopped taking it once she felt like the palpitations stopped.  Patient last saw cardiology in 2021.  Patient states after chemo she felt fatigued although thought she tolerated it well overall.  She states she had some muscle pain from the [facility] and took Claritin for 2 days however muscle pain was mild so she has  since discontinued the Claritin. She denies any drug use or alcohol consumption.  She denies fever, chills, chest pain, shortness of breath, syncope, abdominal pain, nausea, vomiting, urinary symptoms, diarrhea.  She denies any recent illness.     ROS  All other systems are reviewed and are negative for acute change except as noted in the HPI.    Allergies  Allergen Reactions   Ace Inhibitors     Cough  Patient states Lisinopril   Shellfish Allergy Nausea And Vomiting     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[place])    09/26/2021 (right breast biopsy)   Mild mitral regurgitation 06/2019   Palpitations    PONV (postoperative nausea and vomiting)      Past Surgical History:  Procedure Laterality Date   CHOLECYSTECTOMY     GALLBLADDER SURGERY     PORTACATH PLACEMENT Left 10/18/2021   Procedure: PLA CEMENT OF PORT;  Surgeon: Jovita Kussmaul, MD;  Location: [facility] OR;  Service: General;  Laterality: Left;    Social History   Socioeconomic History   Marital status: Married    Spouse name: Not on file   Number of children: 2   Years of education: Not on file   Highest education level: Not on file  Occupational History    Comment: Hair dresser  Tobacco Use   Smoking status: Never   Smokeless tobacco: Never  Vaping Use   Vaping Use: Never used  Substance and Sexual Activity   Alcohol use: Yes    Alcohol/week: 0.0 standard drinks of alcohol    Comment: Glass wine per day   Drug use: No   Sexual activity: Yes    Partners: Male  Other Topics Concern   Not on file  Social History Narrative   Pt is a hair stylist   Married (3rd marriage)   2 sons (both grown)   Enjoys yard work, hiking, Scientist, clinical (histocompatibility and immunogenetics)   Complete 2 year cosmetology   Has one Neurosurgeon   Social Determinants of Radio broadcast assistant Strain: Not on Art therapist Insecurity: Not on  file  Transportation Needs: Not on file  Physical Activity: Not on file  Stress: Not on file  Social Connections: Not on file  Intimate Partner Violence: Not on file    Family History  Problem Relation Age of Onset   Hypertension Mother    Heart attack Mother    Heart disease Mother        Atrial fibrillation   Obstructive Sleep Apnea Mother    Hypertension Brother    Stroke Maternal Grandmother 75   Breast cancer Other 30       PGM's sister   Colon cancer Neg Hx    Esophageal cancer Neg Hx      Current Outpatient Medications:    metoprolol succinate (TOPROL XL) 25 MG 24 hr tablet, Take 1 tablet (25 mg total) by mouth  daily., Disp: 30 tablet, Rfl: 0   cholecalciferol (VITAMIN D3) 25 MCG (1000 UNIT) tablet, Take 1,000 Units by mouth daily., Disp: , Rfl:    cyanocobalamin 1000 MCG tablet, Take 1,000 mcg by mouth daily., Disp: , Rfl:    dexamethasone (DECADRON) 4 MG tablet, Take 2 tablets once a day for 3 days after carboplatin and AC chemotherapy. Take with food., Disp: 30 tablet, Rfl: 1   lidocaine-prilocaine (EMLA) cream, Apply to affected area once, Disp: 30 g, Rfl: 3   LORazepam (ATIVAN) 0.5 MG tablet, Take 0.25 mg by mouth every morning., Disp: , Rfl:    Misc Natural Products (TART CHERRY ADVANCED PO), Take 1 tablet by mouth daily., Disp: , Rfl:    Multiple Vitamins-Minerals (MULTIVIT/MULTIMINERAL ADULT) LIQD, Take 30 mLs by mouth daily., Disp: , Rfl:    ondansetron (ZOFRAN) 8 MG tablet, Take 1 tablet (8 mg total) by mouth 2 (two) times daily as needed. Start on the third day after carboplatin and AC chemotherapy., Disp: 30 tablet, Rfl: 1   oxyCODONE (ROXICODONE) 5 MG immediate release tablet, Take 1 tablet (5 mg total) by mouth every 6 (six) hours as needed for severe pain., Disp: 5 tablet, Rfl: 0   prochlorperazine (COMPAZINE) 10 MG tablet, Take 1 tablet (10 mg total) by mouth every 6 (six) hours as needed (Nausea or vomiting)., Disp: 30 tablet, Rfl: 1  PHYSICAL EXAM: ECOG  FS:1 - Symptomatic but completely ambulatory    Vitals:   10/27/21 1304  BP: 105/74  Pulse: 99  Resp: 14  Temp: 98.9 F (37.2 C)  TempSrc: Oral  SpO2: 100%   Physical Exam Vitals and nursing note reviewed.  Constitutional:      Appearance: She is well-developed. She is not ill-appearing or toxic-appearing.  HENT:     Head: Normocephalic and atraumatic.     Nose: Nose normal.  Eyes:     General: No scleral icterus.       Right eye: No discharge.        Left eye: No discharge.     Conjunctiva/sclera: Conjunctivae normal.  Neck:     Vascular: No JVD.  Cardiovascular:     Rate and Rhythm: Normal rate and regular rhythm.     Pulses: Normal pulses.          Radial pulses are 2+ on the right side and 2+ on the left side.     Heart sounds: Normal heart sounds. No murmur heard. Pulmonary:     Effort: Pulmonary effort is normal.     Breath sounds: Normal breath sounds.  Abdominal:     General: There is no distension.  Musculoskeletal:        General: Normal range of motion.     Cervical back: Normal range of motion.     Right lower leg: No edema.     Left lower leg: No edema.  Skin:    General: Skin is warm and dry.  Neurological:     Mental Status: She is oriented to person, place, and time.     GCS: GCS eye subscore is 4. GCS verbal subscore is 5. GCS motor subscore is 6.     Comments: Fluent speech, no facial droop.  Psychiatric:        Behavior: Behavior normal.        LABORATORY DATA: I have reviewed the data as listed    Latest Ref Rng & Units 10/27/2021   12:33 PM 10/20/2021   11:45 AM 10/04/2021  8:24 AM  CBC  WBC 4.0 - 10.5 K/uL 1.2  7.6  5.9   Hemoglobin 12.0 - 15.0 g/dL 12.5  12.3  12.7   Hematocrit 36.0 - 46.0 % 37.1  36.0  37.4   Platelets 150 - 400 K/uL 94  246  255         Latest Ref Rng & Units 10/27/2021   12:33 PM 10/20/2021   11:45 AM 10/04/2021    8:24 AM  CMP  Glucose 70 - 99 mg/dL 88  120  86   BUN 6 - 20 mg/dL 13  12  15     Creatinine 0.44 - 1.00 mg/dL 0.52  0.61  0.69   Sodium 135 - 145 mmol/L 136  139  140   Potassium 3.5 - 5.1 mmol/L 4.4  3.8  4.6   Chloride 98 - 111 mmol/L 101  105  105   CO2 22 - 32 mmol/L 29  29  30    Calcium 8.9 - 10.3 mg/dL 9.2  9.2  9.5   Total Protein 6.5 - 8.1 g/dL 6.9  7.1  7.4   Total Bilirubin 0.3 - 1.2 mg/dL 0.7  0.5  0.6   Alkaline Phos 38 - 126 U/L 72  52  56   AST 15 - 41 U/L 17  19  17    ALT 0 - 44 U/L 28  23  16         RADIOGRAPHIC STUDIES (from last 24 hours if applicable) I have personally reviewed the radiological images as listed and agreed with the findings in the report. No results found.     ASSESSMENT & PLAN: Patient is a 56 y.o. female  with oncologic history of malignant neoplasm of upper outer quadrant of right breast, ER negative followed by Dr. Chryl Heck.  I have viewed most recent oncology note and lab work.   #) Tachycardia- Patient is well-appearing.  Heart rate is ranging from 99-105 here in clinic.  She is asymptomatic.  Overall has a benign exam.   EKG shows sinus rhythm.  Heart rate in clinic ranging from 99-105.  Discussed with covering oncologist Dr. Lindi Adie who agrees with plan to start patient on metoprolol 25 mg daily.  This could be a side effect of her chemo and will need to be monitored throughout her treatments.  Patient's blood pressure today is 105/74 which she reports is low for her.  Patient does check her blood pressure daily and she was advised to hold metoprolol if systolic is <08. Strict ED precautions discussed should symptoms worsen.   #)Neutropenia- CBC today shows WBC of 1.2 with ANC of 0.4.  Patient also has low platelet count at 94.  Likely related to chemotherapy on 7/14.  She did receive a Udenyca on 7/17.  She has no infectious symptoms.  Discussed neutropenic precautions with patient.  Suspect ANC and WBC to rise the coming days as this is a long-acting growth factor.  #) malignant neoplasm of upper outer quadrant of right  breast, ER negative- Next appointment with oncologist ism 11/10/21.   Visit Diagnosis: 1. Tachycardia   2. Malignant neoplasm of upper-outer quadrant of right breast in female, estrogen receptor negative ([place])   3. Chemotherapy-induced neutropenia ([place])      Orders Placed This Encounter  Procedures   EKG 12-Lead    All questions were answered. The patient knows to call the clinic with any problems, questions or concerns. No barriers to learning was detected.  I have spent a  total of 30 minutes minutes of face-to-face and non-face-to-face time, preparing to see the patient, obtaining and/or reviewing separately obtained history, performing a medically appropriate examination, counseling and educating the patient, ordering tests, documenting clinical information in the electronic health record, and care coordination (communications with other health care professionals or caregivers).    Thank you for allowing me to participate in the care of this patient.    Barrie Folk, PA-C Department of Hematology/Oncology [facility] at [facility] Phone: [phone]  [phone]    10/27/2021 4:52 PM

## 2021-10-27 NOTE — Telephone Encounter (Signed)
This RN returned call to pt who stated issue on onset of increased heart rate this week from her normal of 60-70 to 60 -110.  Presently it is 108.  She notes one episode yesterday post activity (sweeping the kitchen floor) of rate going to 180.  She denies any SOB or chest discomfort.  She wears an Apple watch and has noted the changes primarily by monitoring.  Pt is now d8 of C1 of AC with pegfilgrastim support.  She has a port with reading of : Left chest Port-A-Cath with tip near the superior cavoatrial Junction.  Of note per chart review noted pt has history of palpitations and mild mitral valve regurgitation - has been on beta blocker (metoprolol) remotely and then due to episode of increased B/P was on lisinopril with ability to d/c.  Noted in chart pt called Dr Jacalyn Lefevre per above and was told episodes likely secondary to chemo and pegfilgrastrim.

## 2021-10-27 NOTE — Telephone Encounter (Signed)
Patient reports that since her chemo and neulasta injection this past Monday, her heart will race at times. Her resting pulse is usually in the 60s. While mopping, it went up to 180s. She denies palpitations and her Apple watch didn't show afib. She denies sob with the rapid pulse. She is asking if the chemo and neulasta could be causing this.

## 2021-10-27 NOTE — Progress Notes (Signed)
CRITICAL VALUE STICKER  CRITICAL VALUE: ANC 0.4  RECEIVER (on-site recipient of call): Thyra Breed  DATE & TIME NOTIFIED: 10/27/21, 1320  MESSENGER (representative from lab): Orvis Brill  MD NOTIFIED: Sherol Dade, PA, made aware.   TIME OF NOTIFICATION: 1320  RESPONSE:

## 2021-10-27 NOTE — Telephone Encounter (Signed)
STAT if HR is under 50 or over 120 (normal HR is 60-100 beats per minute)  What is your heart rate? 105 - sitting  Do you have a log of your heart rate readings (document readings)?  181 - yesterday  Do you have any other symptoms? No    Pt states that she had an injection on Monday for her Chemo and thinks that they may have something to do with it. Please advise

## 2021-10-27 NOTE — Telephone Encounter (Signed)
Patient informed of Dr. Jacalyn Lefevre reply to her medication question regarding heart rate.Wendy KitchenMarland Webb"Yes. If symptoms persist we can arrange monitor to further assess." Explained to patient that the chemo and neulasta can increase heart rate. If her symptoms persist, a heart monitor would be ordered to assess her pulse and rhythm. She agreed.

## 2021-10-30 ENCOUNTER — Other Ambulatory Visit: Payer: Self-pay

## 2021-11-02 ENCOUNTER — Inpatient Hospital Stay: Payer: BC Managed Care – PPO

## 2021-11-02 ENCOUNTER — Ambulatory Visit: Payer: BC Managed Care – PPO | Admitting: Adult Health

## 2021-11-03 ENCOUNTER — Ambulatory Visit: Payer: BC Managed Care – PPO

## 2021-11-03 ENCOUNTER — Inpatient Hospital Stay: Payer: BC Managed Care – PPO

## 2021-11-04 ENCOUNTER — Ambulatory Visit: Payer: BC Managed Care – PPO

## 2021-11-06 ENCOUNTER — Ambulatory Visit: Payer: BC Managed Care – PPO

## 2021-11-09 MED FILL — Dexamethasone Sodium Phosphate Inj 100 MG/10ML: INTRAMUSCULAR | Qty: 1 | Status: AC

## 2021-11-09 MED FILL — Fosaprepitant Dimeglumine For IV Infusion 150 MG (Base Eq): INTRAVENOUS | Qty: 5 | Status: AC

## 2021-11-10 ENCOUNTER — Encounter: Payer: Self-pay | Admitting: *Deleted

## 2021-11-10 ENCOUNTER — Inpatient Hospital Stay: Payer: BC Managed Care – PPO | Attending: Hematology and Oncology

## 2021-11-10 ENCOUNTER — Inpatient Hospital Stay: Payer: BC Managed Care – PPO | Admitting: Physician Assistant

## 2021-11-10 ENCOUNTER — Other Ambulatory Visit: Payer: Self-pay

## 2021-11-10 ENCOUNTER — Inpatient Hospital Stay: Payer: BC Managed Care – PPO

## 2021-11-10 ENCOUNTER — Other Ambulatory Visit: Payer: Self-pay | Admitting: *Deleted

## 2021-11-10 ENCOUNTER — Inpatient Hospital Stay (HOSPITAL_BASED_OUTPATIENT_CLINIC_OR_DEPARTMENT_OTHER): Payer: BC Managed Care – PPO | Admitting: Physician Assistant

## 2021-11-10 VITALS — BP 122/72 | HR 65 | Temp 97.6°F | Resp 16 | Ht 67.0 in | Wt 147.3 lb

## 2021-11-10 DIAGNOSIS — Z5189 Encounter for other specified aftercare: Secondary | ICD-10-CM | POA: Insufficient documentation

## 2021-11-10 DIAGNOSIS — L659 Nonscarring hair loss, unspecified: Secondary | ICD-10-CM | POA: Diagnosis not present

## 2021-11-10 DIAGNOSIS — Z79899 Other long term (current) drug therapy: Secondary | ICD-10-CM | POA: Diagnosis not present

## 2021-11-10 DIAGNOSIS — C50411 Malignant neoplasm of upper-outer quadrant of right female breast: Secondary | ICD-10-CM

## 2021-11-10 DIAGNOSIS — Z171 Estrogen receptor negative status [ER-]: Secondary | ICD-10-CM

## 2021-11-10 DIAGNOSIS — R000 Tachycardia, unspecified: Secondary | ICD-10-CM | POA: Insufficient documentation

## 2021-11-10 DIAGNOSIS — Z803 Family history of malignant neoplasm of breast: Secondary | ICD-10-CM | POA: Insufficient documentation

## 2021-11-10 DIAGNOSIS — Z5112 Encounter for antineoplastic immunotherapy: Secondary | ICD-10-CM | POA: Diagnosis not present

## 2021-11-10 DIAGNOSIS — R002 Palpitations: Secondary | ICD-10-CM | POA: Diagnosis not present

## 2021-11-10 DIAGNOSIS — K589 Irritable bowel syndrome without diarrhea: Secondary | ICD-10-CM | POA: Insufficient documentation

## 2021-11-10 DIAGNOSIS — I10 Essential (primary) hypertension: Secondary | ICD-10-CM | POA: Insufficient documentation

## 2021-11-10 DIAGNOSIS — Z7952 Long term (current) use of systemic steroids: Secondary | ICD-10-CM | POA: Diagnosis not present

## 2021-11-10 DIAGNOSIS — T451X5A Adverse effect of antineoplastic and immunosuppressive drugs, initial encounter: Secondary | ICD-10-CM | POA: Insufficient documentation

## 2021-11-10 DIAGNOSIS — I340 Nonrheumatic mitral (valve) insufficiency: Secondary | ICD-10-CM | POA: Insufficient documentation

## 2021-11-10 DIAGNOSIS — Z95828 Presence of other vascular implants and grafts: Secondary | ICD-10-CM

## 2021-11-10 DIAGNOSIS — Z5111 Encounter for antineoplastic chemotherapy: Secondary | ICD-10-CM | POA: Insufficient documentation

## 2021-11-10 DIAGNOSIS — R102 Pelvic and perineal pain: Secondary | ICD-10-CM | POA: Diagnosis not present

## 2021-11-10 LAB — CMP (CANCER CENTER ONLY)
ALT: 32 U/L (ref 0–44)
AST: 21 U/L (ref 15–41)
Albumin: 4.3 g/dL (ref 3.5–5.0)
Alkaline Phosphatase: 71 U/L (ref 38–126)
Anion gap: 5 (ref 5–15)
BUN: 14 mg/dL (ref 6–20)
CO2: 28 mmol/L (ref 22–32)
Calcium: 9 mg/dL (ref 8.9–10.3)
Chloride: 105 mmol/L (ref 98–111)
Creatinine: 0.56 mg/dL (ref 0.44–1.00)
GFR, Estimated: >60 mL/min (ref >60)
Glucose, Bld: 133 mg/dL — ABNORMAL HIGH (ref 70–99)
Potassium: 3.9 mmol/L (ref 3.5–5.1)
Sodium: 138 mmol/L (ref 135–145)
Total Bilirubin: 0.3 mg/dL (ref 0.3–1.2)
Total Protein: 7.1 g/dL (ref 6.5–8.1)

## 2021-11-10 LAB — CBC WITH DIFFERENTIAL (CANCER CENTER ONLY)
Abs Immature Granulocytes: 0.03 K/uL (ref 0.00–0.07)
Basophils Absolute: 0.1 K/uL (ref 0.0–0.1)
Basophils Relative: 1 %
Eosinophils Absolute: 0 K/uL (ref 0.0–0.5)
Eosinophils Relative: 0 %
HCT: 33.8 % — ABNORMAL LOW (ref 36.0–46.0)
Hemoglobin: 11.7 g/dL — ABNORMAL LOW (ref 12.0–15.0)
Immature Granulocytes: 1 %
Lymphocytes Relative: 19 %
Lymphs Abs: 1.1 K/uL (ref 0.7–4.0)
MCH: 32.3 pg (ref 26.0–34.0)
MCHC: 34.6 g/dL (ref 30.0–36.0)
MCV: 93.4 fL (ref 80.0–100.0)
Monocytes Absolute: 0.7 K/uL (ref 0.1–1.0)
Monocytes Relative: 12 %
Neutro Abs: 3.9 K/uL (ref 1.7–7.7)
Neutrophils Relative %: 67 %
Platelet Count: 250 K/uL (ref 150–400)
RBC: 3.62 MIL/uL — ABNORMAL LOW (ref 3.87–5.11)
RDW: 12.4 % (ref 11.5–15.5)
WBC Count: 5.7 K/uL (ref 4.0–10.5)
nRBC: 0 % (ref 0.0–0.2)

## 2021-11-10 MED ORDER — DOXORUBICIN HCL CHEMO IV INJECTION 2 MG/ML
60.0000 mg/m2 | Freq: Once | INTRAVENOUS | Status: AC
  Administered 2021-11-10: 108 mg via INTRAVENOUS
  Filled 2021-11-10: qty 54

## 2021-11-10 MED ORDER — SODIUM CHLORIDE 0.9 % IV SOLN: Freq: Once | INTRAVENOUS | Status: AC

## 2021-11-10 MED ORDER — SODIUM CHLORIDE 0.9% FLUSH
10.0000 mL | Freq: Once | INTRAVENOUS | Status: AC
  Administered 2021-11-10: 10 mL

## 2021-11-10 MED ORDER — SODIUM CHLORIDE 0.9 % IV SOLN
200.0000 mg | Freq: Once | INTRAVENOUS | Status: AC
  Administered 2021-11-10: 200 mg via INTRAVENOUS
  Filled 2021-11-10: qty 200

## 2021-11-10 MED ORDER — PALONOSETRON HCL INJECTION 0.25 MG/5ML
0.2500 mg | Freq: Once | INTRAVENOUS | Status: AC
  Administered 2021-11-10: 0.25 mg via INTRAVENOUS
  Filled 2021-11-10: qty 5

## 2021-11-10 MED ORDER — HEPARIN SOD (PORK) LOCK FLUSH 100 UNIT/ML IV SOLN
500.0000 [IU] | Freq: Once | INTRAVENOUS | Status: AC | PRN
  Administered 2021-11-10: 500 [IU]

## 2021-11-10 MED ORDER — SODIUM CHLORIDE 0.9 % IV SOLN
10.0000 mg | Freq: Once | INTRAVENOUS | Status: AC
  Administered 2021-11-10: 10 mg via INTRAVENOUS
  Filled 2021-11-10: qty 10

## 2021-11-10 MED ORDER — SODIUM CHLORIDE 0.9 % IV SOLN
600.0000 mg/m2 | Freq: Once | INTRAVENOUS | Status: AC
  Administered 2021-11-10: 1080 mg via INTRAVENOUS
  Filled 2021-11-10: qty 54

## 2021-11-10 MED ORDER — SODIUM CHLORIDE 0.9% FLUSH
10.0000 mL | INTRAVENOUS | Status: DC | PRN
  Administered 2021-11-10: 10 mL

## 2021-11-10 MED ORDER — SODIUM CHLORIDE 0.9 % IV SOLN
150.0000 mg | Freq: Once | INTRAVENOUS | Status: AC
  Administered 2021-11-10: 150 mg via INTRAVENOUS
  Filled 2021-11-10: qty 150

## 2021-11-10 NOTE — Patient Instructions (Signed)
[facility] MEDICAL ONCOLOGY  Discharge Instructions: Thank you for choosing [facility] to provide your oncology and hematology care.   If you have a lab appointment with the Cancer Center, please go directly to the Cancer Center and check in at the registration area.   Wear comfortable clothing and clothing appropriate for easy access to any Portacath or PICC line.   We strive to give you quality time with your provider. You may need to reschedule your appointment if you arrive late (15 or more minutes).  Arriving late affects you and other patients whose appointments are after yours.  Also, if you miss three or more appointments without notifying the office, you may be dismissed from the clinic at the provider's discretion.      For prescription refill requests, have your pharmacy contact our office and allow 72 hours for refills to be completed.    Today you received the following chemotherapy and/or immunotherapy agents: pembrolizumab, doxorubicin, cyclophosphamide      To help prevent nausea and vomiting after your treatment, we encourage you to take your nausea medication as directed.  BELOW ARE SYMPTOMS THAT SHOULD BE REPORTED IMMEDIATELY: *FEVER GREATER THAN 100.4 F (38 C) OR HIGHER *CHILLS OR SWEATING *NAUSEA AND VOMITING THAT IS NOT CONTROLLED WITH YOUR NAUSEA MEDICATION *UNUSUAL SHORTNESS OF BREATH *UNUSUAL BRUISING OR BLEEDING *URINARY PROBLEMS (pain or burning when urinating, or frequent urination) *BOWEL PROBLEMS (unusual diarrhea, constipation, pain near the anus) TENDERNESS IN MOUTH AND THROAT WITH OR WITHOUT PRESENCE OF ULCERS (sore throat, sores in mouth, or a toothache) UNUSUAL RASH, SWELLING OR PAIN  UNUSUAL VAGINAL DISCHARGE OR ITCHING   Items with * indicate a potential emergency and should be followed up as soon as possible or go to the Emergency Department if any problems should occur.  Please show the CHEMOTHERAPY ALERT CARD or  IMMUNOTHERAPY ALERT CARD at check-in to the Emergency Department and triage nurse.  Should you have questions after your visit or need to cancel or reschedule your appointment, please contact [facility] MEDICAL ONCOLOGY  Dept: [phone]  and follow the prompts.  Office hours are 8:00 a.m. to 4:30 p.m. Monday - Friday. Please note that voicemails left after 4:00 p.m. may not be returned until the following business day.  We are closed weekends and major holidays. You have access to a nurse at all times for urgent questions. Please call the main number to the clinic Dept: [phone] and follow the prompts.   For any non-urgent questions, you may also contact your provider using MyChart. We now offer e-Visits for anyone 18 and older to request care online for non-urgent symptoms. For details visit [email or web address].   Also download the MyChart app! Go to the app store, search "MyChart", open the app, select [facility], and log in with your MyChart username and password.  Masks are optional in the cancer centers. If you would like for your care team to wear a mask while they are taking care of you, please let them know. You may have one support person who is at least 55 years old accompany you for your appointments. 

## 2021-11-10 NOTE — Progress Notes (Addendum)
[facility] PROGRESS NOTE  Patient Care Team: Rosine Door as PCP - General (Physician Assistant) Rockwell Germany, RN as Oncology Nurse Navigator Mauro Kaufmann, RN as Oncology Nurse Navigator Jovita Kussmaul, MD as Consulting Physician (General Surgery) Benay Pike, MD as Consulting Physician (Hematology and Oncology) Kyung Rudd, MD as Consulting Physician (Radiation Oncology)  CHIEF COMPLAINTS:  Breast cancer  SUMMARY OF ONCOLOGIC HISTORY: Oncology History  Malignant neoplasm of upper-outer quadrant of right breast in female, estrogen receptor negative ([place])  09/25/2021 Imaging   Bilateral screening mammogram with irregular hypoechoic mass in the right breast at 12:00 axis, 5 cm from nipple measuring 2.5 cm corresponding to the palpable area of concern. Ultrasound confirmed irregular hypoechoic mass in the right breast at the 12:00 5 cm from the nipple measuring 2.5 cm corresponding to the palpable area of concern.   09/26/2021 Pathology Results   Right breast needle core biopsy showed invasive ductal carcinoma, high-grade prognostic showed ER 0% negative PR 0% negative Ki-67 of 95% and HER2 negative   09/28/2021 Initial Diagnosis   Malignant neoplasm of upper-outer quadrant of right breast in female, estrogen receptor negative ([place])   10/16/2021 Genetic Testing   Negative hereditary cancer genetic testing: no pathogenic variants detected in [facility] CustomNext-Cancer +RNAinsight Panel.  Report date is October 16, 2021.   The CustomNext-Cancer+RNAinsight panel offered by [facility] includes sequencing and rearrangement analysis for the following 47 genes:  APC, ATM, AXIN2, BARD1, BMPR1A, BRCA1, BRCA2, BRIP1, CDH1, CDK4, CDKN2A, CHEK2, DICER1, EPCAM, GREM1, HOXB13, MEN1, MLH1, MSH2, MSH3, MSH6, MUTYH, NBN, NF1, NF2, NTHL1, PALB2, PMS2, POLD1, POLE, PTEN, RAD51C, RAD51D, RECQL, RET, SDHA, SDHAF2, SDHB, SDHC, SDHD, SMAD4, SMARCA4, STK11, TP53, TSC1, TSC2, and  VHL.  RNA data is routinely analyzed for use in variant interpretation for all genes.    10/20/2021 -  Chemotherapy   Patient is on Treatment Plan : BREAST  Pembrolizumab (200) D1 + AC D1 q21d x 4 cycles / Pembrolizumab (200) D1 + Carboplatin (1.5) D1,8,15 + Paclitaxel (80) D1,8,15 q21d X 4 cycles     01/12/2022 - 01/12/2022 Chemotherapy   Patient is on Treatment Plan : BREAST Pembrolizumab (200) D1 + Carboplatin (5) D1 + Paclitaxel (80) D1,8,15 q21d X 4 cycles / Pembrolizumab (200) D1 + AC D1 q21d x 4 cycles      INTERVAL HISTORY: Demyah Smyre returns for follow-up before cycle 2 of neoadjuvant treatment with Keytruda, Adriamycin and Cytoxan.  She is accompanied by her husband for this visit.  Ms. Eskin reports that she felt fatigued for 2 to 3 days following the G-CSF injection.  She tries to remain active and walks every day.  She has a good appetite and denies any weight loss.  Patient denies any nausea vomiting episodes.  Patient has noticed intermittent episodes of mild pelvic cramping, similar to when she had a menstrual cycle.  She denies any triggers that cause the cramping and does not require any pain medication.  She experienced some constipation after the first cycle which has resolved taking MiraLAX.  Patient denies any urinary symptoms.  She experienced mild tingling in her toes after the first treatment which has resolved.  She reports sensitivity to one of her upper teeth and some sensitivity to the cold.  She denies any interference with p.o. intake.  Since starting on metoprolol, her heart rate is back to baseline in the 60s to 70s.  She denies any fevers, chills, night sweats, shortness of breath, chest pain  or cough.  She has no other complaints.  Rest of the pertinent 10 point ROS reviewed and negative.  MEDICAL HISTORY:  Past Medical History:  Diagnosis Date   Family history of breast cancer 10/04/2021   Hypertension    IBS (irritable bowel syndrome)    Invasive ductal  carcinoma of breast ([place])    09/26/2021 (right breast biopsy)   Mild mitral regurgitation 06/2019   Palpitations    PONV (postoperative nausea and vomiting)     SURGICAL HISTORY: Past Surgical History:  Procedure Laterality Date   CHOLECYSTECTOMY     GALLBLADDER SURGERY     PORTACATH PLACEMENT Left 10/18/2021   Procedure: PLA CEMENT OF PORT;  Surgeon: Jovita Kussmaul, MD;  Location: [facility] OR;  Service: General;  Laterality: Left;    SOCIAL HISTORY: Social History   Socioeconomic History   Marital status: Married    Spouse name: Not on file   Number of children: 2   Years of education: Not on file   Highest education level: Not on file  Occupational History    Comment: Hair dresser  Tobacco Use   Smoking status: Never   Smokeless tobacco: Never  Vaping Use   Vaping Use: Never used  Substance and Sexual Activity   Alcohol use: Yes    Alcohol/week: 0.0 standard drinks of alcohol    Comment: Glass wine per day   Drug use: No   Sexual activity: Yes    Partners: Male  Other Topics Concern   Not on file  Social History Narrative   Pt is a hair stylist   Married (3rd marriage)   2 sons (both grown)   Enjoys yard work, hiking, Scientist, clinical (histocompatibility and immunogenetics)   Complete 2 year cosmetology   Has one Neurosurgeon   Social Determinants of Radio broadcast assistant Strain: Not on Art therapist Insecurity: Not on file  Transportation Needs: Not on file  Physical Activity: Not on file  Stress: Not on file  Social Connections: Not on file  Intimate Partner Violence: Not on file    FAMILY HISTORY: Family History  Problem Relation Age of Onset   Hypertension Mother    Heart attack Mother    Heart disease Mother        Atrial fibrillation   Obstructive Sleep Apnea Mother    Hypertension Brother    Stroke Maternal Grandmother 75   Breast cancer Other 82       PGM's sister   Colon cancer Neg Hx    Esophageal cancer Neg Hx     ALLERGIES:  is allergic to ace inhibitors and shellfish  allergy.  MEDICATIONS:  Current Outpatient Medications  Medication Sig Dispense Refill   cholecalciferol (VITAMIN D3) 25 MCG (1000 UNIT) tablet Take 1,000 Units by mouth daily.     cyanocobalamin 1000 MCG tablet Take 1,000 mcg by mouth daily.     dexamethasone (DECADRON) 4 MG tablet Take 2 tablets once a day for 3 days after carboplatin and AC chemotherapy. Take with food. 30 tablet 1   lidocaine-prilocaine (EMLA) cream Apply to affected area once 30 g 3   LORazepam (ATIVAN) 0.5 MG tablet Take 0.25 mg by mouth every morning.     metoprolol succinate (TOPROL XL) 25 MG 24 hr tablet Take 1 tablet (25 mg total) by mouth daily. 30 tablet 0   Misc Natural Products (TART CHERRY ADVANCED PO) Take 1 tablet by mouth daily.     Multiple Vitamins-Minerals (MULTIVIT/MULTIMINERAL ADULT) LIQD Take 30 mLs by  mouth daily.     ondansetron (ZOFRAN) 8 MG tablet Take 1 tablet (8 mg total) by mouth 2 (two) times daily as needed. Start on the third day after carboplatin and AC chemotherapy. 30 tablet 1   oxyCODONE (ROXICODONE) 5 MG immediate release tablet Take 1 tablet (5 mg total) by mouth every 6 (six) hours as needed for severe pain. 5 tablet 0   prochlorperazine (COMPAZINE) 10 MG tablet Take 1 tablet (10 mg total) by mouth every 6 (six) hours as needed (Nausea or vomiting). 30 tablet 1   No current facility-administered medications for this visit.   Facility-Administered Medications Ordered in Other Visits  Medication Dose Route Frequency Provider Last Rate Last Admin   0.9 %  sodium chloride infusion   Intravenous Once Iruku, Praveena, MD       cyclophosphamide (CYTOXAN) 1,080 mg in sodium chloride 0.9 % 250 mL chemo infusion  600 mg/m2 (Treatment Plan Recorded) Intravenous Once Iruku, Arletha Pili, MD       dexamethasone (DECADRON) 10 mg in sodium chloride 0.9 % 50 mL IVPB  10 mg Intravenous Once Iruku, Arletha Pili, MD       DOXOrubicin (ADRIAMYCIN) chemo injection 108 mg  60 mg/m2 (Treatment Plan Recorded)  Intravenous Once Iruku, Arletha Pili, MD       fosaprepitant (EMEND) 150 mg in sodium chloride 0.9 % 145 mL IVPB  150 mg Intravenous Once Iruku, Praveena, MD       heparin lock flush 100 unit/mL  500 Units Intracatheter Once PRN Iruku, Arletha Pili, MD       palonosetron (ALOXI) injection 0.25 mg  0.25 mg Intravenous Once Iruku, Arletha Pili, MD       pembrolizumab (KEYTRUDA) 200 mg in sodium chloride 0.9 % 50 mL chemo infusion  200 mg Intravenous Once Iruku, Praveena, MD       sodium chloride flush (NS) 0.9 % injection 10 mL  10 mL Intracatheter PRN Iruku, Praveena, MD        REVIEW OF SYSTEMS:   Constitutional: Denies fevers, chills or abnormal night sweats Eyes: Denies blurriness of vision, double vision or watery eyes Ears, nose, mouth, throat, and face: Denies mucositis or sore throat Respiratory: Denies cough, dyspnea or wheezes Cardiovascular: Denies palpitation, chest discomfort or lower extremity swelling Gastrointestinal:  Denies nausea, heartburn or change in bowel habits Skin: Denies abnormal skin rashes Lymphatics: Denies new lymphadenopathy or easy bruising Neurological:Denies numbness, tingling or new weaknesses Behavioral/Psych: Mood is stable, no new changes  Breast: Denies any palpable lumps or discharge All other systems were reviewed with the patient and are negative.  PHYSICAL EXAMINATION: ECOG PERFORMANCE STATUS: 0 - Asymptomatic  Vitals:   11/10/21 1030  BP: 122/72  Pulse: 65  Resp: 16  Temp: 97.6 F (36.4 C)  SpO2: 100%   Filed Weights   11/10/21 1030  Weight: 147 lb 4.8 oz (66.8 kg)    GENERAL:alert, no distress and comfortable SKIN: skin color, texture, turgor are normal, no rashes or significant lesions EYES: normal, conjunctiva are pink and non-injected, sclera clear OROPHARYNX:no exudate, no erythema and lips, buccal mucosa, and tongue normal  NECK: supple, thyroid normal size, non-tender, without nodularity LYMPH:  no palpable lymphadenopathy in the  cervical or inguinal LUNGS: clear to auscultation and percussion with normal breathing effort HEART: regular rate & rhythm and no murmurs and no lower extremity edema Musculoskeletal:no cyanosis of digits and no clubbing  PSYCH: alert & oriented x 3 with fluent speech NEURO: no focal motor/sensory deficits BREAST: Breast exam deferred today  LABORATORY DATA:  I have reviewed the data as listed Lab Results  Component Value Date   WBC 5.7 11/10/2021   HGB 11.7 (L) 11/10/2021   HCT 33.8 (L) 11/10/2021   MCV 93.4 11/10/2021   PLT 250 11/10/2021   Lab Results  Component Value Date   NA 138 11/10/2021   K 3.9 11/10/2021   CL 105 11/10/2021   CO2 28 11/10/2021    RADIOGRAPHIC STUDIES: I have personally reviewed the radiological reports and agreed with the findings in the report.  ASSESSMENT AND PLAN:  Wendy Webb is a 56 y.o. 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  She receives G-CSF injection on day 3 of each cycle. -- Patient presents today for cycle 2, day 1 of chemoimmunotherapy.  Labs from today were reviewed and adequate for today.  WBC 5.7, hemoglobin 11.7, platelets 250 K and ANC 3.9.  Okay to proceed with treatment as scheduled without any dose modifications. --RTC on 11/30/2021 for labs, follow up visit with Dr. Chryl Heck before Cycle 3, Day 1.  #Palpitations/Tachycardia: --Started metoprolol 25 mg daily on 10/27/2021 with HR back to baseline in the 60-70's.  --Recommend to hold if hold metoprolol if systolic BP is <47.   #Pelvic cramps: --Mild and no clear etiology --Monitor for now and advised to follow up in clinic if symptoms worsen.   Patient and  her husband expressed understanding and satisfaction with the plan provided.  I have spent a total of 30 minutes minutes of face-to-face and non-face-to-face time, preparing to see the patient,  performing a medically appropriate examination, counseling and educating the patient, documenting clinical information in the electronic health record,and care coordination.    Dede Query PA-C Dept of Hematology and [place] at [facility] Phone: [phone]

## 2021-11-13 ENCOUNTER — Inpatient Hospital Stay: Payer: BC Managed Care – PPO

## 2021-11-13 ENCOUNTER — Other Ambulatory Visit: Payer: Self-pay

## 2021-11-13 VITALS — BP 102/58 | HR 67 | Temp 99.1°F | Resp 18

## 2021-11-13 DIAGNOSIS — C50411 Malignant neoplasm of upper-outer quadrant of right female breast: Secondary | ICD-10-CM

## 2021-11-13 DIAGNOSIS — Z171 Estrogen receptor negative status [ER-]: Secondary | ICD-10-CM

## 2021-11-13 MED ORDER — PEGFILGRASTIM-CBQV 6 MG/0.6ML ~~LOC~~ SOSY
6.0000 mg | PREFILLED_SYRINGE | Freq: Once | SUBCUTANEOUS | Status: AC
  Administered 2021-11-13: 6 mg via SUBCUTANEOUS
  Filled 2021-11-13: qty 0.6

## 2021-11-14 ENCOUNTER — Ambulatory Visit: Payer: BC Managed Care – PPO

## 2021-11-15 ENCOUNTER — Ambulatory Visit: Payer: BC Managed Care – PPO

## 2021-11-17 ENCOUNTER — Inpatient Hospital Stay: Payer: BC Managed Care – PPO

## 2021-11-17 ENCOUNTER — Ambulatory Visit: Payer: BC Managed Care – PPO | Admitting: Hematology and Oncology

## 2021-11-23 ENCOUNTER — Inpatient Hospital Stay: Payer: BC Managed Care – PPO

## 2021-11-24 ENCOUNTER — Inpatient Hospital Stay: Payer: BC Managed Care – PPO

## 2021-11-24 ENCOUNTER — Ambulatory Visit: Payer: BC Managed Care – PPO

## 2021-11-25 ENCOUNTER — Ambulatory Visit: Payer: BC Managed Care – PPO

## 2021-11-27 ENCOUNTER — Ambulatory Visit: Payer: BC Managed Care – PPO

## 2021-11-30 ENCOUNTER — Other Ambulatory Visit: Payer: Self-pay

## 2021-11-30 ENCOUNTER — Inpatient Hospital Stay (HOSPITAL_BASED_OUTPATIENT_CLINIC_OR_DEPARTMENT_OTHER): Payer: BC Managed Care – PPO | Admitting: Hematology and Oncology

## 2021-11-30 ENCOUNTER — Inpatient Hospital Stay: Payer: BC Managed Care – PPO

## 2021-11-30 ENCOUNTER — Encounter: Payer: Self-pay | Admitting: Hematology and Oncology

## 2021-11-30 VITALS — BP 128/73 | HR 72 | Temp 97.5°F | Resp 18 | Ht 67.0 in | Wt 149.6 lb

## 2021-11-30 DIAGNOSIS — M898X9 Other specified disorders of bone, unspecified site: Secondary | ICD-10-CM | POA: Diagnosis not present

## 2021-11-30 DIAGNOSIS — Z171 Estrogen receptor negative status [ER-]: Secondary | ICD-10-CM

## 2021-11-30 DIAGNOSIS — C50411 Malignant neoplasm of upper-outer quadrant of right female breast: Secondary | ICD-10-CM | POA: Diagnosis not present

## 2021-11-30 DIAGNOSIS — Z95828 Presence of other vascular implants and grafts: Secondary | ICD-10-CM

## 2021-11-30 LAB — CBC WITH DIFFERENTIAL (CANCER CENTER ONLY)
Abs Immature Granulocytes: 0.04 10*3/uL (ref 0.00–0.07)
Basophils Absolute: 0 10*3/uL (ref 0.0–0.1)
Basophils Relative: 1 %
Eosinophils Absolute: 0 10*3/uL (ref 0.0–0.5)
Eosinophils Relative: 0 %
HCT: 29.6 % — ABNORMAL LOW (ref 36.0–46.0)
Hemoglobin: 10.2 g/dL — ABNORMAL LOW (ref 12.0–15.0)
Immature Granulocytes: 1 %
Lymphocytes Relative: 17 %
Lymphs Abs: 0.9 10*3/uL (ref 0.7–4.0)
MCH: 32.2 pg (ref 26.0–34.0)
MCHC: 34.5 g/dL (ref 30.0–36.0)
MCV: 93.4 fL (ref 80.0–100.0)
Monocytes Absolute: 0.6 10*3/uL (ref 0.1–1.0)
Monocytes Relative: 11 %
Neutro Abs: 3.8 10*3/uL (ref 1.7–7.7)
Neutrophils Relative %: 70 %
Platelet Count: 285 10*3/uL (ref 150–400)
RBC: 3.17 MIL/uL — ABNORMAL LOW (ref 3.87–5.11)
RDW: 13.7 % (ref 11.5–15.5)
WBC Count: 5.4 10*3/uL (ref 4.0–10.5)
nRBC: 0 % (ref 0.0–0.2)

## 2021-11-30 LAB — CMP (CANCER CENTER ONLY)
ALT: 34 U/L (ref 0–44)
AST: 29 U/L (ref 15–41)
Albumin: 4.1 g/dL (ref 3.5–5.0)
Alkaline Phosphatase: 71 U/L (ref 38–126)
Anion gap: 4 — ABNORMAL LOW (ref 5–15)
BUN: 10 mg/dL (ref 6–20)
CO2: 30 mmol/L (ref 22–32)
Calcium: 9.1 mg/dL (ref 8.9–10.3)
Chloride: 106 mmol/L (ref 98–111)
Creatinine: 0.41 mg/dL — ABNORMAL LOW (ref 0.44–1.00)
GFR, Estimated: >60 mL/min (ref >60)
Glucose, Bld: 120 mg/dL — ABNORMAL HIGH (ref 70–99)
Potassium: 3.8 mmol/L (ref 3.5–5.1)
Sodium: 140 mmol/L (ref 135–145)
Total Bilirubin: 0.2 mg/dL — ABNORMAL LOW (ref 0.3–1.2)
Total Protein: 6.5 g/dL (ref 6.5–8.1)

## 2021-11-30 MED ORDER — SODIUM CHLORIDE 0.9 % IV SOLN: Freq: Once | INTRAVENOUS | Status: AC

## 2021-11-30 MED ORDER — SODIUM CHLORIDE 0.9 % IV SOLN
600.0000 mg/m2 | Freq: Once | INTRAVENOUS | Status: AC
  Administered 2021-11-30: 1080 mg via INTRAVENOUS
  Filled 2021-11-30: qty 54

## 2021-11-30 MED ORDER — SODIUM CHLORIDE 0.9 % IV SOLN
150.0000 mg | Freq: Once | INTRAVENOUS | Status: AC
  Administered 2021-11-30: 150 mg via INTRAVENOUS
  Filled 2021-11-30: qty 150

## 2021-11-30 MED ORDER — PALONOSETRON HCL INJECTION 0.25 MG/5ML
0.2500 mg | Freq: Once | INTRAVENOUS | Status: AC
  Administered 2021-11-30: 0.25 mg via INTRAVENOUS
  Filled 2021-11-30: qty 5

## 2021-11-30 MED ORDER — HEPARIN SOD (PORK) LOCK FLUSH 100 UNIT/ML IV SOLN
500.0000 [IU] | Freq: Once | INTRAVENOUS | Status: AC | PRN
  Administered 2021-11-30: 500 [IU]

## 2021-11-30 MED ORDER — SODIUM CHLORIDE 0.9 % IV SOLN
10.0000 mg | Freq: Once | INTRAVENOUS | Status: AC
  Administered 2021-11-30: 10 mg via INTRAVENOUS
  Filled 2021-11-30: qty 10

## 2021-11-30 MED ORDER — SODIUM CHLORIDE 0.9 % IV SOLN
200.0000 mg | Freq: Once | INTRAVENOUS | Status: AC
  Administered 2021-11-30: 200 mg via INTRAVENOUS
  Filled 2021-11-30: qty 200

## 2021-11-30 MED ORDER — SODIUM CHLORIDE 0.9% FLUSH
10.0000 mL | Freq: Once | INTRAVENOUS | Status: AC
  Administered 2021-11-30: 10 mL

## 2021-11-30 MED ORDER — DOXORUBICIN HCL CHEMO IV INJECTION 2 MG/ML
60.0000 mg/m2 | Freq: Once | INTRAVENOUS | Status: AC
  Administered 2021-11-30: 108 mg via INTRAVENOUS
  Filled 2021-11-30: qty 54

## 2021-11-30 MED ORDER — SODIUM CHLORIDE 0.9% FLUSH
10.0000 mL | INTRAVENOUS | Status: DC | PRN
  Administered 2021-11-30: 10 mL

## 2021-11-30 NOTE — Progress Notes (Signed)
[place] PROGRESS NOTE  Patient Care Team: Rosine Door as PCP - General (Physician Assistant) Rockwell Germany, RN as Oncology Nurse Navigator Mauro Kaufmann, RN as Oncology Nurse Navigator Jovita Kussmaul, MD as Consulting Physician (General Surgery) Benay Pike, MD as Consulting Physician (Hematology and Oncology) Kyung Rudd, MD as Consulting Physician (Radiation Oncology)  CHIEF COMPLAINTS:  Breast cancer  SUMMARY OF ONCOLOGIC HISTORY: Oncology History  Malignant neoplasm of upper-outer quadrant of right breast in female, estrogen receptor negative ([place])  09/25/2021 Imaging   Bilateral screening mammogram with irregular hypoechoic mass in the right breast at 12:00 axis, 5 cm from nipple measuring 2.5 cm corresponding to the palpable area of concern. Ultrasound confirmed irregular hypoechoic mass in the right breast at the 12:00 5 cm from the nipple measuring 2.5 cm corresponding to the palpable area of concern.   09/26/2021 Pathology Results   Right breast needle core biopsy showed invasive ductal carcinoma, high-grade prognostic showed ER 0% negative PR 0% negative Ki-67 of 95% and HER2 negative   09/28/2021 Initial Diagnosis   Malignant neoplasm of upper-outer quadrant of right breast in female, estrogen receptor negative ([place])   10/16/2021 Genetic Testing   Negative hereditary cancer genetic testing: no pathogenic variants detected in [facility] CustomNext-Cancer +RNAinsight Panel.  Report date is October 16, 2021.   The CustomNext-Cancer+RNAinsight panel offered by [facility] includes sequencing and rearrangement analysis for the following 47 genes:  APC, ATM, AXIN2, BARD1, BMPR1A, BRCA1, BRCA2, BRIP1, CDH1, CDK4, CDKN2A, CHEK2, DICER1, EPCAM, GREM1, HOXB13, MEN1, MLH1, MSH2, MSH3, MSH6, MUTYH, NBN, NF1, NF2, NTHL1, PALB2, PMS2, POLD1, POLE, PTEN, RAD51C, RAD51D, RECQL, RET, SDHA, SDHAF2, SDHB, SDHC, SDHD, SMAD4, SMARCA4, STK11, TP53, TSC1, TSC2, and  VHL.  RNA data is routinely analyzed for use in variant interpretation for all genes.    10/20/2021 -  Chemotherapy   Patient is on Treatment Plan : BREAST  Pembrolizumab (200) D1 + AC D1 q21d x 4 cycles / Pembrolizumab (200) D1 + Carboplatin (1.5) D1,8,15 + Paclitaxel (80) D1,8,15 q21d X 4 cycles      INTERVAL HISTORY:  Wendy Webb returns for follow-up before cycle 3 of neoadjuvant treatment with Keytruda, Adriamycin and Cytoxan.  She is accompanied by her husband for this visit. Wendy Webb is tolerating chemotherapy very well except for some muscle aches which started after Neulasta.  She continues to stay active, has not been working but now doing physical work at home including painting walls etc.  She denies any change in breathing.  No significant nausea, vomiting or diarrhea.  She overall has felt well for the most part. Rest of the pertinent 10 point ROS reviewed and negative.  MEDICAL HISTORY:  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[place])    09/26/2021 (right breast biopsy)   Mild mitral regurgitation 06/2019   Palpitations    PONV (postoperative nausea and vomiting)     SURGICAL HISTORY: Past Surgical History:  Procedure Laterality Date   CHOLECYSTECTOMY     GALLBLADDER SURGERY     PORTACATH PLACEMENT Left 10/18/2021   Procedure: PLA CEMENT OF PORT;  Surgeon: Jovita Kussmaul, MD;  Location: [facility] OR;  Service: General;  Laterality: Left;    SOCIAL HISTORY: Social History   Socioeconomic History   Marital status: Married    Spouse name: Not on file  Number of children: 2   Years of education: Not on file   Highest education level: Not on file  Occupational History    Comment: Hair dresser  Tobacco Use   Smoking status: Never   Smokeless tobacco: Never  Vaping Use   Vaping Use: Never used  Substance and Sexual Activity   Alcohol  use: Yes    Alcohol/week: 0.0 standard drinks of alcohol    Comment: Glass wine per day   Drug use: No   Sexual activity: Yes    Partners: Male  Other Topics Concern   Not on file  Social History Narrative   Pt is a hair stylist   Married (3rd marriage)   2 sons (both grown)   Enjoys yard work, hiking, Scientist, clinical (histocompatibility and immunogenetics)   Complete 2 year cosmetology   Has one Neurosurgeon   Social Determinants of Radio broadcast assistant Strain: Not on Art therapist Insecurity: Not on file  Transportation Needs: Not on file  Physical Activity: Not on file  Stress: Not on file  Social Connections: Not on file  Intimate Partner Violence: Not on file    FAMILY HISTORY: Family History  Problem Relation Age of Onset   Hypertension Mother    Heart attack Mother    Heart disease Mother        Atrial fibrillation   Obstructive Sleep Apnea Mother    Hypertension Brother    Stroke Maternal Grandmother 75   Breast cancer Other 45       PGM's sister   Colon cancer Neg Hx    Esophageal cancer Neg Hx     ALLERGIES:  is allergic to ace inhibitors and shellfish allergy.  MEDICATIONS:  Current Outpatient Medications  Medication Sig Dispense Refill   cholecalciferol (VITAMIN D3) 25 MCG (1000 UNIT) tablet Take 1,000 Units by mouth daily.     cyanocobalamin 1000 MCG tablet Take 1,000 mcg by mouth daily.     dexamethasone (DECADRON) 4 MG tablet Take 2 tablets once a day for 3 days after carboplatin and AC chemotherapy. Take with food. 30 tablet 1   lidocaine-prilocaine (EMLA) cream Apply to affected area once 30 g 3   LORazepam (ATIVAN) 0.5 MG tablet Take 0.25 mg by mouth every morning.     metoprolol succinate (TOPROL XL) 25 MG 24 hr tablet Take 1 tablet (25 mg total) by mouth daily. 30 tablet 0   Misc Natural Products (TART CHERRY ADVANCED PO) Take 1 tablet by mouth daily.     Multiple Vitamins-Minerals (MULTIVIT/MULTIMINERAL ADULT) LIQD Take 30 mLs by mouth daily.     ondansetron (ZOFRAN) 8 MG tablet Take 1  tablet (8 mg total) by mouth 2 (two) times daily as needed. Start on the third day after carboplatin and AC chemotherapy. 30 tablet 1   oxyCODONE (ROXICODONE) 5 MG immediate release tablet Take 1 tablet (5 mg total) by mouth every 6 (six) hours as needed for severe pain. 5 tablet 0   prochlorperazine (COMPAZINE) 10 MG tablet Take 1 tablet (10 mg total) by mouth every 6 (six) hours as needed (Nausea or vomiting). 30 tablet 1   No current facility-administered medications for this visit.    REVIEW OF SYSTEMS:   Constitutional: Denies fevers, chills or abnormal night sweats Eyes: Denies blurriness of vision, double vision or watery eyes Ears, nose, mouth, throat, and face: Denies mucositis or sore throat Respiratory: Denies cough, dyspnea or wheezes Cardiovascular: Denies palpitation, chest discomfort or lower extremity swelling Gastrointestinal:  Denies nausea,  heartburn or change in bowel habits Skin: Denies abnormal skin rashes Lymphatics: Denies new lymphadenopathy or easy bruising Neurological:Denies numbness, tingling or new weaknesses Behavioral/Psych: Mood is stable, no new changes    PHYSICAL EXAMINATION: ECOG PERFORMANCE STATUS: 0 - Asymptomatic  Vitals:   11/30/21 1025  BP: 128/73  Pulse: 72  Resp: 18  Temp: (!) 97.5 F (36.4 C)  SpO2: 100%   Filed Weights   11/30/21 1025  Weight: 149 lb 9.6 oz (67.9 kg)    GENERAL:alert, no distress and comfortable SKIN: skin color, texture, turgor are normal, no rashes or significant lesions EYES: normal, conjunctiva are pink and non-injected, sclera clear OROPHARYNX:no exudate, no erythema and lips, buccal mucosa, and tongue normal  NECK: supple, thyroid normal size, non-tender, without nodularity LYMPH:  no palpable lymphadenopathy in the cervical or inguinal LUNGS: clear to auscultation and percussion with normal breathing effort HEART: regular rate & rhythm and no murmurs and no lower extremity edema Musculoskeletal:no  cyanosis of digits and no clubbing  PSYCH: alert & oriented x 3 with fluent speech NEURO: no focal motor/sensory deficits BREAST: Right breast upper inner quadrant mass palpated.  This still measures about 3 cm however some softening has been noted.  No palpable right axillary adenopathy   LABORATORY DATA:  I have reviewed the data as listed Lab Results  Component Value Date   WBC 5.4 11/30/2021   HGB 10.2 (L) 11/30/2021   HCT 29.6 (L) 11/30/2021   MCV 93.4 11/30/2021   PLT 285 11/30/2021   Lab Results  Component Value Date   NA 138 11/10/2021   K 3.9 11/10/2021   CL 105 11/10/2021   CO2 28 11/10/2021    RADIOGRAPHIC STUDIES: I have personally reviewed the radiological reports and agreed with the findings in the report.  ASSESSMENT AND PLAN:  Caydance Kuehnle is a 56 y.o. 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  She receives G-CSF injection on day 3 of each cycle. -- Patient presents today for cycle 3, day 1 of chemoimmunotherapy.  Labs from today were reviewed and adequate for today.   -- Breast exam today with some softening of the right breast upper inner quadrant tumor but no significant improvement in size.  No palpable regional adenopathy.  #Palpitations/Tachycardia: --Started metoprolol 25 mg daily on 10/27/2021 with HR back to baseline in the 60-70's.  --Recommend to hold if hold metoprolol if systolic BP is <58.  -She is tolerating-metoprolol very well.  #Muscle and bone aches Secondary to the growth factor.  This last for about 2 days after each treatment.  #Chemotherapy-induced alopecia  Patient and her husband expressed understanding and  satisfaction with the plan provided.  I have spent a total of 30 minutes minutes of face-to-face and non-face-to-face time, preparing to see the patient,  performing a medically appropriate examination, counseling and educating the patient, documenting clinical information in the electronic health record,and care coordination.   Benay Pike MD

## 2021-11-30 NOTE — Progress Notes (Signed)
[place] PROGRESS NOTE  Patient Care Team: Rosine Door as PCP - General (Physician Assistant) Rockwell Germany, RN as Oncology Nurse Navigator Mauro Kaufmann, RN as Oncology Nurse Navigator Jovita Kussmaul, MD as Consulting Physician (General Surgery) Benay Pike, MD as Consulting Physician (Hematology and Oncology) Kyung Rudd, MD as Consulting Physician (Radiation Oncology)  CHIEF COMPLAINTS:  Breast cancer  SUMMARY OF ONCOLOGIC HISTORY: Oncology History  Malignant neoplasm of upper-outer quadrant of right breast in female, estrogen receptor negative ([place])  09/25/2021 Imaging   Bilateral screening mammogram with irregular hypoechoic mass in the right breast at 12:00 axis, 5 cm from nipple measuring 2.5 cm corresponding to the palpable area of concern. Ultrasound confirmed irregular hypoechoic mass in the right breast at the 12:00 5 cm from the nipple measuring 2.5 cm corresponding to the palpable area of concern.   09/26/2021 Pathology Results   Right breast needle core biopsy showed invasive ductal carcinoma, high-grade prognostic showed ER 0% negative PR 0% negative Ki-67 of 95% and HER2 negative   09/28/2021 Initial Diagnosis   Malignant neoplasm of upper-outer quadrant of right breast in female, estrogen receptor negative ([place])   10/16/2021 Genetic Testing   Negative hereditary cancer genetic testing: no pathogenic variants detected in [facility] CustomNext-Cancer +RNAinsight Panel.  Report date is October 16, 2021.   The CustomNext-Cancer+RNAinsight panel offered by [facility] includes sequencing and rearrangement analysis for the following 47 genes:  APC, ATM, AXIN2, BARD1, BMPR1A, BRCA1, BRCA2, BRIP1, CDH1, CDK4, CDKN2A, CHEK2, DICER1, EPCAM, GREM1, HOXB13, MEN1, MLH1, MSH2, MSH3, MSH6, MUTYH, NBN, NF1, NF2, NTHL1, PALB2, PMS2, POLD1, POLE, PTEN, RAD51C, RAD51D, RECQL, RET, SDHA, SDHAF2, SDHB, SDHC, SDHD, SMAD4, SMARCA4, STK11, TP53, TSC1, TSC2, and  VHL.  RNA data is routinely analyzed for use in variant interpretation for all genes.    10/20/2021 -  Chemotherapy   Patient is on Treatment Plan : BREAST  Pembrolizumab (200) D1 + AC D1 q21d x 4 cycles / Pembrolizumab (200) D1 + Carboplatin (1.5) D1,8,15 + Paclitaxel (80) D1,8,15 q21d X 4 cycles      INTERVAL HISTORY: Aissatou Fronczak returns for follow-up before cycle 2 of neoadjuvant treatment with Keytruda, Adriamycin and Cytoxan.  She is accompanied by her husband for this visit.  Ms. Bayle reports that she felt fatigued for 2 to 3 days following the G-CSF injection.  She tries to remain active and walks every day.  She has a good appetite and denies any weight loss.  Patient denies any nausea vomiting episodes.  Patient has noticed intermittent episodes of mild pelvic cramping, similar to when she had a menstrual cycle.  She denies any triggers that cause the cramping and does not require any pain medication.  She experienced some constipation after the first cycle which has resolved taking MiraLAX.  Patient denies any urinary symptoms.  She experienced mild tingling in her toes after the first treatment which has resolved.  She reports sensitivity to one of her upper teeth and some sensitivity to the cold.  She denies any interference with p.o. intake.  Since starting on metoprolol, her heart rate is back to baseline in the 60s to 70s.  She denies any fevers, chills, night sweats, shortness of breath, chest pain or cough.  She has no other complaints.  Rest of the pertinent 10 point ROS reviewed and negative.  MEDICAL HISTORY:  Past Medical History:  Diagnosis Date   Family history of breast cancer 10/04/2021   Hypertension  IBS (irritable bowel syndrome)    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[place])    09/26/2021 (right breast biopsy)   Mild mitral regurgitation 06/2019   Palpitations    PONV (postoperative nausea and vomiting)     SURGICAL HISTORY: Past Surgical History:   Procedure Laterality Date   CHOLECYSTECTOMY     GALLBLADDER SURGERY     PORTACATH PLACEMENT Left 10/18/2021   Procedure: PLA CEMENT OF PORT;  Surgeon: Jovita Kussmaul, MD;  Location: [facility] OR;  Service: General;  Laterality: Left;    SOCIAL HISTORY: Social History   Socioeconomic History   Marital status: Married    Spouse name: Not on file   Number of children: 2   Years of education: Not on file   Highest education level: Not on file  Occupational History    Comment: Hair dresser  Tobacco Use   Smoking status: Never   Smokeless tobacco: Never  Vaping Use   Vaping Use: Never used  Substance and Sexual Activity   Alcohol use: Yes    Alcohol/week: 0.0 standard drinks of alcohol    Comment: Glass wine per day   Drug use: No   Sexual activity: Yes    Partners: Male  Other Topics Concern   Not on file  Social History Narrative   Pt is a hair stylist   Married (3rd marriage)   2 sons (both grown)   Enjoys yard work, hiking, Scientist, clinical (histocompatibility and immunogenetics)   Complete 2 year cosmetology   Has one Neurosurgeon   Social Determinants of Radio broadcast assistant Strain: Not on Art therapist Insecurity: Not on file  Transportation Needs: Not on file  Physical Activity: Not on file  Stress: Not on file  Social Connections: Not on file  Intimate Partner Violence: Not on file    FAMILY HISTORY: Family History  Problem Relation Age of Onset   Hypertension Mother    Heart attack Mother    Heart disease Mother        Atrial fibrillation   Obstructive Sleep Apnea Mother    Hypertension Brother    Stroke Maternal Grandmother 75   Breast cancer Other 17       PGM's sister   Colon cancer Neg Hx    Esophageal cancer Neg Hx     ALLERGIES:  is allergic to ace inhibitors and shellfish allergy.  MEDICATIONS:  Current Outpatient Medications  Medication Sig Dispense Refill   cholecalciferol (VITAMIN D3) 25 MCG (1000 UNIT) tablet Take 1,000 Units by mouth daily.     cyanocobalamin 1000 MCG tablet Take 1,000  mcg by mouth daily.     dexamethasone (DECADRON) 4 MG tablet Take 2 tablets once a day for 3 days after carboplatin and AC chemotherapy. Take with food. 30 tablet 1   lidocaine-prilocaine (EMLA) cream Apply to affected area once 30 g 3   LORazepam (ATIVAN) 0.5 MG tablet Take 0.25 mg by mouth every morning.     metoprolol succinate (TOPROL XL) 25 MG 24 hr tablet Take 1 tablet (25 mg total) by mouth daily. 30 tablet 0   Misc Natural Products (TART CHERRY ADVANCED PO) Take 1 tablet by mouth daily.     Multiple Vitamins-Minerals (MULTIVIT/MULTIMINERAL ADULT) LIQD Take 30 mLs by mouth daily.     ondansetron (ZOFRAN) 8 MG tablet Take 1 tablet (8 mg total) by mouth 2 (two) times daily as needed. Start on the third day after carboplatin and AC chemotherapy. 30 tablet 1   oxyCODONE (ROXICODONE) 5  MG immediate release tablet Take 1 tablet (5 mg total) by mouth every 6 (six) hours as needed for severe pain. 5 tablet 0   prochlorperazine (COMPAZINE) 10 MG tablet Take 1 tablet (10 mg total) by mouth every 6 (six) hours as needed (Nausea or vomiting). 30 tablet 1   No current facility-administered medications for this visit.    REVIEW OF SYSTEMS:   Constitutional: Denies fevers, chills or abnormal night sweats Eyes: Denies blurriness of vision, double vision or watery eyes Ears, nose, mouth, throat, and face: Denies mucositis or sore throat Respiratory: Denies cough, dyspnea or wheezes Cardiovascular: Denies palpitation, chest discomfort or lower extremity swelling Gastrointestinal:  Denies nausea, heartburn or change in bowel habits Skin: Denies abnormal skin rashes Lymphatics: Denies new lymphadenopathy or easy bruising Neurological:Denies numbness, tingling or new weaknesses Behavioral/Psych: Mood is stable, no new changes  Breast: Denies any palpable lumps or discharge All other systems were reviewed with the patient and are negative.  PHYSICAL EXAMINATION: ECOG PERFORMANCE STATUS: 0 -  Asymptomatic  Vitals:   11/30/21 1025  BP: 128/73  Pulse: 72  Resp: 18  Temp: (!) 97.5 F (36.4 C)  SpO2: 100%   Filed Weights   11/30/21 1025  Weight: 149 lb 9.6 oz (67.9 kg)    GENERAL:alert, no distress and comfortable SKIN: skin color, texture, turgor are normal, no rashes or significant lesions EYES: normal, conjunctiva are pink and non-injected, sclera clear OROPHARYNX:no exudate, no erythema and lips, buccal mucosa, and tongue normal  NECK: supple, thyroid normal size, non-tender, without nodularity LYMPH:  no palpable lymphadenopathy in the cervical or inguinal LUNGS: clear to auscultation and percussion with normal breathing effort HEART: regular rate & rhythm and no murmurs and no lower extremity edema Musculoskeletal:no cyanosis of digits and no clubbing  PSYCH: alert & oriented x 3 with fluent speech NEURO: no focal motor/sensory deficits BREAST: Breast exam deferred today   LABORATORY DATA:  I have reviewed the data as listed Lab Results  Component Value Date   WBC 5.4 11/30/2021   HGB 10.2 (L) 11/30/2021   HCT 29.6 (L) 11/30/2021   MCV 93.4 11/30/2021   PLT 285 11/30/2021   Lab Results  Component Value Date   NA 140 11/30/2021   K 3.8 11/30/2021   CL 106 11/30/2021   CO2 30 11/30/2021    RADIOGRAPHIC STUDIES: I have personally reviewed the radiological reports and agreed with the findings in the report.  ASSESSMENT AND PLAN:  Wendy Webb is a 56 y.o. 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  She receives G-CSF injection on day 3 of each cycle. -- Patient presents today for cycle 2, day 1 of chemoimmunotherapy.  Labs from today  were reviewed and adequate for today.  WBC 5.7, hemoglobin 11.7, platelets 250 K and ANC 3.9.  Okay to proceed with treatment as scheduled without any dose modifications. --RTC on 11/30/2021 for labs, follow up visit with Dr. Chryl Heck before Cycle 3, Day 1.  #Palpitations/Tachycardia: --Started metoprolol 25 mg daily on 10/27/2021 with HR back to baseline in the 60-70's.  --Recommend to hold if hold metoprolol if systolic BP is <28.   #Pelvic cramps: --Mild and no clear etiology --Monitor for now and advised to follow up in clinic if symptoms  worsen.   Patient and her husband expressed understanding and satisfaction with the plan provided.  I have spent a total of 30 minutes minutes of face-to-face and non-face-to-face time, preparing to see the patient,  performing a medically appropriate examination, counseling and educating the patient, documenting clinical information in the electronic health record,and care coordination.    Dede Query PA-C Dept of Hematology and [place] at [facility] Phone: [phone]

## 2021-11-30 NOTE — Patient Instructions (Signed)
[facility] MEDICAL ONCOLOGY  Discharge Instructions: Thank you for choosing [facility] to provide your oncology and hematology care.   If you have a lab appointment with the Cancer Center, please go directly to the Cancer Center and check in at the registration area.   Wear comfortable clothing and clothing appropriate for easy access to any Portacath or PICC line.   We strive to give you quality time with your provider. You may need to reschedule your appointment if you arrive late (15 or more minutes).  Arriving late affects you and other patients whose appointments are after yours.  Also, if you miss three or more appointments without notifying the office, you may be dismissed from the clinic at the provider's discretion.      For prescription refill requests, have your pharmacy contact our office and allow 72 hours for refills to be completed.    Today you received the following chemotherapy and/or immunotherapy agents: pembrolizumab, doxorubicin, cyclophosphamide      To help prevent nausea and vomiting after your treatment, we encourage you to take your nausea medication as directed.  BELOW ARE SYMPTOMS THAT SHOULD BE REPORTED IMMEDIATELY: *FEVER GREATER THAN 100.4 F (38 C) OR HIGHER *CHILLS OR SWEATING *NAUSEA AND VOMITING THAT IS NOT CONTROLLED WITH YOUR NAUSEA MEDICATION *UNUSUAL SHORTNESS OF BREATH *UNUSUAL BRUISING OR BLEEDING *URINARY PROBLEMS (pain or burning when urinating, or frequent urination) *BOWEL PROBLEMS (unusual diarrhea, constipation, pain near the anus) TENDERNESS IN MOUTH AND THROAT WITH OR WITHOUT PRESENCE OF ULCERS (sore throat, sores in mouth, or a toothache) UNUSUAL RASH, SWELLING OR PAIN  UNUSUAL VAGINAL DISCHARGE OR ITCHING   Items with * indicate a potential emergency and should be followed up as soon as possible or go to the Emergency Department if any problems should occur.  Please show the CHEMOTHERAPY ALERT CARD or  IMMUNOTHERAPY ALERT CARD at check-in to the Emergency Department and triage nurse.  Should you have questions after your visit or need to cancel or reschedule your appointment, please contact [facility] MEDICAL ONCOLOGY  Dept: [phone]  and follow the prompts.  Office hours are 8:00 a.m. to 4:30 p.m. Monday - Friday. Please note that voicemails left after 4:00 p.m. may not be returned until the following business day.  We are closed weekends and major holidays. You have access to a nurse at all times for urgent questions. Please call the main number to the clinic Dept: [phone] and follow the prompts.   For any non-urgent questions, you may also contact your provider using MyChart. We now offer e-Visits for anyone 18 and older to request care online for non-urgent symptoms. For details visit [email or web address].   Also download the MyChart app! Go to the app store, search "MyChart", open the app, select [place], and log in with your MyChart username and password.  Masks are optional in the cancer centers. If you would like for your care team to wear a mask while they are taking care of you, please let them know. You may have one support person who is at least 55 years old accompany you for your appointments. 

## 2021-12-02 ENCOUNTER — Inpatient Hospital Stay: Payer: BC Managed Care – PPO

## 2021-12-02 VITALS — BP 115/73 | HR 69 | Temp 98.5°F | Resp 17

## 2021-12-02 DIAGNOSIS — C50411 Malignant neoplasm of upper-outer quadrant of right female breast: Secondary | ICD-10-CM

## 2021-12-02 DIAGNOSIS — Z171 Estrogen receptor negative status [ER-]: Secondary | ICD-10-CM

## 2021-12-02 MED ORDER — PEGFILGRASTIM-CBQV 6 MG/0.6ML ~~LOC~~ SOSY
6.0000 mg | PREFILLED_SYRINGE | Freq: Once | SUBCUTANEOUS | Status: AC
  Administered 2021-12-02: 6 mg via SUBCUTANEOUS

## 2021-12-07 ENCOUNTER — Other Ambulatory Visit: Payer: Self-pay | Admitting: Pharmacist

## 2021-12-07 ENCOUNTER — Other Ambulatory Visit: Payer: Self-pay | Admitting: Hematology and Oncology

## 2021-12-07 ENCOUNTER — Encounter: Payer: Self-pay | Admitting: *Deleted

## 2021-12-07 DIAGNOSIS — Z171 Estrogen receptor negative status [ER-]: Secondary | ICD-10-CM

## 2021-12-07 DIAGNOSIS — C50411 Malignant neoplasm of upper-outer quadrant of right female breast: Secondary | ICD-10-CM

## 2021-12-12 ENCOUNTER — Other Ambulatory Visit: Payer: Self-pay

## 2021-12-12 ENCOUNTER — Encounter: Payer: Self-pay | Admitting: *Deleted

## 2021-12-15 ENCOUNTER — Encounter: Payer: Self-pay | Admitting: Hematology and Oncology

## 2021-12-15 NOTE — Progress Notes (Signed)
Pt has been enrolled in the [employer] program for [place] starting 11/10/21 w/ a maximum benefit of 25,000 per calendar year.  Her OOP is $25 for each dose.

## 2021-12-21 MED FILL — Fosaprepitant Dimeglumine For IV Infusion 150 MG (Base Eq): INTRAVENOUS | Qty: 5 | Status: AC

## 2021-12-21 MED FILL — Dexamethasone Sodium Phosphate Inj 100 MG/10ML: INTRAMUSCULAR | Qty: 1 | Status: AC

## 2021-12-22 ENCOUNTER — Inpatient Hospital Stay: Payer: BC Managed Care – PPO

## 2021-12-22 ENCOUNTER — Inpatient Hospital Stay (HOSPITAL_BASED_OUTPATIENT_CLINIC_OR_DEPARTMENT_OTHER): Payer: BC Managed Care – PPO | Admitting: Hematology and Oncology

## 2021-12-22 ENCOUNTER — Inpatient Hospital Stay: Payer: BC Managed Care – PPO | Attending: Hematology and Oncology

## 2021-12-22 ENCOUNTER — Encounter: Payer: Self-pay | Admitting: *Deleted

## 2021-12-22 ENCOUNTER — Encounter: Payer: Self-pay | Admitting: Hematology and Oncology

## 2021-12-22 ENCOUNTER — Other Ambulatory Visit: Payer: Self-pay | Admitting: *Deleted

## 2021-12-22 VITALS — BP 125/69 | HR 69 | Temp 97.2°F | Resp 18 | Wt 149.6 lb

## 2021-12-22 DIAGNOSIS — Z171 Estrogen receptor negative status [ER-]: Secondary | ICD-10-CM

## 2021-12-22 DIAGNOSIS — C50411 Malignant neoplasm of upper-outer quadrant of right female breast: Secondary | ICD-10-CM

## 2021-12-22 DIAGNOSIS — T451X5A Adverse effect of antineoplastic and immunosuppressive drugs, initial encounter: Secondary | ICD-10-CM | POA: Insufficient documentation

## 2021-12-22 DIAGNOSIS — Z79899 Other long term (current) drug therapy: Secondary | ICD-10-CM | POA: Diagnosis not present

## 2021-12-22 DIAGNOSIS — K589 Irritable bowel syndrome without diarrhea: Secondary | ICD-10-CM | POA: Diagnosis not present

## 2021-12-22 DIAGNOSIS — L659 Nonscarring hair loss, unspecified: Secondary | ICD-10-CM | POA: Insufficient documentation

## 2021-12-22 DIAGNOSIS — Z5111 Encounter for antineoplastic chemotherapy: Secondary | ICD-10-CM | POA: Insufficient documentation

## 2021-12-22 DIAGNOSIS — Z5181 Encounter for therapeutic drug level monitoring: Secondary | ICD-10-CM

## 2021-12-22 DIAGNOSIS — Z5112 Encounter for antineoplastic immunotherapy: Secondary | ICD-10-CM | POA: Insufficient documentation

## 2021-12-22 DIAGNOSIS — I10 Essential (primary) hypertension: Secondary | ICD-10-CM | POA: Diagnosis not present

## 2021-12-22 DIAGNOSIS — M898X9 Other specified disorders of bone, unspecified site: Secondary | ICD-10-CM

## 2021-12-22 DIAGNOSIS — D6481 Anemia due to antineoplastic chemotherapy: Secondary | ICD-10-CM | POA: Insufficient documentation

## 2021-12-22 DIAGNOSIS — Z7952 Long term (current) use of systemic steroids: Secondary | ICD-10-CM | POA: Diagnosis not present

## 2021-12-22 DIAGNOSIS — M2550 Pain in unspecified joint: Secondary | ICD-10-CM | POA: Diagnosis not present

## 2021-12-22 DIAGNOSIS — Z95828 Presence of other vascular implants and grafts: Secondary | ICD-10-CM

## 2021-12-22 LAB — CBC WITH DIFFERENTIAL (CANCER CENTER ONLY)
Abs Immature Granulocytes: 0.02 10*3/uL (ref 0.00–0.07)
Basophils Absolute: 0 10*3/uL (ref 0.0–0.1)
Basophils Relative: 1 %
Eosinophils Absolute: 0 10*3/uL (ref 0.0–0.5)
Eosinophils Relative: 0 %
HCT: 28.2 % — ABNORMAL LOW (ref 36.0–46.0)
Hemoglobin: 9.6 g/dL — ABNORMAL LOW (ref 12.0–15.0)
Immature Granulocytes: 0 %
Lymphocytes Relative: 15 %
Lymphs Abs: 0.7 10*3/uL (ref 0.7–4.0)
MCH: 33 pg (ref 26.0–34.0)
MCHC: 34 g/dL (ref 30.0–36.0)
MCV: 96.9 fL (ref 80.0–100.0)
Monocytes Absolute: 0.6 10*3/uL (ref 0.1–1.0)
Monocytes Relative: 14 %
Neutro Abs: 3.3 10*3/uL (ref 1.7–7.7)
Neutrophils Relative %: 70 %
Platelet Count: 349 10*3/uL (ref 150–400)
RBC: 2.91 MIL/uL — ABNORMAL LOW (ref 3.87–5.11)
RDW: 15.9 % — ABNORMAL HIGH (ref 11.5–15.5)
WBC Count: 4.7 10*3/uL (ref 4.0–10.5)
nRBC: 0 % (ref 0.0–0.2)

## 2021-12-22 LAB — CMP (CANCER CENTER ONLY)
ALT: 36 U/L (ref 0–44)
AST: 33 U/L (ref 15–41)
Albumin: 3.9 g/dL (ref 3.5–5.0)
Alkaline Phosphatase: 63 U/L (ref 38–126)
Anion gap: 6 (ref 5–15)
BUN: 11 mg/dL (ref 6–20)
CO2: 27 mmol/L (ref 22–32)
Calcium: 8.8 mg/dL — ABNORMAL LOW (ref 8.9–10.3)
Chloride: 105 mmol/L (ref 98–111)
Creatinine: 0.45 mg/dL (ref 0.44–1.00)
GFR, Estimated: >60 mL/min (ref >60)
Glucose, Bld: 89 mg/dL (ref 70–99)
Potassium: 3.9 mmol/L (ref 3.5–5.1)
Sodium: 138 mmol/L (ref 135–145)
Total Bilirubin: 0.4 mg/dL (ref 0.3–1.2)
Total Protein: 6.5 g/dL (ref 6.5–8.1)

## 2021-12-22 MED ORDER — SODIUM CHLORIDE 0.9% FLUSH
10.0000 mL | INTRAVENOUS | Status: DC | PRN
  Administered 2021-12-22: 10 mL

## 2021-12-22 MED ORDER — SODIUM CHLORIDE 0.9 % IV SOLN: Freq: Once | INTRAVENOUS | Status: AC

## 2021-12-22 MED ORDER — SODIUM CHLORIDE 0.9 % IV SOLN
10.0000 mg | Freq: Once | INTRAVENOUS | Status: AC
  Administered 2021-12-22: 10 mg via INTRAVENOUS
  Filled 2021-12-22: qty 10

## 2021-12-22 MED ORDER — SODIUM CHLORIDE 0.9 % IV SOLN
600.0000 mg/m2 | Freq: Once | INTRAVENOUS | Status: AC
  Administered 2021-12-22: 1080 mg via INTRAVENOUS
  Filled 2021-12-22: qty 54

## 2021-12-22 MED ORDER — SODIUM CHLORIDE 0.9% FLUSH
10.0000 mL | Freq: Once | INTRAVENOUS | Status: AC
  Administered 2021-12-22: 10 mL

## 2021-12-22 MED ORDER — SODIUM CHLORIDE 0.9 % IV SOLN
150.0000 mg | Freq: Once | INTRAVENOUS | Status: AC
  Administered 2021-12-22: 150 mg via INTRAVENOUS
  Filled 2021-12-22: qty 150

## 2021-12-22 MED ORDER — PALONOSETRON HCL INJECTION 0.25 MG/5ML
0.2500 mg | Freq: Once | INTRAVENOUS | Status: AC
  Administered 2021-12-22: 0.25 mg via INTRAVENOUS
  Filled 2021-12-22: qty 5

## 2021-12-22 MED ORDER — DOXORUBICIN HCL CHEMO IV INJECTION 2 MG/ML
60.0000 mg/m2 | Freq: Once | INTRAVENOUS | Status: AC
  Administered 2021-12-22: 108 mg via INTRAVENOUS
  Filled 2021-12-22: qty 54

## 2021-12-22 MED ORDER — HEPARIN SOD (PORK) LOCK FLUSH 100 UNIT/ML IV SOLN
500.0000 [IU] | Freq: Once | INTRAVENOUS | Status: AC | PRN
  Administered 2021-12-22: 500 [IU]

## 2021-12-22 MED ORDER — SODIUM CHLORIDE 0.9 % IV SOLN
200.0000 mg | Freq: Once | INTRAVENOUS | Status: AC
  Administered 2021-12-22: 200 mg via INTRAVENOUS
  Filled 2021-12-22: qty 8

## 2021-12-22 NOTE — Progress Notes (Signed)
[place] PROGRESS NOTE  Patient Care Team: Rosine Door as PCP - General (Physician Assistant) Rockwell Germany, RN as Oncology Nurse Navigator Mauro Kaufmann, RN as Oncology Nurse Navigator Jovita Kussmaul, MD as Consulting Physician (General Surgery) Benay Pike, MD as Consulting Physician (Hematology and Oncology) Kyung Rudd, MD as Consulting Physician (Radiation Oncology)  CHIEF COMPLAINTS:  Breast cancer  SUMMARY OF ONCOLOGIC HISTORY: Oncology History  Malignant neoplasm of upper-outer quadrant of right breast in female, estrogen receptor negative ([place])  09/25/2021 Imaging   Bilateral screening mammogram with irregular hypoechoic mass in the right breast at 12:00 axis, 5 cm from nipple measuring 2.5 cm corresponding to the palpable area of concern. Ultrasound confirmed irregular hypoechoic mass in the right breast at the 12:00 5 cm from the nipple measuring 2.5 cm corresponding to the palpable area of concern.   09/26/2021 Pathology Results   Right breast needle core biopsy showed invasive ductal carcinoma, high-grade prognostic showed ER 0% negative PR 0% negative Ki-67 of 95% and HER2 negative   09/28/2021 Initial Diagnosis   Malignant neoplasm of upper-outer quadrant of right breast in female, estrogen receptor negative ([place])   10/16/2021 Genetic Testing   Negative hereditary cancer genetic testing: no pathogenic variants detected in [facility] CustomNext-Cancer +RNAinsight Panel.  Report date is October 16, 2021.   The CustomNext-Cancer+RNAinsight panel offered by [facility] includes sequencing and rearrangement analysis for the following 47 genes:  APC, ATM, AXIN2, BARD1, BMPR1A, BRCA1, BRCA2, BRIP1, CDH1, CDK4, CDKN2A, CHEK2, DICER1, EPCAM, GREM1, HOXB13, MEN1, MLH1, MSH2, MSH3, MSH6, MUTYH, NBN, NF1, NF2, NTHL1, PALB2, PMS2, POLD1, POLE, PTEN, RAD51C, RAD51D, RECQL, RET, SDHA, SDHAF2, SDHB, SDHC, SDHD, SMAD4, SMARCA4, STK11, TP53, TSC1, TSC2, and  VHL.  RNA data is routinely analyzed for use in variant interpretation for all genes.    10/20/2021 - 12/02/2021 Chemotherapy   Patient is on Treatment Plan : BREAST  Pembrolizumab (200) D1 + AC D1 q21d x 4 cycles / Pembrolizumab (200) D1 + Carboplatin (1.5) D1,8,15 + Paclitaxel (80) D1,8,15 q21d X 4 cycles     10/20/2021 -  Chemotherapy   Patient is on Treatment Plan : BREAST Pembrolizumab (200) D1 + AC D1 q21d x 4 cycles / Pembrolizumab (200) D1 + Carboplatin (1.5) D1,8,15 + Paclitaxel (80) D1,8,15 q21d X 4 cycles      INTERVAL HISTORY:  Legacie Dillingham returns for follow-up before cycle 4 of neoadjuvant treatment with Keytruda, Adriamycin and Cytoxan.  She is accompanied by her husband for this visit. She is doing really well overall except for the arthralgias from GCSF. She otherwise has noted improvement in the breast mass which is consistent with response and she is very encouraged.  She denies any fevers or chills.  No nausea or vomiting or diarrhea.  No neuropathy reported  Rest of the pertinent 10 point ROS reviewed and negative.  MEDICAL HISTORY:  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[place])    09/26/2021 (right breast biopsy)   Mild mitral regurgitation 06/2019   Palpitations    PONV (postoperative nausea and vomiting)     SURGICAL HISTORY: Past Surgical History:  Procedure Laterality Date   CHOLECYSTECTOMY     GALLBLADDER SURGERY     PORTACATH PLACEMENT Left 10/18/2021   Procedure: PLA CEMENT OF PORT;  Surgeon: Jovita Kussmaul, MD;  Location: [facility]  OR;  Service: General;  Laterality: Left;    SOCIAL HISTORY: Social History   Socioeconomic History   Marital status: Married    Spouse name: Not on file   Number of children: 2   Years of education: Not on file   Highest education level: Not on file  Occupational History    Comment: Hair  dresser  Tobacco Use   Smoking status: Never   Smokeless tobacco: Never  Vaping Use   Vaping Use: Never used  Substance and Sexual Activity   Alcohol use: Yes    Alcohol/week: 0.0 standard drinks of alcohol    Comment: Glass wine per day   Drug use: No   Sexual activity: Yes    Partners: Male  Other Topics Concern   Not on file  Social History Narrative   Pt is a hair stylist   Married (3rd marriage)   2 sons (both grown)   Enjoys yard work, hiking, Scientist, clinical (histocompatibility and immunogenetics)   Complete 2 year cosmetology   Has one Neurosurgeon   Social Determinants of Radio broadcast assistant Strain: Not on Art therapist Insecurity: Not on file  Transportation Needs: Not on file  Physical Activity: Not on file  Stress: Not on file  Social Connections: Not on file  Intimate Partner Violence: Not on file    FAMILY HISTORY: Family History  Problem Relation Age of Onset   Hypertension Mother    Heart attack Mother    Heart disease Mother        Atrial fibrillation   Obstructive Sleep Apnea Mother    Hypertension Brother    Stroke Maternal Grandmother 75   Breast cancer Other 51       PGM's sister   Colon cancer Neg Hx    Esophageal cancer Neg Hx     ALLERGIES:  is allergic to ace inhibitors and shellfish allergy.  MEDICATIONS:  Current Outpatient Medications  Medication Sig Dispense Refill   cholecalciferol (VITAMIN D3) 25 MCG (1000 UNIT) tablet Take 1,000 Units by mouth daily.     cyanocobalamin 1000 MCG tablet Take 1,000 mcg by mouth daily.     dexamethasone (DECADRON) 4 MG tablet Take 2 tablets once a day for 3 days after carboplatin and AC chemotherapy. Take with food. 30 tablet 1   lidocaine-prilocaine (EMLA) cream Apply to affected area once 30 g 3   LORazepam (ATIVAN) 0.5 MG tablet Take 0.25 mg by mouth every morning.     metoprolol succinate (TOPROL XL) 25 MG 24 hr tablet Take 1 tablet (25 mg total) by mouth daily. 30 tablet 0   Misc Natural Products (TART CHERRY ADVANCED PO) Take 1 tablet by  mouth daily.     Multiple Vitamins-Minerals (MULTIVIT/MULTIMINERAL ADULT) LIQD Take 30 mLs by mouth daily.     ondansetron (ZOFRAN) 8 MG tablet Take 1 tablet (8 mg total) by mouth 2 (two) times daily as needed. Start on the third day after carboplatin and AC chemotherapy. 30 tablet 1   oxyCODONE (ROXICODONE) 5 MG immediate release tablet Take 1 tablet (5 mg total) by mouth every 6 (six) hours as needed for severe pain. 5 tablet 0   prochlorperazine (COMPAZINE) 10 MG tablet Take 1 tablet (10 mg total) by mouth every 6 (six) hours as needed (Nausea or vomiting). 30 tablet 1   No current facility-administered medications for this visit.    REVIEW OF SYSTEMS:   Constitutional: Denies fevers, chills or abnormal night sweats Eyes: Denies blurriness of vision, double vision  or watery eyes Ears, nose, mouth, throat, and face: Denies mucositis or sore throat Respiratory: Denies cough, dyspnea or wheezes Cardiovascular: Denies palpitation, chest discomfort or lower extremity swelling Gastrointestinal:  Denies nausea, heartburn or change in bowel habits Skin: Denies abnormal skin rashes Lymphatics: Denies new lymphadenopathy or easy bruising Neurological:Denies numbness, tingling or new weaknesses Behavioral/Psych: Mood is stable, no new changes    PHYSICAL EXAMINATION: ECOG PERFORMANCE STATUS: 0 - Asymptomatic  Vitals:   12/22/21 1209  BP: 125/69  Pulse: 69  Resp: 18  Temp: (!) 97.2 F (36.2 C)  SpO2: 100%   Filed Weights   12/22/21 1209  Weight: 149 lb 9 oz (67.8 kg)    GENERAL:alert, no distress and comfortable SKIN: skin color, texture, turgor are normal, no rashes or significant lesions EYES: normal, conjunctiva are pink and non-injected, sclera clear LUNGS: clear to auscultation and percussion with normal breathing effort HEART: regular rate & rhythm and no murmurs and no lower extremity edema Musculoskeletal:no cyanosis of digits and no clubbing  PSYCH: alert & oriented x 3  with fluent speech NEURO: no focal motor/sensory deficits BREAST: Right breast mass is significantly smaller compared to last visit consistent with response.  No palpable regional adenopathy  LABORATORY DATA:  I have reviewed the data as listed Lab Results  Component Value Date   WBC 4.7 12/22/2021   HGB 9.6 (L) 12/22/2021   HCT 28.2 (L) 12/22/2021   MCV 96.9 12/22/2021   PLT 349 12/22/2021   Lab Results  Component Value Date   NA 140 11/30/2021   K 3.8 11/30/2021   CL 106 11/30/2021   CO2 30 11/30/2021    RADIOGRAPHIC STUDIES: I have personally reviewed the radiological reports and agreed with the findings in the report.  ASSESSMENT AND PLAN:  Wendy Webb is a 56 y.o. 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  She receives G-CSF injection on day 3 of each cycle. -- Patient presents today for cycle 4, day 1 of chemoimmunotherapy.  Labs from today were reviewed and adequate for today.   -- Breast exam today with remarkable improvement, tumor size decreased significantly.  #Muscle and bone aches Secondary to the growth factor.  She can continue claritin and NSAID's sparingly  #Chemotherapy-induced alopecia  #Chemotherapy-induced anemia, mild, no indication for transfusion.  We will continue to monitor  Patient and her husband expressed understanding and satisfaction with the plan provided.  I have spent a total of 30 minutes minutes of face-to-face and non-face-to-face time, preparing to see the patient,  performing a medically appropriate examination, counseling and educating the patient, documenting clinical information in the electronic health record,and  care coordination.   Benay Pike MD

## 2021-12-22 NOTE — Patient Instructions (Signed)
[facility] MEDICAL ONCOLOGY  Discharge Instructions: Thank you for choosing [facility] to provide your oncology and hematology care.   If you have a lab appointment with the Cancer Center, please go directly to the Cancer Center and check in at the registration area.   Wear comfortable clothing and clothing appropriate for easy access to any Portacath or PICC line.   We strive to give you quality time with your provider. You may need to reschedule your appointment if you arrive late (15 or more minutes).  Arriving late affects you and other patients whose appointments are after yours.  Also, if you miss three or more appointments without notifying the office, you may be dismissed from the clinic at the provider's discretion.      For prescription refill requests, have your pharmacy contact our office and allow 72 hours for refills to be completed.    Today you received the following chemotherapy and/or immunotherapy agents: pembrolizumab, doxorubicin, cyclophosphamide      To help prevent nausea and vomiting after your treatment, we encourage you to take your nausea medication as directed.  BELOW ARE SYMPTOMS THAT SHOULD BE REPORTED IMMEDIATELY: *FEVER GREATER THAN 100.4 F (38 C) OR HIGHER *CHILLS OR SWEATING *NAUSEA AND VOMITING THAT IS NOT CONTROLLED WITH YOUR NAUSEA MEDICATION *UNUSUAL SHORTNESS OF BREATH *UNUSUAL BRUISING OR BLEEDING *URINARY PROBLEMS (pain or burning when urinating, or frequent urination) *BOWEL PROBLEMS (unusual diarrhea, constipation, pain near the anus) TENDERNESS IN MOUTH AND THROAT WITH OR WITHOUT PRESENCE OF ULCERS (sore throat, sores in mouth, or a toothache) UNUSUAL RASH, SWELLING OR PAIN  UNUSUAL VAGINAL DISCHARGE OR ITCHING   Items with * indicate a potential emergency and should be followed up as soon as possible or go to the Emergency Department if any problems should occur.  Please show the CHEMOTHERAPY ALERT CARD or  IMMUNOTHERAPY ALERT CARD at check-in to the Emergency Department and triage nurse.  Should you have questions after your visit or need to cancel or reschedule your appointment, please contact [facility]ICAL [facility]  Dept: [phone]  and follow the prompts.  Office hours are 8:00 a.m. to 4:30 p.m. Monday - Friday. Please note that voicemails left after 4:00 p.m. may not be returned until the following business day.  We are closed weekends and major holidays. You have access to a nurse at all times for urgent questions. Please call the main number to the clinic Dept: [phone] and follow the prompts.   For any non-urgent questions, you may also contact your provider using MyChart. We now offer e-Visits for anyone 18 and older to request care online for non-urgent symptoms. For details visit [email or web address].   Also download the MyChart app! Go to the app store, search "MyChart", open the app, select , and log in with your MyChart username and password.  Masks are optional in the cancer centers. If you would like for your care team to wear a mask while they are taking care of you, please let them know. You may have one support person who is at least 55 years old accompany you for your appointments. 

## 2021-12-25 ENCOUNTER — Encounter: Payer: Self-pay | Admitting: *Deleted

## 2021-12-25 ENCOUNTER — Inpatient Hospital Stay: Payer: BC Managed Care – PPO

## 2021-12-25 VITALS — BP 123/79 | HR 78 | Resp 18

## 2021-12-25 DIAGNOSIS — C50411 Malignant neoplasm of upper-outer quadrant of right female breast: Secondary | ICD-10-CM

## 2021-12-25 MED ORDER — PEGFILGRASTIM-CBQV 6 MG/0.6ML ~~LOC~~ SOSY
6.0000 mg | PREFILLED_SYRINGE | Freq: Once | SUBCUTANEOUS | Status: AC
  Administered 2021-12-25: 6 mg via SUBCUTANEOUS
  Filled 2021-12-25: qty 0.6

## 2021-12-26 ENCOUNTER — Encounter: Payer: Self-pay | Admitting: Hematology and Oncology

## 2021-12-27 ENCOUNTER — Other Ambulatory Visit: Payer: Self-pay | Admitting: Hematology and Oncology

## 2021-12-27 DIAGNOSIS — C50411 Malignant neoplasm of upper-outer quadrant of right female breast: Secondary | ICD-10-CM

## 2021-12-27 DIAGNOSIS — Z171 Estrogen receptor negative status [ER-]: Secondary | ICD-10-CM

## 2021-12-28 ENCOUNTER — Encounter: Payer: Self-pay | Admitting: *Deleted

## 2021-12-30 ENCOUNTER — Other Ambulatory Visit: Payer: Self-pay

## 2021-12-31 ENCOUNTER — Encounter: Payer: Self-pay | Admitting: Hematology and Oncology

## 2022-01-01 ENCOUNTER — Encounter: Payer: Self-pay | Admitting: Hematology and Oncology

## 2022-01-01 ENCOUNTER — Telehealth: Payer: Self-pay | Admitting: *Deleted

## 2022-01-01 ENCOUNTER — Encounter: Payer: Self-pay | Admitting: *Deleted

## 2022-01-01 MED ORDER — MAGIC MOUTHWASH W/LIDOCAINE: ORAL | 2 refills | Status: DC

## 2022-01-01 NOTE — Telephone Encounter (Signed)
This RN spoke with pt per her My Chart message- she describes "sores" on uvula,roof of mouth and gums. She states her lip does not have any sores- but " it just bleed a little ".  She denies any history of prior outbreaks suggestive of HSV.  Per review with MD - recommended treating with MMW with maalox /lidocaine/benadryl and let this office know if not improving.  Pt is rinsing with salt water as well as using coconut oil for comfort.  Pharmacy verified and prescription called in.

## 2022-01-03 NOTE — Progress Notes (Unsigned)
HPI: FU palpitations.  Echocardiogram July 2023 showed normal LV function.  Since last seen,  Current Outpatient Medications  Medication Sig Dispense Refill   cholecalciferol (VITAMIN D3) 25 MCG (1000 UNIT) tablet Take 1,000 Units by mouth daily.     cyanocobalamin 1000 MCG tablet Take 1,000 mcg by mouth daily.     dexamethasone (DECADRON) 4 MG tablet Take 2 tablets once a day for 3 days after carboplatin and AC chemotherapy. Take with food. 30 tablet 1   lidocaine-prilocaine (EMLA) cream Apply to affected area once 30 g 3   LORazepam (ATIVAN) 0.5 MG tablet Take 0.25 mg by mouth every morning.     magic mouthwash w/lidocaine SOLN 1:1:1 ratio of lidocaine 2%,maalox,diphenhydramine 12.'5mg'$ /84m 5-10 cc swish swallow or spit QID prn 240 mL 2   metoprolol succinate (TOPROL XL) 25 MG 24 hr tablet Take 1 tablet (25 mg total) by mouth daily. 30 tablet 0   Misc Natural Products (TART CHERRY ADVANCED PO) Take 1 tablet by mouth daily.     Multiple Vitamins-Minerals (MULTIVIT/MULTIMINERAL ADULT) LIQD Take 30 mLs by mouth daily.     ondansetron (ZOFRAN) 8 MG tablet Take 1 tablet (8 mg total) by mouth 2 (two) times daily as needed. Start on the third day after carboplatin and AC chemotherapy. 30 tablet 1   oxyCODONE (ROXICODONE) 5 MG immediate release tablet Take 1 tablet (5 mg total) by mouth every 6 (six) hours as needed for severe pain. 5 tablet 0   prochlorperazine (COMPAZINE) 10 MG tablet Take 1 tablet (10 mg total) by mouth every 6 (six) hours as needed (Nausea or vomiting). 30 tablet 1   No current facility-administered medications for this visit.     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[place]    09/26/2021 (right breast biopsy)   Mild mitral regurgitation 06/2019   Palpitations    PONV (postoperative nausea and vomiting)     Past Surgical History:   Procedure Laterality Date   CHOLECYSTECTOMY     GALLBLADDER SURGERY     PORTACATH PLACEMENT Left 10/18/2021   Procedure: PLA CEMENT OF PORT;  Surgeon: TJovita Kussmaul MD;  [facility];  Service: General;  Laterality: Left;    Social History   Socioeconomic History   Marital status: Married    Spouse name: Not on file   Number of children: 2   Years of education: Not on file   Highest education level: Not on file  Occupational History    Comment: Hair dresser  Tobacco Use   Smoking status: Never   Smokeless tobacco: Never  Vaping Use   Vaping Use: Never used  Substance and Sexual Activity   Alcohol use: Yes    Alcohol/week: 0.0 standard drinks of alcohol    Comment: Glass wine per day   Drug use: No   Sexual activity: Yes    Partners: Male  Other Topics Concern   Not on file  Social History Narrative   Pt is a hair stylist   Married (3rd marriage)   2 sons (both grown)   Enjoys yard work, hiking, wineries   Complete 2 year cosmetology   Has one cNeurosurgeon  Social Determinants of HRadio broadcast assistantStrain: Not on file  Food Insecurity: Not on file  Transportation Needs: Not on file  Physical Activity:  Not on file  Stress: Not on file  Social Connections: Not on file  Intimate Partner Violence: Not on file    Family History  Problem Relation Age of Onset   Hypertension Mother    Heart attack Mother    Heart disease Mother        Atrial fibrillation   Obstructive Sleep Apnea Mother    Hypertension Brother    Stroke Maternal Grandmother 57   Breast cancer Other 42       PGM's sister   Colon cancer Neg Hx    Esophageal cancer Neg Hx     ROS: no fevers or chills, productive cough, hemoptysis, dysphasia, odynophagia, melena, hematochezia, dysuria, hematuria, rash, seizure activity, orthopnea, PND, pedal edema, claudication. Remaining systems are negative.  Physical Exam: Well-developed well-nourished in no acute distress.  Skin is warm and dry.   HEENT is normal.  Neck is supple.  Chest is clear to auscultation with normal expansion.  Cardiovascular exam is regular rate and rhythm.  Abdominal exam nontender or distended. No masses palpated. Extremities show no edema. neuro grossly intact  ECG- personally reviewed  A/P  1 palpitations-  2 hypertension-blood pressure controlled.  Continue present medical regimen.  3 history of mitral regurgitation-trivial on most recent echocardiogram.  Kirk Ruths, MD

## 2022-01-04 ENCOUNTER — Encounter: Payer: Self-pay | Admitting: Cardiology

## 2022-01-04 ENCOUNTER — Ambulatory Visit: Payer: BC Managed Care – PPO | Attending: Cardiology | Admitting: Cardiology

## 2022-01-04 VITALS — BP 112/70 | HR 82 | Ht 67.0 in | Wt 153.8 lb

## 2022-01-04 DIAGNOSIS — I340 Nonrheumatic mitral (valve) insufficiency: Secondary | ICD-10-CM

## 2022-01-04 DIAGNOSIS — I10 Essential (primary) hypertension: Secondary | ICD-10-CM

## 2022-01-04 DIAGNOSIS — R002 Palpitations: Secondary | ICD-10-CM | POA: Diagnosis not present

## 2022-01-04 NOTE — Patient Instructions (Signed)
  Follow-Up: At [facility], you and your health needs are our priority.  As part of our continuing mission to provide you with exceptional heart care, we have created designated Provider Care Teams.  These Care Teams include your primary Cardiologist (physician) and Advanced Practice Providers (APPs -  Physician Assistants and Nurse Practitioners) who all work together to provide you with the care you need, when you need it.  We recommend signing up for the patient portal called "MyChart".  Sign up information is provided on this After Visit Summary.  MyChart is used to connect with patients for Virtual Visits (Telemedicine).  Patients are able to view lab/test results, encounter notes, upcoming appointments, etc.  Non-urgent messages can be sent to your provider as well.   To learn more about what you can do with MyChart, go to [email or web address].    Your next appointment:   6 month(s)  The format for your next appointment:   In Person  Provider:  Brian Crenshaw MD     

## 2022-01-05 ENCOUNTER — Inpatient Hospital Stay: Payer: BC Managed Care – PPO

## 2022-01-05 ENCOUNTER — Other Ambulatory Visit: Payer: Self-pay | Admitting: Hematology and Oncology

## 2022-01-05 ENCOUNTER — Other Ambulatory Visit: Payer: Self-pay

## 2022-01-05 DIAGNOSIS — Z171 Estrogen receptor negative status [ER-]: Secondary | ICD-10-CM

## 2022-01-05 DIAGNOSIS — C50411 Malignant neoplasm of upper-outer quadrant of right female breast: Secondary | ICD-10-CM

## 2022-01-05 LAB — CBC WITH DIFFERENTIAL/PLATELET
Abs Immature Granulocytes: 0.17 10*3/uL — ABNORMAL HIGH (ref 0.00–0.07)
Basophils Absolute: 0 10*3/uL (ref 0.0–0.1)
Basophils Relative: 1 %
Eosinophils Absolute: 0 10*3/uL (ref 0.0–0.5)
Eosinophils Relative: 0 %
HCT: 24.2 % — ABNORMAL LOW (ref 36.0–46.0)
Hemoglobin: 8.1 g/dL — ABNORMAL LOW (ref 12.0–15.0)
Immature Granulocytes: 2 %
Lymphocytes Relative: 12 %
Lymphs Abs: 1 10*3/uL (ref 0.7–4.0)
MCH: 33.5 pg (ref 26.0–34.0)
MCHC: 33.5 g/dL (ref 30.0–36.0)
MCV: 100 fL (ref 80.0–100.0)
Monocytes Absolute: 0.8 10*3/uL (ref 0.1–1.0)
Monocytes Relative: 10 %
Neutro Abs: 6.2 10*3/uL (ref 1.7–7.7)
Neutrophils Relative %: 75 %
Platelets: 202 10*3/uL (ref 150–400)
RBC: 2.42 MIL/uL — ABNORMAL LOW (ref 3.87–5.11)
RDW: 15.7 % — ABNORMAL HIGH (ref 11.5–15.5)
WBC: 8.1 10*3/uL (ref 4.0–10.5)
nRBC: 0.6 % — ABNORMAL HIGH (ref 0.0–0.2)

## 2022-01-05 LAB — COMPREHENSIVE METABOLIC PANEL
ALT: 17 U/L (ref 0–44)
AST: 15 U/L (ref 15–41)
Albumin: 3.9 g/dL (ref 3.5–5.0)
Alkaline Phosphatase: 84 U/L (ref 38–126)
Anion gap: 3 — ABNORMAL LOW (ref 5–15)
BUN: 9 mg/dL (ref 6–20)
CO2: 32 mmol/L (ref 22–32)
Calcium: 8.8 mg/dL — ABNORMAL LOW (ref 8.9–10.3)
Chloride: 105 mmol/L (ref 98–111)
Creatinine, Ser: 0.54 mg/dL (ref 0.44–1.00)
GFR, Estimated: >60 mL/min (ref >60)
Glucose, Bld: 116 mg/dL — ABNORMAL HIGH (ref 70–99)
Potassium: 4.1 mmol/L (ref 3.5–5.1)
Sodium: 140 mmol/L (ref 135–145)
Total Bilirubin: 0.2 mg/dL — ABNORMAL LOW (ref 0.3–1.2)
Total Protein: 6.4 g/dL — ABNORMAL LOW (ref 6.5–8.1)

## 2022-01-05 NOTE — Progress Notes (Signed)
The pharmacy team has substituted IV diphenhydramine for IV cetirizine as a premedication. Patient will be monitored for hypersensitivity reaction and adverse reactions to IV cetirizine. Thanks.   Kennith Center, Pharm.D., CPP 01/05/2022'@1'$ :14 PM

## 2022-01-06 ENCOUNTER — Other Ambulatory Visit: Payer: Self-pay | Admitting: Hematology and Oncology

## 2022-01-06 DIAGNOSIS — Z171 Estrogen receptor negative status [ER-]: Secondary | ICD-10-CM

## 2022-01-06 DIAGNOSIS — C50411 Malignant neoplasm of upper-outer quadrant of right female breast: Secondary | ICD-10-CM

## 2022-01-06 NOTE — Progress Notes (Signed)
I asked for TSH specifically, somehow this lab wasn't drawn. I suspect the lab didn't quite cross through, so I ordered it again to be drawn this Friday.  Keva Darty

## 2022-01-08 ENCOUNTER — Telehealth: Payer: Self-pay | Admitting: *Deleted

## 2022-01-08 ENCOUNTER — Inpatient Hospital Stay: Payer: BC Managed Care – PPO | Attending: Hematology and Oncology

## 2022-01-08 ENCOUNTER — Other Ambulatory Visit: Payer: Self-pay

## 2022-01-08 DIAGNOSIS — Z7952 Long term (current) use of systemic steroids: Secondary | ICD-10-CM | POA: Insufficient documentation

## 2022-01-08 DIAGNOSIS — L659 Nonscarring hair loss, unspecified: Secondary | ICD-10-CM | POA: Insufficient documentation

## 2022-01-08 DIAGNOSIS — Z5112 Encounter for antineoplastic immunotherapy: Secondary | ICD-10-CM | POA: Diagnosis not present

## 2022-01-08 DIAGNOSIS — R002 Palpitations: Secondary | ICD-10-CM | POA: Insufficient documentation

## 2022-01-08 DIAGNOSIS — I10 Essential (primary) hypertension: Secondary | ICD-10-CM | POA: Insufficient documentation

## 2022-01-08 DIAGNOSIS — K589 Irritable bowel syndrome without diarrhea: Secondary | ICD-10-CM | POA: Insufficient documentation

## 2022-01-08 DIAGNOSIS — T451X5A Adverse effect of antineoplastic and immunosuppressive drugs, initial encounter: Secondary | ICD-10-CM | POA: Insufficient documentation

## 2022-01-08 DIAGNOSIS — C50411 Malignant neoplasm of upper-outer quadrant of right female breast: Secondary | ICD-10-CM | POA: Insufficient documentation

## 2022-01-08 DIAGNOSIS — Z79899 Other long term (current) drug therapy: Secondary | ICD-10-CM | POA: Insufficient documentation

## 2022-01-08 DIAGNOSIS — Z803 Family history of malignant neoplasm of breast: Secondary | ICD-10-CM | POA: Insufficient documentation

## 2022-01-08 DIAGNOSIS — Z171 Estrogen receptor negative status [ER-]: Secondary | ICD-10-CM | POA: Diagnosis not present

## 2022-01-08 DIAGNOSIS — D6481 Anemia due to antineoplastic chemotherapy: Secondary | ICD-10-CM | POA: Insufficient documentation

## 2022-01-08 DIAGNOSIS — Z5111 Encounter for antineoplastic chemotherapy: Secondary | ICD-10-CM | POA: Diagnosis not present

## 2022-01-08 DIAGNOSIS — I340 Nonrheumatic mitral (valve) insufficiency: Secondary | ICD-10-CM | POA: Insufficient documentation

## 2022-01-08 LAB — TSH: TSH: 2.453 u[IU]/mL (ref 0.350–4.500)

## 2022-01-08 NOTE — Telephone Encounter (Signed)
This RN spoke with lab - per TSH orders not drawn on 9/29- per Lonnie in lab- pt will not be charged for the lab draw visit.  Noted the TSH with reflex is not a viewable order- but pt had a standing TSH that was not drawn. He is aware.  This RN called the pt and apologized for the oversight and explained above plan- appt made this RN.

## 2022-01-11 MED FILL — Dexamethasone Sodium Phosphate Inj 100 MG/10ML: INTRAMUSCULAR | Qty: 1 | Status: AC

## 2022-01-12 ENCOUNTER — Encounter: Payer: Self-pay | Admitting: Hematology and Oncology

## 2022-01-12 ENCOUNTER — Inpatient Hospital Stay (HOSPITAL_BASED_OUTPATIENT_CLINIC_OR_DEPARTMENT_OTHER): Payer: BC Managed Care – PPO | Admitting: Hematology and Oncology

## 2022-01-12 ENCOUNTER — Inpatient Hospital Stay: Payer: BC Managed Care – PPO

## 2022-01-12 VITALS — BP 119/75 | HR 75 | Temp 97.7°F | Resp 12

## 2022-01-12 DIAGNOSIS — C50411 Malignant neoplasm of upper-outer quadrant of right female breast: Secondary | ICD-10-CM | POA: Diagnosis not present

## 2022-01-12 DIAGNOSIS — Z171 Estrogen receptor negative status [ER-]: Secondary | ICD-10-CM

## 2022-01-12 DIAGNOSIS — Z95828 Presence of other vascular implants and grafts: Secondary | ICD-10-CM

## 2022-01-12 LAB — CBC WITH DIFFERENTIAL (CANCER CENTER ONLY)
Abs Immature Granulocytes: 0.02 10*3/uL (ref 0.00–0.07)
Basophils Absolute: 0 10*3/uL (ref 0.0–0.1)
Basophils Relative: 0 %
Eosinophils Absolute: 0 10*3/uL (ref 0.0–0.5)
Eosinophils Relative: 0 %
HCT: 28.2 % — ABNORMAL LOW (ref 36.0–46.0)
Hemoglobin: 9.2 g/dL — ABNORMAL LOW (ref 12.0–15.0)
Immature Granulocytes: 0 %
Lymphocytes Relative: 13 %
Lymphs Abs: 0.6 10*3/uL — ABNORMAL LOW (ref 0.7–4.0)
MCH: 33.1 pg (ref 26.0–34.0)
MCHC: 32.6 g/dL (ref 30.0–36.0)
MCV: 101.4 fL — ABNORMAL HIGH (ref 80.0–100.0)
Monocytes Absolute: 0.8 10*3/uL (ref 0.1–1.0)
Monocytes Relative: 16 %
Neutro Abs: 3.4 10*3/uL (ref 1.7–7.7)
Neutrophils Relative %: 71 %
Platelet Count: 346 10*3/uL (ref 150–400)
RBC: 2.78 MIL/uL — ABNORMAL LOW (ref 3.87–5.11)
RDW: 16.3 % — ABNORMAL HIGH (ref 11.5–15.5)
WBC Count: 4.8 10*3/uL (ref 4.0–10.5)
nRBC: 0 % (ref 0.0–0.2)

## 2022-01-12 LAB — CMP (CANCER CENTER ONLY)
ALT: 29 U/L (ref 0–44)
AST: 29 U/L (ref 15–41)
Albumin: 4.1 g/dL (ref 3.5–5.0)
Alkaline Phosphatase: 66 U/L (ref 38–126)
Anion gap: 4 — ABNORMAL LOW (ref 5–15)
BUN: 12 mg/dL (ref 6–20)
CO2: 29 mmol/L (ref 22–32)
Calcium: 8.9 mg/dL (ref 8.9–10.3)
Chloride: 106 mmol/L (ref 98–111)
Creatinine: 0.54 mg/dL (ref 0.44–1.00)
GFR, Estimated: >60 mL/min (ref >60)
Glucose, Bld: 91 mg/dL (ref 70–99)
Potassium: 4 mmol/L (ref 3.5–5.1)
Sodium: 139 mmol/L (ref 135–145)
Total Bilirubin: 0.3 mg/dL (ref 0.3–1.2)
Total Protein: 6.8 g/dL (ref 6.5–8.1)

## 2022-01-12 LAB — TSH: TSH: 2.509 u[IU]/mL (ref 0.350–4.500)

## 2022-01-12 MED ORDER — SODIUM CHLORIDE 0.9 % IV SOLN
200.0000 mg | Freq: Once | INTRAVENOUS | Status: AC
  Administered 2022-01-12: 200 mg via INTRAVENOUS
  Filled 2022-01-12: qty 200

## 2022-01-12 MED ORDER — HEPARIN SOD (PORK) LOCK FLUSH 100 UNIT/ML IV SOLN
500.0000 [IU] | Freq: Once | INTRAVENOUS | Status: AC | PRN
  Administered 2022-01-12: 500 [IU]

## 2022-01-12 MED ORDER — FAMOTIDINE IN NACL 20-0.9 MG/50ML-% IV SOLN
20.0000 mg | Freq: Once | INTRAVENOUS | Status: AC
  Administered 2022-01-12: 20 mg via INTRAVENOUS
  Filled 2022-01-12: qty 50

## 2022-01-12 MED ORDER — SODIUM CHLORIDE 0.9 % IV SOLN
10.0000 mg | Freq: Once | INTRAVENOUS | Status: AC
  Administered 2022-01-12: 10 mg via INTRAVENOUS
  Filled 2022-01-12: qty 10

## 2022-01-12 MED ORDER — SODIUM CHLORIDE 0.9 % IV SOLN
165.3000 mg | Freq: Once | INTRAVENOUS | Status: AC
  Administered 2022-01-12: 170 mg via INTRAVENOUS
  Filled 2022-01-12: qty 17

## 2022-01-12 MED ORDER — PALONOSETRON HCL INJECTION 0.25 MG/5ML
0.2500 mg | Freq: Once | INTRAVENOUS | Status: AC
  Administered 2022-01-12: 0.25 mg via INTRAVENOUS
  Filled 2022-01-12: qty 5

## 2022-01-12 MED ORDER — DEXAMETHASONE 4 MG PO TABS: ORAL_TABLET | ORAL | 1 refills | Status: DC

## 2022-01-12 MED ORDER — SODIUM CHLORIDE 0.9% FLUSH
10.0000 mL | Freq: Once | INTRAVENOUS | Status: AC
  Administered 2022-01-12: 10 mL

## 2022-01-12 MED ORDER — CETIRIZINE HCL 10 MG/ML IV SOLN
10.0000 mg | Freq: Once | INTRAVENOUS | Status: AC
  Administered 2022-01-12: 10 mg via INTRAVENOUS
  Filled 2022-01-12: qty 1

## 2022-01-12 MED ORDER — SODIUM CHLORIDE 0.9 % IV SOLN: Freq: Once | INTRAVENOUS | Status: AC

## 2022-01-12 MED ORDER — SODIUM CHLORIDE 0.9% FLUSH
10.0000 mL | INTRAVENOUS | Status: DC | PRN
  Administered 2022-01-12: 10 mL

## 2022-01-12 MED ORDER — SODIUM CHLORIDE 0.9 % IV SOLN
80.0000 mg/m2 | Freq: Once | INTRAVENOUS | Status: AC
  Administered 2022-01-12: 144 mg via INTRAVENOUS
  Filled 2022-01-12: qty 24

## 2022-01-12 NOTE — Progress Notes (Signed)
[facility] PROGRESS NOTE  Patient Care Team: Rosine Door as PCP - General (Physician Assistant) Rockwell Germany, RN as Oncology Nurse Navigator Mauro Kaufmann, RN as Oncology Nurse Navigator Jovita Kussmaul, MD as Consulting Physician (General Surgery) Benay Pike, MD as Consulting Physician (Hematology and Oncology) Kyung Rudd, MD as Consulting Physician (Radiation Oncology)  CHIEF COMPLAINTS:  Breast cancer  SUMMARY OF ONCOLOGIC HISTORY: Oncology History  Malignant neoplasm of upper-outer quadrant of right breast in female, estrogen receptor negative ([place])  09/25/2021 Imaging   Bilateral screening mammogram with irregular hypoechoic mass in the right breast at 12:00 axis, 5 cm from nipple measuring 2.5 cm corresponding to the palpable area of concern. Ultrasound confirmed irregular hypoechoic mass in the right breast at the 12:00 5 cm from the nipple measuring 2.5 cm corresponding to the palpable area of concern.   09/26/2021 Pathology Results   Right breast needle core biopsy showed invasive ductal carcinoma, high-grade prognostic showed ER 0% negative PR 0% negative Ki-67 of 95% and HER2 negative   09/28/2021 Initial Diagnosis   Malignant neoplasm of upper-outer quadrant of right breast in female, estrogen receptor negative ([place])   10/16/2021 Genetic Testing   Negative hereditary cancer genetic testing: no pathogenic variants detected in [facility] CustomNext-Cancer +RNAinsight Panel.  Report date is October 16, 2021.   The CustomNext-Cancer+RNAinsight panel offered by [facility] includes sequencing and rearrangement analysis for the following 47 genes:  APC, ATM, AXIN2, BARD1, BMPR1A, BRCA1, BRCA2, BRIP1, CDH1, CDK4, CDKN2A, CHEK2, DICER1, EPCAM, GREM1, HOXB13, MEN1, MLH1, MSH2, MSH3, MSH6, MUTYH, NBN, NF1, NF2, NTHL1, PALB2, PMS2, POLD1, POLE, PTEN, RAD51C, RAD51D, RECQL, RET, SDHA, SDHAF2, SDHB, SDHC, SDHD, SMAD4, SMARCA4, STK11, TP53, TSC1, TSC2, and  VHL.  RNA data is routinely analyzed for use in variant interpretation for all genes.    10/20/2021 - 12/02/2021 Chemotherapy   Patient is on Treatment Plan : BREAST  Pembrolizumab (200) D1 + AC D1 q21d x 4 cycles / Pembrolizumab (200) D1 + Carboplatin (1.5) D1,8,15 + Paclitaxel (80) D1,8,15 q21d X 4 cycles     10/20/2021 -  Chemotherapy   Patient is on Treatment Plan : BREAST Pembrolizumab (200) D1 + AC D1 q21d x 4 cycles / Pembrolizumab (200) D1 + Carboplatin (1.5) D1,8,15 + Paclitaxel (80) D1,8,15 q21d X 4 cycles      INTERVAL HISTORY:  Shantara Goosby returns for follow-up before cycle 4 of neoadjuvant treatment with Keytruda, Adriamycin and Cytoxan.  She is accompanied by her husband for this visit. She still continues to have intermittent palpitations. Since last visit, she thinks the breast mass is better,  Rest of the pertinent 10 point ROS reviewed and negative.  MEDICAL HISTORY:  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[place])    09/26/2021 (right breast biopsy)   Mild mitral regurgitation 06/2019   Palpitations    PONV (postoperative nausea and vomiting)     SURGICAL HISTORY: Past Surgical History:  Procedure Laterality Date   CHOLECYSTECTOMY     GALLBLADDER SURGERY     PORTACATH PLACEMENT Left 10/18/2021   Procedure: PLA CEMENT OF PORT;  Surgeon: Jovita Kussmaul, MD;  Location: [facility] OR;  Service: General;  Laterality: Left;    SOCIAL HISTORY: Social History   Socioeconomic History   Marital status: Married    Spouse name: Not on file  Number of children: 2   Years of education: Not on file   Highest education level: Not on file  Occupational History    Comment: Hair dresser  Tobacco Use   Smoking status: Never   Smokeless tobacco: Never  Vaping Use   Vaping Use: Never used  Substance and Sexual Activity   Alcohol use: Yes     Alcohol/week: 0.0 standard drinks of alcohol    Comment: Glass wine per day   Drug use: No   Sexual activity: Yes    Partners: Male  Other Topics Concern   Not on file  Social History Narrative   Pt is a hair stylist   Married (3rd marriage)   2 sons (both grown)   Enjoys yard work, hiking, Scientist, clinical (histocompatibility and immunogenetics)   Complete 2 year cosmetology   Has one Neurosurgeon   Social Determinants of Radio broadcast assistant Strain: Not on Art therapist Insecurity: Not on file  Transportation Needs: Not on file  Physical Activity: Not on file  Stress: Not on file  Social Connections: Not on file  Intimate Partner Violence: Not on file    FAMILY HISTORY: Family History  Problem Relation Age of Onset   Hypertension Mother    Heart attack Mother    Heart disease Mother        Atrial fibrillation   Obstructive Sleep Apnea Mother    Hypertension Brother    Stroke Maternal Grandmother 75   Breast cancer Other 88       PGM's sister   Colon cancer Neg Hx    Esophageal cancer Neg Hx     ALLERGIES:  is allergic to ace inhibitors and shellfish allergy.  MEDICATIONS:  Current Outpatient Medications  Medication Sig Dispense Refill   cholecalciferol (VITAMIN D3) 25 MCG (1000 UNIT) tablet Take 1,000 Units by mouth daily.     cyanocobalamin 1000 MCG tablet Take 1,000 mcg by mouth daily.     dexamethasone (DECADRON) 4 MG tablet Take 2 tablets once a day for 3 days after carboplatin and AC chemotherapy. Take with food. 30 tablet 1   lidocaine-prilocaine (EMLA) cream Apply to affected area once 30 g 3   LORazepam (ATIVAN) 0.5 MG tablet Take 0.25 mg by mouth every morning.     magic mouthwash w/lidocaine SOLN 1:1:1 ratio of lidocaine 2%,maalox,diphenhydramine 12.5m/5ml 5-10 cc swish swallow or spit QID prn 240 mL 2   metoprolol succinate (TOPROL XL) 25 MG 24 hr tablet Take 1 tablet (25 mg total) by mouth daily. 30 tablet 0   Misc Natural Products (TART CHERRY ADVANCED PO) Take 1 tablet by mouth daily. (Patient not  taking: Reported on 01/04/2022)     Multiple Vitamins-Minerals (MULTIVIT/MULTIMINERAL ADULT) LIQD Take 30 mLs by mouth daily.     PREVIDENT 5000 BOOSTER PLUS 1.1 % PSTE Place onto teeth as directed.     prochlorperazine (COMPAZINE) 10 MG tablet Take 1 tablet (10 mg total) by mouth every 6 (six) hours as needed (Nausea or vomiting). (Patient not taking: Reported on 01/04/2022) 30 tablet 1   No current facility-administered medications for this visit.    REVIEW OF SYSTEMS:   Constitutional: Denies fevers, chills or abnormal night sweats Eyes: Denies blurriness of vision, double vision or watery eyes Ears, nose, mouth, throat, and face: Denies mucositis or sore throat Respiratory: Denies cough, dyspnea or wheezes Cardiovascular: Denies palpitation, chest discomfort or lower extremity swelling Gastrointestinal:  Denies nausea, heartburn or change in bowel habits Skin: Denies abnormal skin rashes Lymphatics:  Denies new lymphadenopathy or easy bruising Neurological:Denies numbness, tingling or new weaknesses Behavioral/Psych: Mood is stable, no new changes    PHYSICAL EXAMINATION: ECOG PERFORMANCE STATUS: 0 - Asymptomatic  Vitals:   01/12/22 0942  BP: 126/71  Pulse: 66  Resp: 16  Temp: 97.8 F (36.6 C)  SpO2: 100%   Filed Weights   01/12/22 0942  Weight: 152 lb 8 oz (69.2 kg)    Physical Exam Chest:       Comments: 3 * 3cms right breast mass at 12 0 clock, No clear evidence of axillary adenopathy, some abnormal density right axilla.  No significant change in the breast mass, ? Mild increase?   LABORATORY DATA:  I have reviewed the data as listed Lab Results  Component Value Date   WBC 4.8 01/12/2022   HGB 9.2 (L) 01/12/2022   HCT 28.2 (L) 01/12/2022   MCV 101.4 (H) 01/12/2022   PLT 346 01/12/2022   Lab Results  Component Value Date   NA 139 01/12/2022   K 4.0 01/12/2022   CL 106 01/12/2022   CO2 29 01/12/2022    RADIOGRAPHIC STUDIES: I have personally  reviewed the radiological reports and agreed with the findings in the report.  ASSESSMENT AND PLAN:  Wendy Webb is a 56 y.o. 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  She receives G-CSF injection on day 3 of each cycle. -- Patient presents today for cycle 4, day 1 of chemoimmunotherapy.  Labs from today were reviewed and adequate for today.   -- Breast exam today with no improvement over all. I wonder if she is responding to neoadjuvant therapy.  Today she will be switching to the [facility] regimen.  Have also ordered an ultrasound for response assessment since the tumor size has not significantly changed in the past 1 month. -- If she does not respond to chemotherapy as expected, we may want consider surgery sooner than anticipated and consider adjuvant immunotherapy and Xeloda  #Muscle and bone aches Secondary to the growth factor.  She can continue claritin and NSAID's sparingly  #Chemotherapy-induced alopecia Chemotherapy-induced anemia, mild, no indication for transfusion.  We will continue to monitor  #Palpitations not related to hypothyroidism, TSH normal.  Also this symptom is intermittent which makes it inconsistent with hypothyroidism. When I see her again in about 2 weeks to assess her tumor size with change in chemotherapy. Patient and her husband expressed understanding and satisfaction with the plan provided.  I have spent a total of 30 minutes minutes of face-to-face and non-face-to-face time, preparing to see the patient,  performing a medically appropriate examination, counseling and educating the patient, documenting clinical information in the  electronic health record,and care coordination.   Benay Pike MD

## 2022-01-12 NOTE — Patient Instructions (Signed)
[facility] ONCOLOGY  Discharge Instructions: Thank you for choosing [facility] to provide your oncology and hematology care.   If you have a lab appointment with the [facility], please go directly to the [facility] and check in at the registration area.   Wear comfortable clothing and clothing appropriate for easy access to any Portacath or PICC line.   We strive to give you quality time with your provider. You may need to reschedule your appointment if you arrive late (15 or more minutes).  Arriving late affects you and other patients whose appointments are after yours.  Also, if you miss three or more appointments without notifying the office, you may be dismissed from the clinic at the provider's discretion.      For prescription refill requests, have your pharmacy contact our office and allow 72 hours for refills to be completed.    Today you received the following chemotherapy and/or immunotherapy agents: Paclitaxel, Carboplatin, Keytruda.       To help prevent nausea and vomiting after your treatment, we encourage you to take your nausea medication as directed.  BELOW ARE SYMPTOMS THAT SHOULD BE REPORTED IMMEDIATELY: *FEVER GREATER THAN 100.4 F (38 C) OR HIGHER *CHILLS OR SWEATING *NAUSEA AND VOMITING THAT IS NOT CONTROLLED WITH YOUR NAUSEA MEDICATION *UNUSUAL SHORTNESS OF BREATH *UNUSUAL BRUISING OR BLEEDING *URINARY PROBLEMS (pain or burning when urinating, or frequent urination) *BOWEL PROBLEMS (unusual diarrhea, constipation, pain near the anus) TENDERNESS IN MOUTH AND THROAT WITH OR WITHOUT PRESENCE OF ULCERS (sore throat, sores in mouth, or a toothache) UNUSUAL RASH, SWELLING OR PAIN  UNUSUAL VAGINAL DISCHARGE OR ITCHING   Items with * indicate a potential emergency and should be followed up as soon as possible or go to the Emergency Department if any problems should occur.  Please show the CHEMOTHERAPY ALERT CARD or IMMUNOTHERAPY  ALERT CARD at check-in to the Emergency Department and triage nurse.  Should you have questions after your visit or need to cancel or reschedule your appointment, please contact [facility]  Dept: [phone]  and follow the prompts.  Office hours are 8:00 a.m. to 4:30 p.m. Monday - Friday. Please note that voicemails left after 4:00 p.m. may not be returned until the following business day.  We are closed weekends and major holidays. You have access to a nurse at all times for urgent questions. Please call the main number to the clinic Dept: [phone] and follow the prompts.   For any non-urgent questions, you may also contact your provider using MyChart. We now offer e-Visits for anyone 40 and older to request care online for non-urgent symptoms. For details visit [email or web address].   Also download the MyChart app! Go to the app store, search "MyChart", open the app, select [facility], and log in with your MyChart username and password.  Masks are optional in the cancer centers. If you would like for your care team to wear a mask while they are taking care of you, please let them know. You may have one support person who is at least 55 years old accompany you for your appointments.

## 2022-01-15 ENCOUNTER — Telehealth: Payer: Self-pay

## 2022-01-15 DIAGNOSIS — C50411 Malignant neoplasm of upper-outer quadrant of right female breast: Secondary | ICD-10-CM

## 2022-01-15 NOTE — Telephone Encounter (Signed)
-----   Message from Jesse Fall, RN sent at 01/12/2022  6:25 PM EDT ----- Regarding: Pt call back [facility] Paclitaxel & Carboplatin.  Pt had treatment prior.  Had [place] today also.  Did well.

## 2022-01-15 NOTE — Telephone Encounter (Signed)
Wendy Webb states that she is doing fine. She is eating, drinking, and urinating well. She knows to call the office at [phone] if she has any questions or concerns.

## 2022-01-16 ENCOUNTER — Ambulatory Visit
Admission: RE | Admit: 2022-01-16 | Discharge: 2022-01-16 | Disposition: A | Discharge status: Home / Self Care | Payer: BC Managed Care – PPO | Source: Ambulatory Visit | Attending: Hematology and Oncology | Admitting: Hematology and Oncology

## 2022-01-16 DIAGNOSIS — C50411 Malignant neoplasm of upper-outer quadrant of right female breast: Secondary | ICD-10-CM

## 2022-01-16 DIAGNOSIS — Z171 Estrogen receptor negative status [ER-]: Secondary | ICD-10-CM

## 2022-01-18 ENCOUNTER — Telehealth: Payer: Self-pay | Admitting: *Deleted

## 2022-01-18 ENCOUNTER — Encounter: Payer: Self-pay | Admitting: Hematology and Oncology

## 2022-01-18 MED FILL — Dexamethasone Sodium Phosphate Inj 100 MG/10ML: INTRAMUSCULAR | Qty: 1 | Status: AC

## 2022-01-18 NOTE — Telephone Encounter (Addendum)
-----   Message from Benay Pike, MD sent at 01/18/2022 12:48 PM EDT ----- They are saying its slightly smaller, not larger for sure. Can you let her know.  This RN called pt and informed her of above.

## 2022-01-18 NOTE — Telephone Encounter (Signed)
-----   Message from Benay Pike, MD sent at 01/18/2022 12:48 PM EDT ----- They are saying its slightly smaller, not larger for sure. Can you let her know.

## 2022-01-19 ENCOUNTER — Inpatient Hospital Stay: Payer: BC Managed Care – PPO

## 2022-01-19 VITALS — BP 109/66 | HR 83 | Temp 97.8°F | Resp 18 | Wt 152.2 lb

## 2022-01-19 DIAGNOSIS — C50411 Malignant neoplasm of upper-outer quadrant of right female breast: Secondary | ICD-10-CM | POA: Diagnosis not present

## 2022-01-19 DIAGNOSIS — Z171 Estrogen receptor negative status [ER-]: Secondary | ICD-10-CM

## 2022-01-19 DIAGNOSIS — Z95828 Presence of other vascular implants and grafts: Secondary | ICD-10-CM

## 2022-01-19 LAB — CMP (CANCER CENTER ONLY)
ALT: 59 U/L — ABNORMAL HIGH (ref 0–44)
AST: 44 U/L — ABNORMAL HIGH (ref 15–41)
Albumin: 3.9 g/dL (ref 3.5–5.0)
Alkaline Phosphatase: 53 U/L (ref 38–126)
Anion gap: 3 — ABNORMAL LOW (ref 5–15)
BUN: 12 mg/dL (ref 6–20)
CO2: 30 mmol/L (ref 22–32)
Calcium: 8.8 mg/dL — ABNORMAL LOW (ref 8.9–10.3)
Chloride: 105 mmol/L (ref 98–111)
Creatinine: 0.48 mg/dL (ref 0.44–1.00)
GFR, Estimated: >60 mL/min (ref >60)
Glucose, Bld: 97 mg/dL (ref 70–99)
Potassium: 3.8 mmol/L (ref 3.5–5.1)
Sodium: 138 mmol/L (ref 135–145)
Total Bilirubin: 0.3 mg/dL (ref 0.3–1.2)
Total Protein: 6.5 g/dL (ref 6.5–8.1)

## 2022-01-19 LAB — CBC WITH DIFFERENTIAL (CANCER CENTER ONLY)
Abs Immature Granulocytes: 0.02 10*3/uL (ref 0.00–0.07)
Basophils Absolute: 0 10*3/uL (ref 0.0–0.1)
Basophils Relative: 1 %
Eosinophils Absolute: 0 10*3/uL (ref 0.0–0.5)
Eosinophils Relative: 1 %
HCT: 24.8 % — ABNORMAL LOW (ref 36.0–46.0)
Hemoglobin: 8.5 g/dL — ABNORMAL LOW (ref 12.0–15.0)
Immature Granulocytes: 1 %
Lymphocytes Relative: 18 %
Lymphs Abs: 0.6 10*3/uL — ABNORMAL LOW (ref 0.7–4.0)
MCH: 34.3 pg — ABNORMAL HIGH (ref 26.0–34.0)
MCHC: 34.3 g/dL (ref 30.0–36.0)
MCV: 100 fL (ref 80.0–100.0)
Monocytes Absolute: 0.4 10*3/uL (ref 0.1–1.0)
Monocytes Relative: 10 %
Neutro Abs: 2.4 10*3/uL (ref 1.7–7.7)
Neutrophils Relative %: 69 %
Platelet Count: 224 10*3/uL (ref 150–400)
RBC: 2.48 MIL/uL — ABNORMAL LOW (ref 3.87–5.11)
RDW: 15.3 % (ref 11.5–15.5)
WBC Count: 3.5 10*3/uL — ABNORMAL LOW (ref 4.0–10.5)
nRBC: 0 % (ref 0.0–0.2)

## 2022-01-19 MED ORDER — SODIUM CHLORIDE 0.9% FLUSH
10.0000 mL | INTRAVENOUS | Status: DC | PRN
  Administered 2022-01-19: 10 mL

## 2022-01-19 MED ORDER — SODIUM CHLORIDE 0.9 % IV SOLN: Freq: Once | INTRAVENOUS | Status: AC

## 2022-01-19 MED ORDER — PALONOSETRON HCL INJECTION 0.25 MG/5ML
0.2500 mg | Freq: Once | INTRAVENOUS | Status: AC
  Administered 2022-01-19: 0.25 mg via INTRAVENOUS
  Filled 2022-01-19: qty 5

## 2022-01-19 MED ORDER — CETIRIZINE HCL 10 MG/ML IV SOLN
10.0000 mg | Freq: Once | INTRAVENOUS | Status: AC
  Administered 2022-01-19: 10 mg via INTRAVENOUS
  Filled 2022-01-19: qty 1

## 2022-01-19 MED ORDER — SODIUM CHLORIDE 0.9 % IV SOLN
80.0000 mg/m2 | Freq: Once | INTRAVENOUS | Status: AC
  Administered 2022-01-19: 144 mg via INTRAVENOUS
  Filled 2022-01-19: qty 24

## 2022-01-19 MED ORDER — SODIUM CHLORIDE 0.9 % IV SOLN
10.0000 mg | Freq: Once | INTRAVENOUS | Status: AC
  Administered 2022-01-19: 10 mg via INTRAVENOUS
  Filled 2022-01-19: qty 10

## 2022-01-19 MED ORDER — SODIUM CHLORIDE 0.9 % IV SOLN
165.3000 mg | Freq: Once | INTRAVENOUS | Status: AC
  Administered 2022-01-19: 170 mg via INTRAVENOUS
  Filled 2022-01-19: qty 17

## 2022-01-19 MED ORDER — FAMOTIDINE IN NACL 20-0.9 MG/50ML-% IV SOLN
20.0000 mg | Freq: Once | INTRAVENOUS | Status: AC
  Administered 2022-01-19: 20 mg via INTRAVENOUS
  Filled 2022-01-19: qty 50

## 2022-01-19 MED ORDER — HEPARIN SOD (PORK) LOCK FLUSH 100 UNIT/ML IV SOLN
500.0000 [IU] | Freq: Once | INTRAVENOUS | Status: AC | PRN
  Administered 2022-01-19: 500 [IU]

## 2022-01-19 MED ORDER — SODIUM CHLORIDE 0.9% FLUSH
10.0000 mL | Freq: Once | INTRAVENOUS | Status: AC
  Administered 2022-01-19: 10 mL

## 2022-01-19 NOTE — Patient Instructions (Signed)
[facility]COL[facility]  Discharge Instructions: Thank you for choosing [facility] to provide your oncology and hematology care.   If you have a lab appointment with the Cancer Center, please go directly to the Cancer Center and check in at the registration area.   Wear comfortable clothing and clothing appropriate for easy access to any Portacath or PICC line.   We strive to give you quality time with your provider. You may need to reschedule your appointment if you arrive late (15 or more minutes).  Arriving late affects you and other patients whose appointments are after yours.  Also, if you miss three or more appointments without notifying the office, you may be dismissed from the clinic at the provider's discretion.      For prescription refill requests, have your pharmacy contact our office and allow 72 hours for refills to be completed.    Today you received the following chemotherapy and/or immunotherapy agents: Paclitaxel and Carboplatin      To help prevent nausea and vomiting after your treatment, we encourage you to take your nausea medication as directed.  BELOW ARE SYMPTOMS THAT SHOULD BE REPORTED IMMEDIATELY: *FEVER GREATER THAN 100.4 F (38 C) OR HIGHER *CHILLS OR SWEATING *NAUSEA AND VOMITING THAT IS NOT CONTROLLED WITH YOUR NAUSEA MEDICATION *UNUSUAL SHORTNESS OF BREATH *UNUSUAL BRUISING OR BLEEDING *URINARY PROBLEMS (pain or burning when urinating, or frequent urination) *BOWEL PROBLEMS (unusual diarrhea, constipation, pain near the anus) TENDERNESS IN MOUTH AND THROAT WITH OR WITHOUT PRESENCE OF ULCERS (sore throat, sores in mouth, or a toothache) UNUSUAL RASH, SWELLING OR PAIN  UNUSUAL VAGINAL DISCHARGE OR ITCHING   Items with * indicate a potential emergency and should be followed up as soon as possible or go to the Emergency Department if any problems should occur.  Please show the CHEMOTHERAPY ALERT CARD or IMMUNOTHERAPY ALERT  CARD at check-in to the Emergency Department and triage nurse.  Should you have questions after your visit or need to cancel or reschedule your appointment, please contact [facility]  Dept: [phone]  and follow the prompts.  Office hours are 8:00 a.m. to 4:30 p.m. Monday - Friday. Please note that voicemails left after 4:00 p.m. may not be returned until the following business day.  We are closed weekends and major holidays. You have access to a nurse at all times for urgent questions. Please call the main number to the clinic Dept: [phone] and follow the prompts.   For any non-urgent questions, you may also contact your provider using MyChart. We now offer e-Visits for anyone 18 and older to request care online for non-urgent symptoms. For details visit [email or web address].   Also download the MyChart app! Go to the app store, search "MyChart", open the app, select [facility], and log in with your MyChart username and password.  Masks are optional in the cancer centers. If you would like for your care team to wear a mask while they are taking care of you, please let them know. You may have one support person who is at least 55 years old accompany you for your appointments. 

## 2022-01-24 MED FILL — Dexamethasone Sodium Phosphate Inj 100 MG/10ML: INTRAMUSCULAR | Qty: 1 | Status: AC

## 2022-01-25 ENCOUNTER — Encounter: Payer: Self-pay | Admitting: Hematology and Oncology

## 2022-01-25 ENCOUNTER — Other Ambulatory Visit: Payer: Self-pay

## 2022-01-25 ENCOUNTER — Inpatient Hospital Stay: Payer: BC Managed Care – PPO

## 2022-01-25 ENCOUNTER — Inpatient Hospital Stay (HOSPITAL_BASED_OUTPATIENT_CLINIC_OR_DEPARTMENT_OTHER): Payer: BC Managed Care – PPO | Admitting: Hematology and Oncology

## 2022-01-25 VITALS — BP 121/58 | HR 85 | Temp 97.8°F | Resp 16 | Wt 153.4 lb

## 2022-01-25 DIAGNOSIS — C50411 Malignant neoplasm of upper-outer quadrant of right female breast: Secondary | ICD-10-CM | POA: Diagnosis not present

## 2022-01-25 DIAGNOSIS — Z95828 Presence of other vascular implants and grafts: Secondary | ICD-10-CM

## 2022-01-25 DIAGNOSIS — Z171 Estrogen receptor negative status [ER-]: Secondary | ICD-10-CM

## 2022-01-25 LAB — CBC WITH DIFFERENTIAL (CANCER CENTER ONLY)
Abs Immature Granulocytes: 0.01 10*3/uL (ref 0.00–0.07)
Basophils Absolute: 0 10*3/uL (ref 0.0–0.1)
Basophils Relative: 0 %
Eosinophils Absolute: 0 10*3/uL (ref 0.0–0.5)
Eosinophils Relative: 1 %
HCT: 25.4 % — ABNORMAL LOW (ref 36.0–46.0)
Hemoglobin: 8.6 g/dL — ABNORMAL LOW (ref 12.0–15.0)
Immature Granulocytes: 0 %
Lymphocytes Relative: 16 %
Lymphs Abs: 0.4 10*3/uL — ABNORMAL LOW (ref 0.7–4.0)
MCH: 34.5 pg — ABNORMAL HIGH (ref 26.0–34.0)
MCHC: 33.9 g/dL (ref 30.0–36.0)
MCV: 102 fL — ABNORMAL HIGH (ref 80.0–100.0)
Monocytes Absolute: 0.2 10*3/uL (ref 0.1–1.0)
Monocytes Relative: 6 %
Neutro Abs: 2 10*3/uL (ref 1.7–7.7)
Neutrophils Relative %: 77 %
Platelet Count: 164 10*3/uL (ref 150–400)
RBC: 2.49 MIL/uL — ABNORMAL LOW (ref 3.87–5.11)
RDW: 14.6 % (ref 11.5–15.5)
WBC Count: 2.6 10*3/uL — ABNORMAL LOW (ref 4.0–10.5)
nRBC: 0 % (ref 0.0–0.2)

## 2022-01-25 LAB — CMP (CANCER CENTER ONLY)
ALT: 55 U/L — ABNORMAL HIGH (ref 0–44)
AST: 33 U/L (ref 15–41)
Albumin: 3.8 g/dL (ref 3.5–5.0)
Alkaline Phosphatase: 53 U/L (ref 38–126)
Anion gap: 6 (ref 5–15)
BUN: 13 mg/dL (ref 6–20)
CO2: 28 mmol/L (ref 22–32)
Calcium: 8.9 mg/dL (ref 8.9–10.3)
Chloride: 106 mmol/L (ref 98–111)
Creatinine: 0.49 mg/dL (ref 0.44–1.00)
GFR, Estimated: >60 mL/min (ref >60)
Glucose, Bld: 112 mg/dL — ABNORMAL HIGH (ref 70–99)
Potassium: 3.9 mmol/L (ref 3.5–5.1)
Sodium: 140 mmol/L (ref 135–145)
Total Bilirubin: 0.5 mg/dL (ref 0.3–1.2)
Total Protein: 6.2 g/dL — ABNORMAL LOW (ref 6.5–8.1)

## 2022-01-25 MED ORDER — SODIUM CHLORIDE 0.9% FLUSH
10.0000 mL | Freq: Once | INTRAVENOUS | Status: AC
  Administered 2022-01-25: 10 mL

## 2022-01-25 MED ORDER — FAMOTIDINE IN NACL 20-0.9 MG/50ML-% IV SOLN
20.0000 mg | Freq: Once | INTRAVENOUS | Status: AC
  Administered 2022-01-25: 20 mg via INTRAVENOUS
  Filled 2022-01-25: qty 50

## 2022-01-25 MED ORDER — CETIRIZINE HCL 10 MG/ML IV SOLN
10.0000 mg | Freq: Once | INTRAVENOUS | Status: AC
  Administered 2022-01-25: 10 mg via INTRAVENOUS
  Filled 2022-01-25: qty 1

## 2022-01-25 MED ORDER — SODIUM CHLORIDE 0.9 % IV SOLN
165.3000 mg | Freq: Once | INTRAVENOUS | Status: AC
  Administered 2022-01-25: 170 mg via INTRAVENOUS
  Filled 2022-01-25: qty 17

## 2022-01-25 MED ORDER — HEPARIN SOD (PORK) LOCK FLUSH 100 UNIT/ML IV SOLN
500.0000 [IU] | Freq: Once | INTRAVENOUS | Status: AC | PRN
  Administered 2022-01-25: 500 [IU]

## 2022-01-25 MED ORDER — HEPARIN SOD (PORK) LOCK FLUSH 100 UNIT/ML IV SOLN: 500.0000 [IU] | Freq: Once | INTRAVENOUS | Status: DC

## 2022-01-25 MED ORDER — PALONOSETRON HCL INJECTION 0.25 MG/5ML
0.2500 mg | Freq: Once | INTRAVENOUS | Status: AC
  Administered 2022-01-25: 0.25 mg via INTRAVENOUS
  Filled 2022-01-25: qty 5

## 2022-01-25 MED ORDER — SODIUM CHLORIDE 0.9 % IV SOLN
10.0000 mg | Freq: Once | INTRAVENOUS | Status: AC
  Administered 2022-01-25: 10 mg via INTRAVENOUS
  Filled 2022-01-25: qty 10

## 2022-01-25 MED ORDER — SODIUM CHLORIDE 0.9 % IV SOLN
80.0000 mg/m2 | Freq: Once | INTRAVENOUS | Status: AC
  Administered 2022-01-25: 144 mg via INTRAVENOUS
  Filled 2022-01-25: qty 24

## 2022-01-25 MED ORDER — SODIUM CHLORIDE 0.9% FLUSH
10.0000 mL | INTRAVENOUS | Status: DC | PRN
  Administered 2022-01-25: 10 mL

## 2022-01-25 MED ORDER — SODIUM CHLORIDE 0.9 % IV SOLN: Freq: Once | INTRAVENOUS | Status: AC

## 2022-01-25 NOTE — Patient Instructions (Signed)

## 2022-01-25 NOTE — Patient Instructions (Signed)
[facility]COL[facility]  Discharge Instructions: Thank you for choosing [facility] to provide your oncology and hematology care.   If you have a lab appointment with the Cancer Center, please go directly to the Cancer Center and check in at the registration area.   Wear comfortable clothing and clothing appropriate for easy access to any Portacath or PICC line.   We strive to give you quality time with your provider. You may need to reschedule your appointment if you arrive late (15 or more minutes).  Arriving late affects you and other patients whose appointments are after yours.  Also, if you miss three or more appointments without notifying the office, you may be dismissed from the clinic at the provider's discretion.      For prescription refill requests, have your pharmacy contact our office and allow 72 hours for refills to be completed.    Today you received the following chemotherapy and/or immunotherapy agents: Paclitaxel, carboplatin      To help prevent nausea and vomiting after your treatment, we encourage you to take your nausea medication as directed.  BELOW ARE SYMPTOMS THAT SHOULD BE REPORTED IMMEDIATELY: *FEVER GREATER THAN 100.4 F (38 C) OR HIGHER *CHILLS OR SWEATING *NAUSEA AND VOMITING THAT IS NOT CONTROLLED WITH YOUR NAUSEA MEDICATION *UNUSUAL SHORTNESS OF BREATH *UNUSUAL BRUISING OR BLEEDING *URINARY PROBLEMS (pain or burning when urinating, or frequent urination) *BOWEL PROBLEMS (unusual diarrhea, constipation, pain near the anus) TENDERNESS IN MOUTH AND THROAT WITH OR WITHOUT PRESENCE OF ULCERS (sore throat, sores in mouth, or a toothache) UNUSUAL RASH, SWELLING OR PAIN  UNUSUAL VAGINAL DISCHARGE OR ITCHING   Items with * indicate a potential emergency and should be followed up as soon as possible or go to the Emergency Department if any problems should occur.  Please show the CHEMOTHERAPY ALERT CARD or IMMUNOTHERAPY ALERT CARD  at check-in to the Emergency Department and triage nurse.  Should you have questions after your visit or need to cancel or reschedule your appointment, please contact [facility]  Dept: [phone]  and follow the prompts.  Office hours are 8:00 a.m. to 4:30 p.m. Monday - Friday. Please note that voicemails left after 4:00 p.m. may not be returned until the following business day.  We are closed weekends and major holidays. You have access to a nurse at all times for urgent questions. Please call the main number to the clinic Dept: [phone] and follow the prompts.   For any non-urgent questions, you may also contact your provider using MyChart. We now offer e-Visits for anyone 18 and older to request care online for non-urgent symptoms. For details visit [email or web address].   Also download the MyChart app! Go to the app store, search "MyChart", open the app, select [facility], and log in with your MyChart username and password.  Masks are optional in the cancer centers. If you would like for your care team to wear a mask while they are taking care of you, please let them know. You may have one support person who is at least 55 years old accompany you for your appointments. 

## 2022-01-25 NOTE — Progress Notes (Signed)
Wendy Webb PROGRESS NOTE  Patient Care Team: Rosine Door as PCP - General (Physician Assistant) Rockwell Germany, RN as Oncology Nurse Navigator Mauro Kaufmann, RN as Oncology Nurse Navigator Jovita Kussmaul, MD as Consulting Physician (General Surgery) Benay Pike, MD as Consulting Physician (Hematology and Oncology) Kyung Rudd, MD as Consulting Physician (Radiation Oncology)  CHIEF COMPLAINTS:  Breast cancer  SUMMARY OF ONCOLOGIC HISTORY: Oncology History  Malignant neoplasm of upper-outer quadrant of right breast in female, estrogen receptor negative ([place])  09/25/2021 Imaging   Bilateral screening mammogram with irregular hypoechoic mass in the right breast at 12:00 axis, 5 cm from nipple measuring 2.5 cm corresponding to the palpable area of concern. Ultrasound confirmed irregular hypoechoic mass in the right breast at the 12:00 5 cm from the nipple measuring 2.5 cm corresponding to the palpable area of concern.   09/26/2021 Pathology Results   Right breast needle core biopsy showed invasive ductal carcinoma, high-grade prognostic showed ER 0% negative PR 0% negative Ki-67 of 95% and HER2 negative   09/28/2021 Initial Diagnosis   Malignant neoplasm of upper-outer quadrant of right breast in female, estrogen receptor negative ([place])   10/16/2021 Genetic Testing   Negative hereditary cancer genetic testing: no pathogenic variants detected in [facility]Next-Cancer +RNAinsight Panel.  Report date is October 16, 2021.   The CustomNext-Cancer+RNAinsight panel offered by [facility] includes sequencing and rearrangement analysis for the following 47 genes:  APC, ATM, AXIN2, BARD1, BMPR1A, BRCA1, BRCA2, BRIP1, CDH1, CDK4, CDKN2A, CHEK2, DICER1, EPCAM, GREM1, HOXB13, MEN1, MLH1, MSH2, MSH3, MSH6, MUTYH, NBN, NF1, NF2, NTHL1, PALB2, PMS2, POLD1, POLE, PTEN, RAD51C, RAD51D, RECQL, RET, SDHA, SDHAF2, SDHB, SDHC, SDHD, SMAD4, SMARCA4, STK11, TP53, TSC1, TSC2, and  VHL.  RNA data is routinely analyzed for use in variant interpretation for all genes.    10/20/2021 - 12/02/2021 Chemotherapy   Patient is on Treatment Plan : BREAST  Pembrolizumab (200) D1 + AC D1 q21d x 4 cycles / Pembrolizumab (200) D1 + Carboplatin (1.5) D1,8,15 + Paclitaxel (80) D1,8,15 q21d X 4 cycles     10/20/2021 -  Chemotherapy   Patient is on Treatment Plan : BREAST Pembrolizumab (200) D1 + AC D1 q21d x 4 cycles / Pembrolizumab (200) D1 + Carboplatin (1.5) D1,8,15 + Paclitaxel (80) D1,8,15 q21d X 4 cycles      INTERVAL HISTORY:  Wendy Webb returns for follow-up before cycle 5-day 15.  She is now on weekly CarboTaxol with Beryle Flock and has been tolerating it very well.  She still reports some episodes of heart rate spikes noted on her Fitbit although she is clinically asymptomatic.  She denies any other complaints today.  No neuropathy reported.  She was understandably anxious before the ultrasound last week  Since last visit, she thinks the breast mass is better,  Rest of the pertinent 10 point ROS reviewed and negative.  MEDICAL HISTORY:  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[place])    09/26/2021 (right breast biopsy)   Mild mitral regurgitation 06/2019   Palpitations    PONV (postoperative nausea and vomiting)     SURGICAL HISTORY: Past Surgical History:  Procedure Laterality Date   CHOLECYSTECTOMY     GALLBLADDER SURGERY     PORTACATH PLACEMENT Left 10/18/2021   Procedure: PLA CEMENT OF PORT;  Surgeon: Jovita Kussmaul, MD;  Location:  [facility] OR;  Service: General;  Laterality: Left;    SOCIAL HISTORY: Social History   Socioeconomic History   Marital status: Married    Spouse name: Not on file   Number of children: 2   Years of education: Not on file   Highest education level: Not on file  Occupational History    Comment: Hair  dresser  Tobacco Use   Smoking status: Never   Smokeless tobacco: Never  Vaping Use   Vaping Use: Never used  Substance and Sexual Activity   Alcohol use: Yes    Alcohol/week: 0.0 standard drinks of alcohol    Comment: Glass wine per day   Drug use: No   Sexual activity: Yes    Partners: Male  Other Topics Concern   Not on file  Social History Narrative   Pt is a hair stylist   Married (3rd marriage)   2 sons (both grown)   Enjoys yard work, hiking, Scientist, clinical (histocompatibility and immunogenetics)   Complete 2 year cosmetology   Has one Neurosurgeon   Social Determinants of Radio broadcast assistant Strain: Not on Art therapist Insecurity: Not on file  Transportation Needs: Not on file  Physical Activity: Not on file  Stress: Not on file  Social Connections: Not on file  Intimate Partner Violence: Not on file    FAMILY HISTORY: Family History  Problem Relation Age of Onset   Hypertension Mother    Heart attack Mother    Heart disease Mother        Atrial fibrillation   Obstructive Sleep Apnea Mother    Hypertension Brother    Stroke Maternal Grandmother 75   Breast cancer Other 35       PGM's sister   Colon cancer Neg Hx    Esophageal cancer Neg Hx     ALLERGIES:  is allergic to ace inhibitors and shellfish allergy.  MEDICATIONS:  Current Outpatient Medications  Medication Sig Dispense Refill   cholecalciferol (VITAMIN D3) 25 MCG (1000 UNIT) tablet Take 1,000 Units by mouth daily.     cyanocobalamin 1000 MCG tablet Take 1,000 mcg by mouth daily.     dexamethasone (DECADRON) 4 MG tablet Take 1 tablets once a day for 3 days after carboplatin and AC chemotherapy. Take with food. 30 tablet 1   lidocaine-prilocaine (EMLA) cream Apply to affected area once 30 g 3   LORazepam (ATIVAN) 0.5 MG tablet Take 0.25 mg by mouth every morning.     magic mouthwash w/lidocaine SOLN 1:1:1 ratio of lidocaine 2%,maalox,diphenhydramine 12.64m/5ml 5-10 cc swish swallow or spit QID prn 240 mL 2   metoprolol succinate (TOPROL  XL) 25 MG 24 hr tablet Take 1 tablet (25 mg total) by mouth daily. 30 tablet 0   Misc Natural Products (TART CHERRY ADVANCED PO) Take 1 tablet by mouth daily. (Patient not taking: Reported on 01/04/2022)     Multiple Vitamins-Minerals (MULTIVIT/MULTIMINERAL ADULT) LIQD Take 30 mLs by mouth daily.     PREVIDENT 5000 BOOSTER PLUS 1.1 % PSTE Place onto teeth as directed.     prochlorperazine (COMPAZINE) 10 MG tablet Take 1 tablet (10 mg total) by mouth every 6 (six) hours as needed (Nausea or vomiting). (Patient not taking: Reported on 01/04/2022) 30 tablet 1   No current facility-administered medications for this visit.    REVIEW OF SYSTEMS:   Constitutional: Denies fevers, chills or abnormal night sweats Eyes: Denies blurriness of vision, double vision or watery eyes Ears, nose, mouth, throat, and face: Denies mucositis  or sore throat Respiratory: Denies cough, dyspnea or wheezes Cardiovascular: Denies palpitation, chest discomfort or lower extremity swelling Gastrointestinal:  Denies nausea, heartburn or change in bowel habits Skin: Denies abnormal skin rashes Lymphatics: Denies new lymphadenopathy or easy bruising Neurological:Denies numbness, tingling or new weaknesses Behavioral/Psych: Mood is stable, no new changes    PHYSICAL EXAMINATION: ECOG PERFORMANCE STATUS: 0 - Asymptomatic  Vitals:   01/25/22 0825  BP: (!) 121/58  Pulse: 85  Resp: 16  Temp: 97.8 F (36.6 C)  SpO2: 100%   Filed Weights   01/25/22 0825  Weight: 153 lb 6 oz (69.6 kg)    Physical Exam Chest:       Comments: 2-1/2 in horizontal dimension and about 2 cm in vertical dimension right breast mass at 12 0 clock, No clear evidence of axillary adenopathy, some abnormal density right axilla.  No significant change in the breast mass, ? Mild increase?   LABORATORY DATA:  I have reviewed the data as listed Lab Results  Component Value Date   WBC 2.6 (L) 01/25/2022   HGB 8.6 (L) 01/25/2022   HCT 25.4  (L) 01/25/2022   MCV 102.0 (H) 01/25/2022   PLT 164 01/25/2022   Lab Results  Component Value Date   NA 140 01/25/2022   K 3.9 01/25/2022   CL 106 01/25/2022   CO2 28 01/25/2022    RADIOGRAPHIC STUDIES: I have personally reviewed the radiological reports and agreed with the findings in the report.  ASSESSMENT AND PLAN:  Wendy Webb is a 56 y.o. 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  She receives G-CSF injection on day 3 of each cycle. -- Patient presents today for cycle 5-day 15 of weekly CarboTaxol and immunotherapy.  Breast exam today, mild improvement noted in the right breast mass at 12 o'clock position.  No palpable axillary lymphadenopathy.  We have confirmed this with an ultrasound as well recently.  She has no dose-limiting toxicity. -- Liver function tests improved overall.  Okay to proceed with treatment as planned  #Chemo therapy induced anemia, no indication for transfusion.  We will continue to monitor  #Chemotherapy-induced alopecia   I have spent a total of 30 minutes minutes of face-to-face and non-face-to-face time, preparing to see the patient,  performing a medically appropriate examination, counseling and educating the patient, documenting clinical information in the electronic health record,and care coordination.   Benay Pike MD

## 2022-01-26 ENCOUNTER — Inpatient Hospital Stay: Payer: BC Managed Care – PPO

## 2022-01-26 VITALS — BP 128/58 | HR 78 | Temp 98.2°F | Resp 18

## 2022-01-26 DIAGNOSIS — Z171 Estrogen receptor negative status [ER-]: Secondary | ICD-10-CM

## 2022-01-26 DIAGNOSIS — C50411 Malignant neoplasm of upper-outer quadrant of right female breast: Secondary | ICD-10-CM | POA: Diagnosis not present

## 2022-01-26 MED ORDER — FILGRASTIM-AAFI 300 MCG/0.5ML IJ SOSY
300.0000 ug | PREFILLED_SYRINGE | Freq: Once | INTRAMUSCULAR | Status: AC
  Administered 2022-01-26: 300 ug via SUBCUTANEOUS
  Filled 2022-01-26: qty 0.5

## 2022-01-27 ENCOUNTER — Inpatient Hospital Stay: Payer: BC Managed Care – PPO

## 2022-01-27 VITALS — BP 133/73 | HR 77 | Temp 97.5°F

## 2022-01-27 DIAGNOSIS — C50411 Malignant neoplasm of upper-outer quadrant of right female breast: Secondary | ICD-10-CM

## 2022-01-27 MED ORDER — FILGRASTIM-AAFI 300 MCG/0.5ML IJ SOSY
300.0000 ug | PREFILLED_SYRINGE | Freq: Once | INTRAMUSCULAR | Status: AC
  Administered 2022-01-27: 300 ug via SUBCUTANEOUS

## 2022-01-27 NOTE — Patient Instructions (Signed)

## 2022-01-29 ENCOUNTER — Inpatient Hospital Stay: Payer: BC Managed Care – PPO

## 2022-01-29 VITALS — BP 132/68 | HR 72 | Temp 98.2°F | Resp 17

## 2022-01-29 DIAGNOSIS — C50411 Malignant neoplasm of upper-outer quadrant of right female breast: Secondary | ICD-10-CM | POA: Diagnosis not present

## 2022-01-29 MED ORDER — FILGRASTIM-AAFI 300 MCG/0.5ML IJ SOSY
300.0000 ug | PREFILLED_SYRINGE | Freq: Once | INTRAMUSCULAR | Status: AC
  Administered 2022-01-29: 300 ug via SUBCUTANEOUS
  Filled 2022-01-29: qty 0.5

## 2022-01-31 MED FILL — Dexamethasone Sodium Phosphate Inj 100 MG/10ML: INTRAMUSCULAR | Qty: 1 | Status: AC

## 2022-02-01 ENCOUNTER — Inpatient Hospital Stay (HOSPITAL_BASED_OUTPATIENT_CLINIC_OR_DEPARTMENT_OTHER): Payer: BC Managed Care – PPO | Admitting: Hematology and Oncology

## 2022-02-01 ENCOUNTER — Inpatient Hospital Stay: Payer: BC Managed Care – PPO

## 2022-02-01 ENCOUNTER — Encounter: Payer: Self-pay | Admitting: Hematology and Oncology

## 2022-02-01 DIAGNOSIS — Z171 Estrogen receptor negative status [ER-]: Secondary | ICD-10-CM

## 2022-02-01 DIAGNOSIS — C50411 Malignant neoplasm of upper-outer quadrant of right female breast: Secondary | ICD-10-CM

## 2022-02-01 DIAGNOSIS — Z95828 Presence of other vascular implants and grafts: Secondary | ICD-10-CM

## 2022-02-01 LAB — CBC WITH DIFFERENTIAL (CANCER CENTER ONLY)
Abs Immature Granulocytes: 0.13 10*3/uL — ABNORMAL HIGH (ref 0.00–0.07)
Basophils Absolute: 0 10*3/uL (ref 0.0–0.1)
Basophils Relative: 1 %
Eosinophils Absolute: 0 10*3/uL (ref 0.0–0.5)
Eosinophils Relative: 1 %
HCT: 26.6 % — ABNORMAL LOW (ref 36.0–46.0)
Hemoglobin: 9.1 g/dL — ABNORMAL LOW (ref 12.0–15.0)
Immature Granulocytes: 3 %
Lymphocytes Relative: 21 %
Lymphs Abs: 0.8 10*3/uL (ref 0.7–4.0)
MCH: 35.7 pg — ABNORMAL HIGH (ref 26.0–34.0)
MCHC: 34.2 g/dL (ref 30.0–36.0)
MCV: 104.3 fL — ABNORMAL HIGH (ref 80.0–100.0)
Monocytes Absolute: 0.6 10*3/uL (ref 0.1–1.0)
Monocytes Relative: 16 %
Neutro Abs: 2.2 10*3/uL (ref 1.7–7.7)
Neutrophils Relative %: 58 %
Platelet Count: 234 10*3/uL (ref 150–400)
RBC: 2.55 MIL/uL — ABNORMAL LOW (ref 3.87–5.11)
RDW: 15.7 % — ABNORMAL HIGH (ref 11.5–15.5)
WBC Count: 3.8 10*3/uL — ABNORMAL LOW (ref 4.0–10.5)
nRBC: 0.5 % — ABNORMAL HIGH (ref 0.0–0.2)

## 2022-02-01 LAB — CMP (CANCER CENTER ONLY)
ALT: 74 U/L — ABNORMAL HIGH (ref 0–44)
AST: 45 U/L — ABNORMAL HIGH (ref 15–41)
Albumin: 3.9 g/dL (ref 3.5–5.0)
Alkaline Phosphatase: 73 U/L (ref 38–126)
Anion gap: 4 — ABNORMAL LOW (ref 5–15)
BUN: 12 mg/dL (ref 6–20)
CO2: 30 mmol/L (ref 22–32)
Calcium: 9 mg/dL (ref 8.9–10.3)
Chloride: 107 mmol/L (ref 98–111)
Creatinine: 0.5 mg/dL (ref 0.44–1.00)
GFR, Estimated: >60 mL/min (ref >60)
Glucose, Bld: 104 mg/dL — ABNORMAL HIGH (ref 70–99)
Potassium: 3.9 mmol/L (ref 3.5–5.1)
Sodium: 141 mmol/L (ref 135–145)
Total Bilirubin: 0.3 mg/dL (ref 0.3–1.2)
Total Protein: 6.4 g/dL — ABNORMAL LOW (ref 6.5–8.1)

## 2022-02-01 LAB — TSH: TSH: 2.702 u[IU]/mL (ref 0.350–4.500)

## 2022-02-01 MED ORDER — HEPARIN SOD (PORK) LOCK FLUSH 100 UNIT/ML IV SOLN
500.0000 [IU] | Freq: Once | INTRAVENOUS | Status: AC | PRN
  Administered 2022-02-01: 500 [IU]

## 2022-02-01 MED ORDER — SODIUM CHLORIDE 0.9 % IV SOLN
10.0000 mg | Freq: Once | INTRAVENOUS | Status: AC
  Administered 2022-02-01: 10 mg via INTRAVENOUS
  Filled 2022-02-01: qty 10

## 2022-02-01 MED ORDER — FAMOTIDINE IN NACL 20-0.9 MG/50ML-% IV SOLN
20.0000 mg | Freq: Once | INTRAVENOUS | Status: AC
  Administered 2022-02-01: 20 mg via INTRAVENOUS
  Filled 2022-02-01: qty 50

## 2022-02-01 MED ORDER — SODIUM CHLORIDE 0.9 % IV SOLN
165.3000 mg | Freq: Once | INTRAVENOUS | Status: AC
  Administered 2022-02-01: 170 mg via INTRAVENOUS
  Filled 2022-02-01: qty 17

## 2022-02-01 MED ORDER — PALONOSETRON HCL INJECTION 0.25 MG/5ML
0.2500 mg | Freq: Once | INTRAVENOUS | Status: AC
  Administered 2022-02-01: 0.25 mg via INTRAVENOUS
  Filled 2022-02-01: qty 5

## 2022-02-01 MED ORDER — SODIUM CHLORIDE 0.9% FLUSH
10.0000 mL | Freq: Once | INTRAVENOUS | Status: AC
  Administered 2022-02-01: 10 mL

## 2022-02-01 MED ORDER — SODIUM CHLORIDE 0.9 % IV SOLN
200.0000 mg | Freq: Once | INTRAVENOUS | Status: AC
  Administered 2022-02-01: 200 mg via INTRAVENOUS
  Filled 2022-02-01: qty 200

## 2022-02-01 MED ORDER — SODIUM CHLORIDE 0.9 % IV SOLN
80.0000 mg/m2 | Freq: Once | INTRAVENOUS | Status: AC
  Administered 2022-02-01: 144 mg via INTRAVENOUS
  Filled 2022-02-01: qty 24

## 2022-02-01 MED ORDER — CETIRIZINE HCL 10 MG/ML IV SOLN
10.0000 mg | Freq: Once | INTRAVENOUS | Status: AC
  Administered 2022-02-01: 10 mg via INTRAVENOUS
  Filled 2022-02-01: qty 1

## 2022-02-01 MED ORDER — SODIUM CHLORIDE 0.9 % IV SOLN: Freq: Once | INTRAVENOUS | Status: AC

## 2022-02-01 MED ORDER — SODIUM CHLORIDE 0.9% FLUSH
10.0000 mL | INTRAVENOUS | Status: DC | PRN
  Administered 2022-02-01: 10 mL

## 2022-02-01 NOTE — Patient Instructions (Signed)
[facility] ONCOLOG[facility]  Discharge Instructions: Thank you for choosing [facility] to provide your oncology and hematology care.   If you have a lab appointment with the [facility], please go directly to the [facility] and check in at the registration area.   Wear comfortable clothing and clothing appropriate for easy access to any Portacath or PICC line.   We strive to give you quality time with your provider. You may need to reschedule your appointment if you arrive late (15 or more minutes).  Arriving late affects you and other patients whose appointments are after yours.  Also, if you miss three or more appointments without notifying the office, you may be dismissed from the clinic at the provider's discretion.      For prescription refill requests, have your pharmacy contact our office and allow 72 hours for refills to be completed.    Today you received the following chemotherapy and/or immunotherapy agents: Keytruda, Paclitaxel and Carboplatin      To help prevent nausea and vomiting after your treatment, we encourage you to take your nausea medication as directed.  BELOW ARE SYMPTOMS THAT SHOULD BE REPORTED IMMEDIATELY: *FEVER GREATER THAN 100.4 F (38 C) OR HIGHER *CHILLS OR SWEATING *NAUSEA AND VOMITING THAT IS NOT CONTROLLED WITH YOUR NAUSEA MEDICATION *UNUSUAL SHORTNESS OF BREATH *UNUSUAL BRUISING OR BLEEDING *URINARY PROBLEMS (pain or burning when urinating, or frequent urination) *BOWEL PROBLEMS (unusual diarrhea, constipation, pain near the anus) TENDERNESS IN MOUTH AND THROAT WITH OR WITHOUT PRESENCE OF ULCERS (sore throat, sores in mouth, or a toothache) UNUSUAL RASH, SWELLING OR PAIN  UNUSUAL VAGINAL DISCHARGE OR ITCHING   Items with * indicate a potential emergency and should be followed up as soon as possible or go to the Emergency Department if any problems should occur.  Please show the CHEMOTHERAPY ALERT CARD or IMMUNOTHERAPY  ALERT CARD at check-in to the Emergency Department and triage nurse.  Should you have questions after your visit or need to cancel or reschedule your appointment, please contact [facility]  Dept: [phone]  and follow the prompts.  Office hours are 8:00 a.m. to 4:30 p.m. Monday - Friday. Please note that voicemails left after 4:00 p.m. may not be returned until the following business day.  We are closed weekends and major holidays. You have access to a nurse at all times for urgent questions. Please call the main number to the clinic Dept: [phone] and follow the prompts.   For any non-urgent questions, you may also contact your provider using MyChart. We now offer e-Visits for anyone 32 and older to request care online for non-urgent symptoms. For details visit [email or web address].   Also download the MyChart app! Go to the app store, search "MyChart", open the app, select [facility], and log in with your MyChart username and password.  Masks are optional in the cancer centers. If you would like for your care team to wear a mask while they are taking care of you, please let them know. You may have one support person who is at least 55 years old accompany you for your appointments.

## 2022-02-01 NOTE — Progress Notes (Signed)
[name] PROGRESS NOTE  Patient Care Team: Rosine Door as PCP - General (Physician Assistant) Rockwell Germany, RN as Oncology Nurse Navigator Mauro Kaufmann, RN as Oncology Nurse Navigator Jovita Kussmaul, MD as Consulting Physician (General Surgery) Benay Pike, MD as Consulting Physician (Hematology and Oncology) Kyung Rudd, MD as Consulting Physician (Radiation Oncology)  CHIEF COMPLAINTS:  Breast cancer  SUMMARY OF ONCOLOGIC HISTORY: Oncology History  Malignant neoplasm of upper-outer quadrant of right breast in female, estrogen receptor negative ([place])  09/25/2021 Imaging   Bilateral screening mammogram with irregular hypoechoic mass in the right breast at 12:00 axis, 5 cm from nipple measuring 2.5 cm corresponding to the palpable area of concern. Ultrasound confirmed irregular hypoechoic mass in the right breast at the 12:00 5 cm from the nipple measuring 2.5 cm corresponding to the palpable area of concern.   09/26/2021 Pathology Results   Right breast needle core biopsy showed invasive ductal carcinoma, high-grade prognostic showed ER 0% negative PR 0% negative Ki-67 of 95% and HER2 negative   09/28/2021 Initial Diagnosis   Malignant neoplasm of upper-outer quadrant of right breast in female, estrogen receptor negative ([place])   10/16/2021 Genetic Testing   Negative hereditary cancer genetic testing: no pathogenic variants detected in [facility] CustomNext-Cancer +RNAinsight Panel.  Report date is October 16, 2021.   The CustomNext-Cancer+RNAinsight panel offered by [facility] includes sequencing and rearrangement analysis for the following 47 genes:  APC, ATM, AXIN2, BARD1, BMPR1A, BRCA1, BRCA2, BRIP1, CDH1, CDK4, CDKN2A, CHEK2, DICER1, EPCAM, GREM1, HOXB13, MEN1, MLH1, MSH2, MSH3, MSH6, MUTYH, NBN, NF1, NF2, NTHL1, PALB2, PMS2, POLD1, POLE, PTEN, RAD51C, RAD51D, RECQL, RET, SDHA, SDHAF2, SDHB, SDHC, SDHD, SMAD4, SMARCA4, STK11, TP53, TSC1, TSC2, and  VHL.  RNA data is routinely analyzed for use in variant interpretation for all genes.    10/20/2021 - 12/02/2021 Chemotherapy   Patient is on Treatment Plan : BREAST  Pembrolizumab (200) D1 + AC D1 q21d x 4 cycles / Pembrolizumab (200) D1 + Carboplatin (1.5) D1,8,15 + Paclitaxel (80) D1,8,15 q21d X 4 cycles     10/20/2021 -  Chemotherapy   Patient is on Treatment Plan : BREAST Pembrolizumab (200) D1 + AC D1 q21d x 4 cycles / Pembrolizumab (200) D1 + Carboplatin (1.5) D1,8,15 + Paclitaxel (80) D1,8,15 q21d X 4 cycles      INTERVAL HISTORY:  Wendy Webb returns for follow-up before cycle 5-day 15.  She is now on weekly CarboTaxol with Beryle Flock and has been tolerating it very well.  Since last visit, she thinks the breast mass is better, she tells me that she cannot really feel it anymore.  Except for Neulasta induced bone pain and very mild intermittent neuropathy, she denies any complaints at all.  She is very encouraged by the response with the taxane and carboplatin combination.  Rest of the pertinent 10 point ROS reviewed and negative.  MEDICAL HISTORY:  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[place])    09/26/2021 (right breast biopsy)   Mild mitral regurgitation 06/2019   Palpitations    PONV (postoperative nausea and vomiting)     SURGICAL HISTORY: Past Surgical History:  Procedure Laterality Date   CHOLECYSTECTOMY     GALLBLADDER SURGERY     PORTACATH PLACEMENT Left 10/18/2021   Procedure: PLA CEMENT OF PORT;  Surgeon: Jovita Kussmaul, MD;  Location: [facility] OR;  Service: General;  Laterality: Left;    SOCIAL HISTORY: Social History   Socioeconomic History   Marital status: Married    Spouse name: Not on file   Number of children: 2   Years of education: Not on file   Highest education level: Not on file  Occupational History    Comment: Hair  dresser  Tobacco Use   Smoking status: Never   Smokeless tobacco: Never  Vaping Use   Vaping Use: Never used  Substance and Sexual Activity   Alcohol use: Yes    Alcohol/week: 0.0 standard drinks of alcohol    Comment: Glass wine per day   Drug use: No   Sexual activity: Yes    Partners: Male  Other Topics Concern   Not on file  Social History Narrative   Pt is a hair stylist   Married (3rd marriage)   2 sons (both grown)   Enjoys yard work, hiking, Scientist, clinical (histocompatibility and immunogenetics)   Complete 2 year cosmetology   Has one Neurosurgeon   Social Determinants of Radio broadcast assistant Strain: Not on Art therapist Insecurity: Not on file  Transportation Needs: Not on file  Physical Activity: Not on file  Stress: Not on file  Social Connections: Not on file  Intimate Partner Violence: Not on file    FAMILY HISTORY: Family History  Problem Relation Age of Onset   Hypertension Mother    Heart attack Mother    Heart disease Mother        Atrial fibrillation   Obstructive Sleep Apnea Mother    Hypertension Brother    Stroke Maternal Grandmother 75   Breast cancer Other 84       PGM's sister   Colon cancer Neg Hx    Esophageal cancer Neg Hx     ALLERGIES:  is allergic to ace inhibitors and shellfish allergy.  MEDICATIONS:  Current Outpatient Medications  Medication Sig Dispense Refill   cholecalciferol (VITAMIN D3) 25 MCG (1000 UNIT) tablet Take 1,000 Units by mouth daily.     cyanocobalamin 1000 MCG tablet Take 1,000 mcg by mouth daily.     dexamethasone (DECADRON) 4 MG tablet Take 1 tablets once a day for 3 days after carboplatin and AC chemotherapy. Take with food. 30 tablet 1   lidocaine-prilocaine (EMLA) cream Apply to affected area once 30 g 3   LORazepam (ATIVAN) 0.5 MG tablet Take 0.25 mg by mouth every morning.     magic mouthwash w/lidocaine SOLN 1:1:1 ratio of lidocaine 2%,maalox,diphenhydramine 12.10m/5ml 5-10 cc swish swallow or spit QID prn 240 mL 2   metoprolol succinate (TOPROL  XL) 25 MG 24 hr tablet Take 1 tablet (25 mg total) by mouth daily. 30 tablet 0   Misc Natural Products (TART CHERRY ADVANCED PO) Take 1 tablet by mouth daily. (Patient not taking: Reported on 01/04/2022)     Multiple Vitamins-Minerals (MULTIVIT/MULTIMINERAL ADULT) LIQD Take 30 mLs by mouth daily.     PREVIDENT 5000 BOOSTER PLUS 1.1 % PSTE Place onto teeth as directed.     prochlorperazine (COMPAZINE) 10 MG tablet Take 1 tablet (10 mg total) by mouth every 6 (six) hours as needed (Nausea or vomiting). (Patient not taking: Reported on 01/04/2022) 30 tablet 1   No current facility-administered medications for this visit.    REVIEW OF SYSTEMS:   Constitutional: Denies fevers, chills or abnormal night sweats Eyes: Denies blurriness of vision, double vision or watery eyes Ears, nose, mouth, throat, and face: Denies  mucositis or sore throat Respiratory: Denies cough, dyspnea or wheezes Cardiovascular: Denies palpitation, chest discomfort or lower extremity swelling Gastrointestinal:  Denies nausea, heartburn or change in bowel habits Skin: Denies abnormal skin rashes Lymphatics: Denies new lymphadenopathy or easy bruising Neurological:Denies numbness, tingling or new weaknesses Behavioral/Psych: Mood is stable, no new changes    PHYSICAL EXAMINATION: ECOG PERFORMANCE STATUS: 0 - Asymptomatic  Vitals:   02/01/22 1144  BP: 119/61  Pulse: 85  Resp: 16  Temp: (!) 97.5 F (36.4 C)  SpO2: 100%   Filed Weights   02/01/22 1144  Weight: 154 lb 4.8 oz (70 kg)    Physical Exam Constitutional:      Appearance: Normal appearance.  Chest:       Comments: The masses definitely more ill-defined with improvement since last visit.  This is consistent with response.  No palpable regional adenopathy.  Port site appears well Musculoskeletal:        General: No swelling.     Cervical back: Normal range of motion and neck supple. No rigidity.  Neurological:     Mental Status: She is alert.       LABORATORY DATA:  I have reviewed the data as listed Lab Results  Component Value Date   WBC 3.8 (L) 02/01/2022   HGB 9.1 (L) 02/01/2022   HCT 26.6 (L) 02/01/2022   MCV 104.3 (H) 02/01/2022   PLT 234 02/01/2022   Lab Results  Component Value Date   NA 140 01/25/2022   K 3.9 01/25/2022   CL 106 01/25/2022   CO2 28 01/25/2022    RADIOGRAPHIC STUDIES: I have personally reviewed the radiological reports and agreed with the findings in the report.  ASSESSMENT AND PLAN:  Wendy Webb is a 56 y.o. 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  We will continue to monitor  #Chemotherapy-induced alopecia  #Grade 1 peripheral neuropathy, no dose adjustment needed at this time  #G-CSF induced bone pain, mild, will continue to monitor  #Transaminitis, likely from taxane, no dose adjustment needed.  We will continue to follow it.  I have spent a total of 30 minutes minutes of face-to-face and non-face-to-face time, preparing to see the patient,  performing a medically appropriate examination, counseling and educating the patient, documenting clinical information in the electronic health record,and care coordination.   Benay Pike MD

## 2022-02-07 MED FILL — Dexamethasone Sodium Phosphate Inj 100 MG/10ML: INTRAMUSCULAR | Qty: 1 | Status: AC

## 2022-02-08 ENCOUNTER — Inpatient Hospital Stay: Payer: BC Managed Care – PPO | Attending: Hematology and Oncology

## 2022-02-08 ENCOUNTER — Inpatient Hospital Stay: Payer: BC Managed Care – PPO

## 2022-02-08 VITALS — BP 109/66 | HR 86 | Temp 98.3°F | Resp 18 | Wt 155.0 lb

## 2022-02-08 DIAGNOSIS — K589 Irritable bowel syndrome without diarrhea: Secondary | ICD-10-CM | POA: Diagnosis not present

## 2022-02-08 DIAGNOSIS — G629 Polyneuropathy, unspecified: Secondary | ICD-10-CM | POA: Insufficient documentation

## 2022-02-08 DIAGNOSIS — Z800 Family history of malignant neoplasm of digestive organs: Secondary | ICD-10-CM | POA: Insufficient documentation

## 2022-02-08 DIAGNOSIS — Z79899 Other long term (current) drug therapy: Secondary | ICD-10-CM | POA: Diagnosis not present

## 2022-02-08 DIAGNOSIS — Z95828 Presence of other vascular implants and grafts: Secondary | ICD-10-CM

## 2022-02-08 DIAGNOSIS — C50411 Malignant neoplasm of upper-outer quadrant of right female breast: Secondary | ICD-10-CM | POA: Diagnosis present

## 2022-02-08 DIAGNOSIS — Z803 Family history of malignant neoplasm of breast: Secondary | ICD-10-CM | POA: Insufficient documentation

## 2022-02-08 DIAGNOSIS — Z5111 Encounter for antineoplastic chemotherapy: Secondary | ICD-10-CM | POA: Diagnosis not present

## 2022-02-08 DIAGNOSIS — Z171 Estrogen receptor negative status [ER-]: Secondary | ICD-10-CM | POA: Insufficient documentation

## 2022-02-08 DIAGNOSIS — I10 Essential (primary) hypertension: Secondary | ICD-10-CM | POA: Insufficient documentation

## 2022-02-08 DIAGNOSIS — R7401 Elevation of levels of liver transaminase levels: Secondary | ICD-10-CM | POA: Insufficient documentation

## 2022-02-08 LAB — CMP (CANCER CENTER ONLY)
ALT: 51 U/L — ABNORMAL HIGH (ref 0–44)
AST: 35 U/L (ref 15–41)
Albumin: 3.9 g/dL (ref 3.5–5.0)
Alkaline Phosphatase: 52 U/L (ref 38–126)
Anion gap: 5 (ref 5–15)
BUN: 11 mg/dL (ref 6–20)
CO2: 27 mmol/L (ref 22–32)
Calcium: 8.8 mg/dL — ABNORMAL LOW (ref 8.9–10.3)
Chloride: 106 mmol/L (ref 98–111)
Creatinine: 0.48 mg/dL (ref 0.44–1.00)
GFR, Estimated: >60 mL/min (ref >60)
Glucose, Bld: 89 mg/dL (ref 70–99)
Potassium: 3.8 mmol/L (ref 3.5–5.1)
Sodium: 138 mmol/L (ref 135–145)
Total Bilirubin: 0.3 mg/dL (ref 0.3–1.2)
Total Protein: 6.6 g/dL (ref 6.5–8.1)

## 2022-02-08 LAB — T4, FREE: Free T4: 0.78 ng/dL (ref 0.61–1.12)

## 2022-02-08 LAB — CBC WITH DIFFERENTIAL (CANCER CENTER ONLY)
Abs Immature Granulocytes: 0.01 10*3/uL (ref 0.00–0.07)
Basophils Absolute: 0 10*3/uL (ref 0.0–0.1)
Basophils Relative: 1 %
Eosinophils Absolute: 0 10*3/uL (ref 0.0–0.5)
Eosinophils Relative: 0 %
HCT: 25.1 % — ABNORMAL LOW (ref 36.0–46.0)
Hemoglobin: 8.5 g/dL — ABNORMAL LOW (ref 12.0–15.0)
Immature Granulocytes: 0 %
Lymphocytes Relative: 22 %
Lymphs Abs: 0.6 10*3/uL — ABNORMAL LOW (ref 0.7–4.0)
MCH: 35.1 pg — ABNORMAL HIGH (ref 26.0–34.0)
MCHC: 33.9 g/dL (ref 30.0–36.0)
MCV: 103.7 fL — ABNORMAL HIGH (ref 80.0–100.0)
Monocytes Absolute: 0.2 10*3/uL (ref 0.1–1.0)
Monocytes Relative: 7 %
Neutro Abs: 2 10*3/uL (ref 1.7–7.7)
Neutrophils Relative %: 70 %
Platelet Count: 174 10*3/uL (ref 150–400)
RBC: 2.42 MIL/uL — ABNORMAL LOW (ref 3.87–5.11)
RDW: 14.5 % (ref 11.5–15.5)
WBC Count: 2.8 10*3/uL — ABNORMAL LOW (ref 4.0–10.5)
nRBC: 0 % (ref 0.0–0.2)

## 2022-02-08 LAB — TSH: TSH: 2.017 u[IU]/mL (ref 0.350–4.500)

## 2022-02-08 MED ORDER — CETIRIZINE HCL 10 MG/ML IV SOLN
10.0000 mg | Freq: Once | INTRAVENOUS | Status: AC
  Administered 2022-02-08: 10 mg via INTRAVENOUS

## 2022-02-08 MED ORDER — SODIUM CHLORIDE 0.9 % IV SOLN
80.0000 mg/m2 | Freq: Once | INTRAVENOUS | Status: AC
  Administered 2022-02-08: 144 mg via INTRAVENOUS
  Filled 2022-02-08: qty 24

## 2022-02-08 MED ORDER — SODIUM CHLORIDE 0.9% FLUSH
10.0000 mL | Freq: Once | INTRAVENOUS | Status: AC
  Administered 2022-02-08: 10 mL

## 2022-02-08 MED ORDER — SODIUM CHLORIDE 0.9 % IV SOLN: Freq: Once | INTRAVENOUS | Status: AC

## 2022-02-08 MED ORDER — PALONOSETRON HCL INJECTION 0.25 MG/5ML
0.2500 mg | Freq: Once | INTRAVENOUS | Status: AC
  Administered 2022-02-08: 0.25 mg via INTRAVENOUS
  Filled 2022-02-08: qty 5

## 2022-02-08 MED ORDER — SODIUM CHLORIDE 0.9 % IV SOLN
10.0000 mg | Freq: Once | INTRAVENOUS | Status: AC
  Administered 2022-02-08: 10 mg via INTRAVENOUS
  Filled 2022-02-08: qty 10

## 2022-02-08 MED ORDER — SODIUM CHLORIDE 0.9 % IV SOLN
165.3000 mg | Freq: Once | INTRAVENOUS | Status: AC
  Administered 2022-02-08: 170 mg via INTRAVENOUS
  Filled 2022-02-08: qty 17

## 2022-02-08 MED ORDER — FAMOTIDINE IN NACL 20-0.9 MG/50ML-% IV SOLN
20.0000 mg | Freq: Once | INTRAVENOUS | Status: AC
  Administered 2022-02-08: 20 mg via INTRAVENOUS
  Filled 2022-02-08: qty 50

## 2022-02-08 NOTE — Patient Instructions (Signed)
[facility]  Discharge Instructions: Thank you for choosing [facility] to provide your oncology and hematology care.   If you have a lab appointment with the Cancer Center, please go directly to the Cancer Center and check in at the registration area.   Wear comfortable clothing and clothing appropriate for easy access to any Portacath or PICC line.   We strive to give you quality time with your provider. You may need to reschedule your appointment if you arrive late (15 or more minutes).  Arriving late affects you and other patients whose appointments are after yours.  Also, if you miss three or more appointments without notifying the office, you may be dismissed from the clinic at the provider's discretion.      For prescription refill requests, have your pharmacy contact our office and allow 72 hours for refills to be completed.    Today you received the following chemotherapy and/or immunotherapy agents paclitaxel, carboplatin      To help prevent nausea and vomiting after your treatment, we encourage you to take your nausea medication as directed.  BELOW ARE SYMPTOMS THAT SHOULD BE REPORTED IMMEDIATELY: *FEVER GREATER THAN 100.4 F (38 C) OR HIGHER *CHILLS OR SWEATING *NAUSEA AND VOMITING THAT IS NOT CONTROLLED WITH YOUR NAUSEA MEDICATION *UNUSUAL SHORTNESS OF BREATH *UNUSUAL BRUISING OR BLEEDING *URINARY PROBLEMS (pain or burning when urinating, or frequent urination) *BOWEL PROBLEMS (unusual diarrhea, constipation, pain near the anus) TENDERNESS IN MOUTH AND THROAT WITH OR WITHOUT PRESENCE OF ULCERS (sore throat, sores in mouth, or a toothache) UNUSUAL RASH, SWELLING OR PAIN  UNUSUAL VAGINAL DISCHARGE OR ITCHING   Items with * indicate a potential emergency and should be followed up as soon as possible or go to the Emergency Department if any problems should occur.  Please show the CHEMOTHERAPY ALERT CARD or IMMUNOTHERAPY ALERT CARD at  check-in to the Emergency Department and triage nurse.  Should you have questions after your visit or need to cancel or reschedule your appointment, please contact [facility]  Dept: [phone]  and follow the prompts.  Office hours are 8:00 a.m. to 4:30 p.m. Monday - Friday. Please note that voicemails left after 4:00 p.m. may not be returned until the following business day.  We are closed weekends and major holidays. You have access to a nurse at all times for urgent questions. Please call the main number to the clinic Dept: [phone] and follow the prompts.   For any non-urgent questions, you may also contact your provider using MyChart. We now offer e-Visits for anyone 18 and older to request care online for non-urgent symptoms. For details visit [email or web address].   Also download the MyChart app! Go to the app store, search "MyChart", open the app, select [facility], and log in with your MyChart username and password.  Masks are optional in the cancer centers. If you would like for your care team to wear a mask while they are taking care of you, please let them know. You may have one support person who is at least 55 years old accompany you for your appointments. 

## 2022-02-14 ENCOUNTER — Encounter: Payer: Self-pay | Admitting: *Deleted

## 2022-02-14 MED FILL — Dexamethasone Sodium Phosphate Inj 100 MG/10ML: INTRAMUSCULAR | Qty: 1 | Status: AC

## 2022-02-15 ENCOUNTER — Inpatient Hospital Stay: Payer: BC Managed Care – PPO

## 2022-02-15 VITALS — BP 117/78 | HR 80 | Temp 97.8°F | Resp 18 | Ht 67.0 in | Wt 153.8 lb

## 2022-02-15 DIAGNOSIS — C50411 Malignant neoplasm of upper-outer quadrant of right female breast: Secondary | ICD-10-CM

## 2022-02-15 DIAGNOSIS — Z171 Estrogen receptor negative status [ER-]: Secondary | ICD-10-CM

## 2022-02-15 DIAGNOSIS — Z95828 Presence of other vascular implants and grafts: Secondary | ICD-10-CM

## 2022-02-15 LAB — CMP (CANCER CENTER ONLY)
ALT: 41 U/L (ref 0–44)
AST: 30 U/L (ref 15–41)
Albumin: 3.8 g/dL (ref 3.5–5.0)
Alkaline Phosphatase: 52 U/L (ref 38–126)
Anion gap: 5 (ref 5–15)
BUN: 10 mg/dL (ref 6–20)
CO2: 28 mmol/L (ref 22–32)
Calcium: 8.8 mg/dL — ABNORMAL LOW (ref 8.9–10.3)
Chloride: 105 mmol/L (ref 98–111)
Creatinine: 0.5 mg/dL (ref 0.44–1.00)
GFR, Estimated: >60 mL/min (ref >60)
Glucose, Bld: 109 mg/dL — ABNORMAL HIGH (ref 70–99)
Potassium: 3.9 mmol/L (ref 3.5–5.1)
Sodium: 138 mmol/L (ref 135–145)
Total Bilirubin: 0.3 mg/dL (ref 0.3–1.2)
Total Protein: 6.1 g/dL — ABNORMAL LOW (ref 6.5–8.1)

## 2022-02-15 LAB — CBC WITH DIFFERENTIAL (CANCER CENTER ONLY)
Abs Immature Granulocytes: 0.01 10*3/uL (ref 0.00–0.07)
Basophils Absolute: 0 10*3/uL (ref 0.0–0.1)
Basophils Relative: 1 %
Eosinophils Absolute: 0 10*3/uL (ref 0.0–0.5)
Eosinophils Relative: 0 %
HCT: 25.8 % — ABNORMAL LOW (ref 36.0–46.0)
Hemoglobin: 8.9 g/dL — ABNORMAL LOW (ref 12.0–15.0)
Immature Granulocytes: 0 %
Lymphocytes Relative: 22 %
Lymphs Abs: 0.5 10*3/uL — ABNORMAL LOW (ref 0.7–4.0)
MCH: 35.7 pg — ABNORMAL HIGH (ref 26.0–34.0)
MCHC: 34.5 g/dL (ref 30.0–36.0)
MCV: 103.6 fL — ABNORMAL HIGH (ref 80.0–100.0)
Monocytes Absolute: 0.2 10*3/uL (ref 0.1–1.0)
Monocytes Relative: 8 %
Neutro Abs: 1.5 10*3/uL — ABNORMAL LOW (ref 1.7–7.7)
Neutrophils Relative %: 69 %
Platelet Count: 166 10*3/uL (ref 150–400)
RBC: 2.49 MIL/uL — ABNORMAL LOW (ref 3.87–5.11)
RDW: 14.4 % (ref 11.5–15.5)
WBC Count: 2.3 10*3/uL — ABNORMAL LOW (ref 4.0–10.5)
nRBC: 0 % (ref 0.0–0.2)

## 2022-02-15 MED ORDER — HEPARIN SOD (PORK) LOCK FLUSH 100 UNIT/ML IV SOLN
500.0000 [IU] | Freq: Once | INTRAVENOUS | Status: AC | PRN
  Administered 2022-02-15: 500 [IU]

## 2022-02-15 MED ORDER — SODIUM CHLORIDE 0.9% FLUSH
10.0000 mL | Freq: Once | INTRAVENOUS | Status: AC
  Administered 2022-02-15: 10 mL

## 2022-02-15 MED ORDER — SODIUM CHLORIDE 0.9 % IV SOLN
165.3000 mg | Freq: Once | INTRAVENOUS | Status: AC
  Administered 2022-02-15: 170 mg via INTRAVENOUS
  Filled 2022-02-15: qty 17

## 2022-02-15 MED ORDER — CETIRIZINE HCL 10 MG/ML IV SOLN
10.0000 mg | Freq: Once | INTRAVENOUS | Status: AC
  Administered 2022-02-15: 10 mg via INTRAVENOUS
  Filled 2022-02-15: qty 1

## 2022-02-15 MED ORDER — PALONOSETRON HCL INJECTION 0.25 MG/5ML
0.2500 mg | Freq: Once | INTRAVENOUS | Status: AC
  Administered 2022-02-15: 0.25 mg via INTRAVENOUS
  Filled 2022-02-15: qty 5

## 2022-02-15 MED ORDER — SODIUM CHLORIDE 0.9 % IV SOLN: Freq: Once | INTRAVENOUS | Status: AC

## 2022-02-15 MED ORDER — SODIUM CHLORIDE 0.9 % IV SOLN
80.0000 mg/m2 | Freq: Once | INTRAVENOUS | Status: AC
  Administered 2022-02-15: 144 mg via INTRAVENOUS
  Filled 2022-02-15: qty 24

## 2022-02-15 MED ORDER — FAMOTIDINE IN NACL 20-0.9 MG/50ML-% IV SOLN
20.0000 mg | Freq: Once | INTRAVENOUS | Status: AC
  Administered 2022-02-15: 20 mg via INTRAVENOUS
  Filled 2022-02-15: qty 50

## 2022-02-15 MED ORDER — SODIUM CHLORIDE 0.9% FLUSH
10.0000 mL | INTRAVENOUS | Status: DC | PRN
  Administered 2022-02-15: 10 mL

## 2022-02-15 MED ORDER — SODIUM CHLORIDE 0.9 % IV SOLN
10.0000 mg | Freq: Once | INTRAVENOUS | Status: AC
  Administered 2022-02-15: 10 mg via INTRAVENOUS
  Filled 2022-02-15: qty 10

## 2022-02-15 NOTE — Patient Instructions (Signed)
[facility]  Discharge Instructions: Thank you for choosing [facility] to provide your oncology and hematology care.   If you have a lab appointment with the Cancer Center, please go directly to the Cancer Center and check in at the registration area.   Wear comfortable clothing and clothing appropriate for easy access to any Portacath or PICC line.   We strive to give you quality time with your provider. You may need to reschedule your appointment if you arrive late (15 or more minutes).  Arriving late affects you and other patients whose appointments are after yours.  Also, if you miss three or more appointments without notifying the office, you may be dismissed from the clinic at the provider's discretion.      For prescription refill requests, have your pharmacy contact our office and allow 72 hours for refills to be completed.    Today you received the following chemotherapy and/or immunotherapy agents: Paclitaxel (Taxol) and Carboplatin.    To help prevent nausea and vomiting after your treatment, we encourage you to take your nausea medication as directed.  BELOW ARE SYMPTOMS THAT SHOULD BE REPORTED IMMEDIATELY: *FEVER GREATER THAN 100.4 F (38 C) OR HIGHER *CHILLS OR SWEATING *NAUSEA AND VOMITING THAT IS NOT CONTROLLED WITH YOUR NAUSEA MEDICATION *UNUSUAL SHORTNESS OF BREATH *UNUSUAL BRUISING OR BLEEDING *URINARY PROBLEMS (pain or burning when urinating, or frequent urination) *BOWEL PROBLEMS (unusual diarrhea, constipation, pain near the anus) TENDERNESS IN MOUTH AND THROAT WITH OR WITHOUT PRESENCE OF ULCERS (sore throat, sores in mouth, or a toothache) UNUSUAL RASH, SWELLING OR PAIN  UNUSUAL VAGINAL DISCHARGE OR ITCHING   Items with * indicate a potential emergency and should be followed up as soon as possible or go to the Emergency Department if any problems should occur.  Please show the CHEMOTHERAPY ALERT CARD or IMMUNOTHERAPY  ALERT CARD at check-in to the Emergency Department and triage nurse.  Should you have questions after your visit or need to cancel or reschedule your appointment, please contact [facility]  Dept: [phone]  and follow the prompts.  Office hours are 8:00 a.m. to 4:30 p.m. Monday - Friday. Please note that voicemails left after 4:00 p.m. may not be returned until the following business day.  We are closed weekends and major holidays. You have access to a nurse at all times for urgent questions. Please call the main number to the clinic Dept: [phone] and follow the prompts.   For any non-urgent questions, you may also contact your provider using MyChart. We now offer e-Visits for anyone 18 and older to request care online for non-urgent symptoms. For details visit [email or web address].   Also download the MyChart app! Go to the app store, search "MyChart", open the app, select , and log in with your MyChart username and password.  Masks are optional in the cancer centers. If you would like for your care team to wear a mask while they are taking care of you, please let them know. You may have one support person who is at least 55 years old accompany you for your appointments. Paclitaxel Injection What is this medication? PACLITAXEL (PAK li TAX el) treats some types of cancer. It works by slowing down the growth of cancer cells. This medicine may be used for other purposes; ask your health care provider or pharmacist if you have questions. COMMON BRAND NAME(S): Onxol, Taxol What should I tell my care team before I take this   medication? They need to know if you have any of these conditions: Heart disease Liver disease Low white blood cell levels An unusual or allergic reaction to paclitaxel, other medications, foods, dyes, or preservatives If you or your partner are pregnant or trying to get pregnant Breast-feeding How should I use this  medication? This medication is injected into a vein. It is given by your care team in a hospital or clinic setting. Talk to your care team about the use of this medication in children. While it may be given to children for selected conditions, precautions do apply. Overdosage: If you think you have taken too much of this medicine contact a poison control center or emergency room at once. NOTE: This medicine is only for you. Do not share this medicine with others. What if I miss a dose? Keep appointments for follow-up doses. It is important not to miss your dose. Call your care team if you are unable to keep an appointment. What may interact with this medication? Do not take this medication with any of the following: Live virus vaccines Other medications may affect the way this medication works. Talk with your care team about all of the medications you take. They may suggest changes to your treatment plan to lower the risk of side effects and to make sure your medications work as intended. This list may not describe all possible interactions. Give your health care provider a list of all the medicines, herbs, non-prescription drugs, or dietary supplements you use. Also tell them if you smoke, drink alcohol, or use illegal drugs. Some items may interact with your medicine. What should I watch for while using this medication? Your condition will be monitored carefully while you are receiving this medication. You may need blood work while taking this medication. This medication may make you feel generally unwell. This is not uncommon as chemotherapy can affect healthy cells as well as cancer cells. Report any side effects. Continue your course of treatment even though you feel ill unless your care team tells you to stop. This medication can cause serious allergic reactions. To reduce the risk, your care team may give you other medications to take before receiving this one. Be sure to follow the directions  from your care team. This medication may increase your risk of getting an infection. Call your care team for advice if you get a fever, chills, sore throat, or other symptoms of a cold or flu. Do not treat yourself. Try to avoid being around people who are sick. This medication may increase your risk to bruise or bleed. Call your care team if you notice any unusual bleeding. Be careful brushing or flossing your teeth or using a toothpick because you may get an infection or bleed more easily. If you have any dental work done, tell your dentist you are receiving this medication. Talk to your care team if you may be pregnant. Serious birth defects can occur if you take this medication during pregnancy. Talk to your care team before breastfeeding. Changes to your treatment plan may be needed. What side effects may I notice from receiving this medication? Side effects that you should report to your care team as soon as possible: Allergic reactions--skin rash, itching, hives, swelling of the face, lips, tongue, or throat Heart rhythm changes--fast or irregular heartbeat, dizziness, feeling faint or lightheaded, chest pain, trouble breathing Increase in blood pressure Infection--fever, chills, cough, sore throat, wounds that don't heal, pain or trouble when passing urine, general feeling of   discomfort or being unwell Low blood pressure--dizziness, feeling faint or lightheaded, blurry vision Low red blood cell level--unusual weakness or fatigue, dizziness, headache, trouble breathing Painful swelling, warmth, or redness of the skin, blisters or sores at the infusion site Pain, tingling, or numbness in the hands or feet Slow heartbeat--dizziness, feeling faint or lightheaded, confusion, trouble breathing, unusual weakness or fatigue Unusual bruising or bleeding Side effects that usually do not require medical attention (report to your care team if they continue or are bothersome): Diarrhea Hair  loss Joint pain Loss of appetite Muscle pain Nausea Vomiting This list may not describe all possible side effects. Call your doctor for medical advice about side effects. You may report side effects to FDA at 1-800-FDA-1088. Where should I keep my medication? This medication is given in a hospital or clinic. It will not be stored at home. NOTE: This sheet is a summary. It may not cover all possible information. If you have questions about this medicine, talk to your doctor, pharmacist, or health care provider.  2023 Elsevier/Gold Standard (2021-07-26 00:00:00) Carboplatin Injection What is this medication? CARBOPLATIN (KAR boe pla tin) treats some types of cancer. It works by slowing down the growth of cancer cells. This medicine may be used for other purposes; ask your health care provider or pharmacist if you have questions. COMMON BRAND NAME(S): Paraplatin What should I tell my care team before I take this medication? They need to know if you have any of these conditions: Blood disorders Hearing problems Kidney disease Recent or ongoing radiation therapy An unusual or allergic reaction to carboplatin, cisplatin, other medications, foods, dyes, or preservatives Pregnant or trying to get pregnant Breast-feeding How should I use this medication? This medication is injected into a vein. It is given by your care team in a hospital or clinic setting. Talk to your care team about the use of this medication in children. Special care may be needed. Overdosage: If you think you have taken too much of this medicine contact a poison control center or emergency room at once. NOTE: This medicine is only for you. Do not share this medicine with others. What if I miss a dose? Keep appointments for follow-up doses. It is important not to miss your dose. Call your care team if you are unable to keep an appointment. What may interact with this medication? Medications for seizures Some antibiotics,  such as amikacin, gentamicin, neomycin, streptomycin, tobramycin Vaccines This list may not describe all possible interactions. Give your health care provider a list of all the medicines, herbs, non-prescription drugs, or dietary supplements you use. Also tell them if you smoke, drink alcohol, or use illegal drugs. Some items may interact with your medicine. What should I watch for while using this medication? Your condition will be monitored carefully while you are receiving this medication. You may need blood work while taking this medication. This medication may make you feel generally unwell. This is not uncommon, as chemotherapy can affect healthy cells as well as cancer cells. Report any side effects. Continue your course of treatment even though you feel ill unless your care team tells you to stop. In some cases, you may be given additional medications to help with side effects. Follow all directions for their use. This medication may increase your risk of getting an infection. Call your care team for advice if you get a fever, chills, sore throat, or other symptoms of a cold or flu. Do not treat yourself. Try to avoid being around   people who are sick. Avoid taking medications that contain aspirin, acetaminophen, ibuprofen, naproxen, or ketoprofen unless instructed by your care team. These medications may hide a fever. Be careful brushing or flossing your teeth or using a toothpick because you may get an infection or bleed more easily. If you have any dental work done, tell your dentist you are receiving this medication. Talk to your care team if you wish to become pregnant or think you might be pregnant. This medication can cause serious birth defects. Talk to your care team about effective forms of contraception. Do not breast-feed while taking this medication. What side effects may I notice from receiving this medication? Side effects that you should report to your care team as soon as  possible: Allergic reactions--skin rash, itching, hives, swelling of the face, lips, tongue, or throat Infection--fever, chills, cough, sore throat, wounds that don't heal, pain or trouble when passing urine, general feeling of discomfort or being unwell Low red blood cell level--unusual weakness or fatigue, dizziness, headache, trouble breathing Pain, tingling, or numbness in the hands or feet, muscle weakness, change in vision, confusion or trouble speaking, loss of balance or coordination, trouble walking, seizures Unusual bruising or bleeding Side effects that usually do not require medical attention (report to your care team if they continue or are bothersome): Hair loss Nausea Unusual weakness or fatigue Vomiting This list may not describe all possible side effects. Call your doctor for medical advice about side effects. You may report side effects to FDA at 1-800-FDA-1088. Where should I keep my medication? This medication is given in a hospital or clinic. It will not be stored at home. NOTE: This sheet is a summary. It may not cover all possible information. If you have questions about this medicine, talk to your doctor, pharmacist, or health care provider.  2023 Elsevier/Gold Standard (2021-07-10 00:00:00)   

## 2022-02-16 ENCOUNTER — Inpatient Hospital Stay: Payer: BC Managed Care – PPO

## 2022-02-16 VITALS — BP 124/68 | HR 79 | Temp 98.3°F | Resp 16

## 2022-02-16 DIAGNOSIS — C50411 Malignant neoplasm of upper-outer quadrant of right female breast: Secondary | ICD-10-CM | POA: Diagnosis not present

## 2022-02-16 MED ORDER — FILGRASTIM-AAFI 300 MCG/0.5ML IJ SOSY
300.0000 ug | PREFILLED_SYRINGE | Freq: Once | INTRAMUSCULAR | Status: AC
  Administered 2022-02-16: 300 ug via SUBCUTANEOUS
  Filled 2022-02-16: qty 0.5

## 2022-02-17 ENCOUNTER — Inpatient Hospital Stay: Payer: BC Managed Care – PPO

## 2022-02-17 VITALS — BP 109/63 | HR 84 | Temp 99.1°F | Resp 16 | Ht 67.0 in

## 2022-02-17 DIAGNOSIS — C50411 Malignant neoplasm of upper-outer quadrant of right female breast: Secondary | ICD-10-CM

## 2022-02-17 MED ORDER — FILGRASTIM-AAFI 300 MCG/0.5ML IJ SOSY
300.0000 ug | PREFILLED_SYRINGE | Freq: Once | INTRAMUSCULAR | Status: AC
  Administered 2022-02-17: 300 ug via SUBCUTANEOUS

## 2022-02-19 ENCOUNTER — Inpatient Hospital Stay: Payer: BC Managed Care – PPO

## 2022-02-19 VITALS — BP 120/79 | HR 86 | Temp 98.6°F | Resp 16

## 2022-02-19 DIAGNOSIS — C50411 Malignant neoplasm of upper-outer quadrant of right female breast: Secondary | ICD-10-CM

## 2022-02-19 DIAGNOSIS — Z171 Estrogen receptor negative status [ER-]: Secondary | ICD-10-CM

## 2022-02-19 MED ORDER — FILGRASTIM-AAFI 300 MCG/0.5ML IJ SOSY
300.0000 ug | PREFILLED_SYRINGE | Freq: Once | INTRAMUSCULAR | Status: AC
  Administered 2022-02-19: 300 ug via SUBCUTANEOUS
  Filled 2022-02-19: qty 0.5

## 2022-02-22 MED FILL — Dexamethasone Sodium Phosphate Inj 100 MG/10ML: INTRAMUSCULAR | Qty: 1 | Status: AC

## 2022-02-23 ENCOUNTER — Inpatient Hospital Stay: Payer: BC Managed Care – PPO

## 2022-02-23 ENCOUNTER — Encounter: Payer: Self-pay | Admitting: Hematology and Oncology

## 2022-02-23 ENCOUNTER — Inpatient Hospital Stay (HOSPITAL_BASED_OUTPATIENT_CLINIC_OR_DEPARTMENT_OTHER): Payer: BC Managed Care – PPO | Admitting: Hematology and Oncology

## 2022-02-23 VITALS — BP 141/80 | HR 80 | Temp 97.8°F | Resp 16 | Ht 67.0 in | Wt 157.4 lb

## 2022-02-23 VITALS — BP 116/78 | HR 74 | Resp 17

## 2022-02-23 DIAGNOSIS — G620 Drug-induced polyneuropathy: Secondary | ICD-10-CM

## 2022-02-23 DIAGNOSIS — T451X5A Adverse effect of antineoplastic and immunosuppressive drugs, initial encounter: Secondary | ICD-10-CM

## 2022-02-23 DIAGNOSIS — M898X9 Other specified disorders of bone, unspecified site: Secondary | ICD-10-CM | POA: Diagnosis not present

## 2022-02-23 DIAGNOSIS — Z171 Estrogen receptor negative status [ER-]: Secondary | ICD-10-CM

## 2022-02-23 DIAGNOSIS — C50411 Malignant neoplasm of upper-outer quadrant of right female breast: Secondary | ICD-10-CM

## 2022-02-23 DIAGNOSIS — Z95828 Presence of other vascular implants and grafts: Secondary | ICD-10-CM

## 2022-02-23 LAB — CBC WITH DIFFERENTIAL (CANCER CENTER ONLY)
Abs Immature Granulocytes: 0.03 10*3/uL (ref 0.00–0.07)
Basophils Absolute: 0 10*3/uL (ref 0.0–0.1)
Basophils Relative: 1 %
Eosinophils Absolute: 0 10*3/uL (ref 0.0–0.5)
Eosinophils Relative: 1 %
HCT: 27.7 % — ABNORMAL LOW (ref 36.0–46.0)
Hemoglobin: 9.3 g/dL — ABNORMAL LOW (ref 12.0–15.0)
Immature Granulocytes: 1 %
Lymphocytes Relative: 24 %
Lymphs Abs: 1 10*3/uL (ref 0.7–4.0)
MCH: 35.8 pg — ABNORMAL HIGH (ref 26.0–34.0)
MCHC: 33.6 g/dL (ref 30.0–36.0)
MCV: 106.5 fL — ABNORMAL HIGH (ref 80.0–100.0)
Monocytes Absolute: 0.6 10*3/uL (ref 0.1–1.0)
Monocytes Relative: 15 %
Neutro Abs: 2.5 10*3/uL (ref 1.7–7.7)
Neutrophils Relative %: 58 %
Platelet Count: 177 10*3/uL (ref 150–400)
RBC: 2.6 MIL/uL — ABNORMAL LOW (ref 3.87–5.11)
RDW: 14.7 % (ref 11.5–15.5)
WBC Count: 4.1 10*3/uL (ref 4.0–10.5)
nRBC: 0 % (ref 0.0–0.2)

## 2022-02-23 LAB — CMP (CANCER CENTER ONLY)
ALT: 60 U/L — ABNORMAL HIGH (ref 0–44)
AST: 43 U/L — ABNORMAL HIGH (ref 15–41)
Albumin: 4 g/dL (ref 3.5–5.0)
Alkaline Phosphatase: 65 U/L (ref 38–126)
Anion gap: 3 — ABNORMAL LOW (ref 5–15)
BUN: 11 mg/dL (ref 6–20)
CO2: 29 mmol/L (ref 22–32)
Calcium: 8.9 mg/dL (ref 8.9–10.3)
Chloride: 106 mmol/L (ref 98–111)
Creatinine: 0.45 mg/dL (ref 0.44–1.00)
GFR, Estimated: >60 mL/min (ref >60)
Glucose, Bld: 95 mg/dL (ref 70–99)
Potassium: 3.8 mmol/L (ref 3.5–5.1)
Sodium: 138 mmol/L (ref 135–145)
Total Bilirubin: 0.3 mg/dL (ref 0.3–1.2)
Total Protein: 6.5 g/dL (ref 6.5–8.1)

## 2022-02-23 LAB — TSH: TSH: 2.888 u[IU]/mL (ref 0.350–4.500)

## 2022-02-23 MED ORDER — SODIUM CHLORIDE 0.9 % IV SOLN: Freq: Once | INTRAVENOUS | Status: AC

## 2022-02-23 MED ORDER — PALONOSETRON HCL INJECTION 0.25 MG/5ML
0.2500 mg | Freq: Once | INTRAVENOUS | Status: AC
  Administered 2022-02-23: 0.25 mg via INTRAVENOUS
  Filled 2022-02-23: qty 5

## 2022-02-23 MED ORDER — SODIUM CHLORIDE 0.9% FLUSH
10.0000 mL | Freq: Once | INTRAVENOUS | Status: AC
  Administered 2022-02-23: 10 mL

## 2022-02-23 MED ORDER — SODIUM CHLORIDE 0.9 % IV SOLN
165.3000 mg | Freq: Once | INTRAVENOUS | Status: AC
  Administered 2022-02-23: 170 mg via INTRAVENOUS
  Filled 2022-02-23: qty 17

## 2022-02-23 MED ORDER — FAMOTIDINE IN NACL 20-0.9 MG/50ML-% IV SOLN
20.0000 mg | Freq: Once | INTRAVENOUS | Status: AC
  Administered 2022-02-23: 20 mg via INTRAVENOUS
  Filled 2022-02-23: qty 50

## 2022-02-23 MED ORDER — SODIUM CHLORIDE 0.9 % IV SOLN
10.0000 mg | Freq: Once | INTRAVENOUS | Status: AC
  Administered 2022-02-23: 10 mg via INTRAVENOUS
  Filled 2022-02-23: qty 10

## 2022-02-23 MED ORDER — HEPARIN SOD (PORK) LOCK FLUSH 100 UNIT/ML IV SOLN
500.0000 [IU] | Freq: Once | INTRAVENOUS | Status: AC | PRN
  Administered 2022-02-23: 500 [IU]

## 2022-02-23 MED ORDER — SODIUM CHLORIDE 0.9 % IV SOLN
200.0000 mg | Freq: Once | INTRAVENOUS | Status: AC
  Administered 2022-02-23: 200 mg via INTRAVENOUS
  Filled 2022-02-23: qty 200

## 2022-02-23 MED ORDER — SODIUM CHLORIDE 0.9% FLUSH
10.0000 mL | INTRAVENOUS | Status: DC | PRN
  Administered 2022-02-23: 10 mL

## 2022-02-23 MED ORDER — SODIUM CHLORIDE 0.9 % IV SOLN
80.0000 mg/m2 | Freq: Once | INTRAVENOUS | Status: AC
  Administered 2022-02-23: 144 mg via INTRAVENOUS
  Filled 2022-02-23: qty 24

## 2022-02-23 MED ORDER — CETIRIZINE HCL 10 MG/ML IV SOLN
10.0000 mg | Freq: Once | INTRAVENOUS | Status: AC
  Administered 2022-02-23: 10 mg via INTRAVENOUS
  Filled 2022-02-23: qty 1

## 2022-02-23 NOTE — Progress Notes (Signed)
[facility] PROGRESS NOTE  Patient Care Team: Rosine Door as PCP - General (Physician Assistant) Rockwell Germany, RN as Oncology Nurse Navigator Mauro Kaufmann, RN as Oncology Nurse Navigator Jovita Kussmaul, MD as Consulting Physician (General Surgery) Benay Pike, MD as Consulting Physician (Hematology and Oncology) Kyung Rudd, MD as Consulting Physician (Radiation Oncology)  CHIEF COMPLAINTS:  Breast cancer  SUMMARY OF ONCOLOGIC HISTORY: Oncology History  Malignant neoplasm of upper-outer quadrant of right breast in female, estrogen receptor negative ([place])  09/25/2021 Imaging   Bilateral screening mammogram with irregular hypoechoic mass in the right breast at 12:00 axis, 5 cm from nipple measuring 2.5 cm corresponding to the palpable area of concern. Ultrasound confirmed irregular hypoechoic mass in the right breast at the 12:00 5 cm from the nipple measuring 2.5 cm corresponding to the palpable area of concern.   09/26/2021 Pathology Results   Right breast needle core biopsy showed invasive ductal carcinoma, high-grade prognostic showed ER 0% negative PR 0% negative Ki-67 of 95% and HER2 negative   09/28/2021 Initial Diagnosis   Malignant neoplasm of upper-outer quadrant of right breast in female, estrogen receptor negative ([place])   10/16/2021 Genetic Testing   Negative hereditary cancer genetic testing: no pathogenic variants detected in [facility] CustomNext-Cancer +RNAinsight Panel.  Report date is October 16, 2021.   The CustomNext-Cancer+RNAinsight panel offered by [facility] includes sequencing and rearrangement analysis for the following 47 genes:  APC, ATM, AXIN2, BARD1, BMPR1A, BRCA1, BRCA2, BRIP1, CDH1, CDK4, CDKN2A, CHEK2, DICER1, EPCAM, GREM1, HOXB13, MEN1, MLH1, MSH2, MSH3, MSH6, MUTYH, NBN, NF1, NF2, NTHL1, PALB2, PMS2, POLD1, POLE, PTEN, RAD51C, RAD51D, RECQL, RET, SDHA, SDHAF2, SDHB, SDHC, SDHD, SMAD4, SMARCA4, STK11, TP53, TSC1, TSC2, and  VHL.  RNA data is routinely analyzed for use in variant interpretation for all genes.    10/20/2021 - 12/02/2021 Chemotherapy   Patient is on Treatment Plan : BREAST  Pembrolizumab (200) D1 + AC D1 q21d x 4 cycles / Pembrolizumab (200) D1 + Carboplatin (1.5) D1,8,15 + Paclitaxel (80) D1,8,15 q21d X 4 cycles     10/20/2021 -  Chemotherapy   Patient is on Treatment Plan : BREAST Pembrolizumab (200) D1 + AC D1 q21d x 4 cycles / Pembrolizumab (200) D1 + Carboplatin (1.5) D1,8,15 + Paclitaxel (80) D1,8,15 q21d X 4 cycles      INTERVAL HISTORY:  Karigan Cloninger returns for follow-up before cycle 5-day 15.   She is now on weekly CarboTaxol with Beryle Flock and has been tolerating it very well.  She tells me that she cannot quite feel the breast mass anymore.  She is very happy so far with how the response has been going on.  She does report some mild intermittent neuropathy in her feet which is not at all bothersome.  No nausea, vomiting, diarrhea.  No fevers or chills.  Rest of the pertinent 10 point ROS reviewed and negative.  MEDICAL HISTORY:  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[place])    09/26/2021 (right breast biopsy)   Mild mitral regurgitation 06/2019   Palpitations    PONV (postoperative nausea and vomiting)     SURGICAL HISTORY: Past Surgical History:  Procedure Laterality Date   CHOLECYSTECTOMY     GALLBLADDER SURGERY     PORTACATH PLACEMENT Left 10/18/2021   Procedure: PLA CEMENT OF PORT;  Surgeon: Marlou Starks,  Sena Hitch, MD;  Location: [facility] OR;  Service: General;  Laterality: Left;    SOCIAL HISTORY: Social History   Socioeconomic History   Marital status: Married    Spouse name: Not on file   Number of children: 2   Years of education: Not on file   Highest education level: Not on file  Occupational History    Comment: Hair dresser  Tobacco Use    Smoking status: Never   Smokeless tobacco: Never  Vaping Use   Vaping Use: Never used  Substance and Sexual Activity   Alcohol use: Yes    Alcohol/week: 0.0 standard drinks of alcohol    Comment: Glass wine per day   Drug use: No   Sexual activity: Yes    Partners: Male  Other Topics Concern   Not on file  Social History Narrative   Pt is a hair stylist   Married (3rd marriage)   2 sons (both grown)   Enjoys yard work, hiking, Scientist, clinical (histocompatibility and immunogenetics)   Complete 2 year cosmetology   Has one Neurosurgeon   Social Determinants of Radio broadcast assistant Strain: Not on Art therapist Insecurity: Not on file  Transportation Needs: Not on file  Physical Activity: Not on file  Stress: Not on file  Social Connections: Not on file  Intimate Partner Violence: Not on file    FAMILY HISTORY: Family History  Problem Relation Age of Onset   Hypertension Mother    Heart attack Mother    Heart disease Mother        Atrial fibrillation   Obstructive Sleep Apnea Mother    Hypertension Brother    Stroke Maternal Grandmother 75   Breast cancer Other 46       PGM's sister   Colon cancer Neg Hx    Esophageal cancer Neg Hx     ALLERGIES:  is allergic to ace inhibitors and shellfish allergy.  MEDICATIONS:  Current Outpatient Medications  Medication Sig Dispense Refill   cholecalciferol (VITAMIN D3) 25 MCG (1000 UNIT) tablet Take 1,000 Units by mouth daily.     cyanocobalamin 1000 MCG tablet Take 1,000 mcg by mouth daily.     dexamethasone (DECADRON) 4 MG tablet Take 1 tablets once a day for 3 days after carboplatin and AC chemotherapy. Take with food. 30 tablet 1   lidocaine-prilocaine (EMLA) cream Apply to affected area once 30 g 3   LORazepam (ATIVAN) 0.5 MG tablet Take 0.25 mg by mouth every morning.     magic mouthwash w/lidocaine SOLN 1:1:1 ratio of lidocaine 2%,maalox,diphenhydramine 12.69m/5ml 5-10 cc swish swallow or spit QID prn 240 mL 2   metoprolol succinate (TOPROL XL) 25 MG 24 hr tablet  Take 1 tablet (25 mg total) by mouth daily. 30 tablet 0   Misc Natural Products (TART CHERRY ADVANCED PO) Take 1 tablet by mouth daily. (Patient not taking: Reported on 01/04/2022)     Multiple Vitamins-Minerals (MULTIVIT/MULTIMINERAL ADULT) LIQD Take 30 mLs by mouth daily.     PREVIDENT 5000 BOOSTER PLUS 1.1 % PSTE Place onto teeth as directed.     prochlorperazine (COMPAZINE) 10 MG tablet Take 1 tablet (10 mg total) by mouth every 6 (six) hours as needed (Nausea or vomiting). (Patient not taking: Reported on 01/04/2022) 30 tablet 1   No current facility-administered medications for this visit.    REVIEW OF SYSTEMS:   Constitutional: Denies fevers, chills or abnormal night sweats Eyes: Denies blurriness of vision, double vision or watery eyes Ears, nose, mouth,  throat, and face: Denies mucositis or sore throat Respiratory: Denies cough, dyspnea or wheezes Cardiovascular: Denies palpitation, chest discomfort or lower extremity swelling Gastrointestinal:  Denies nausea, heartburn or change in bowel habits Skin: Denies abnormal skin rashes Lymphatics: Denies new lymphadenopathy or easy bruising Neurological:Denies numbness, tingling or new weaknesses Behavioral/Psych: Mood is stable, no new changes    PHYSICAL EXAMINATION: ECOG PERFORMANCE STATUS: 0 - Asymptomatic  Vitals:   02/23/22 1157  BP: (!) 141/80  Pulse: 80  Resp: 16  Temp: 97.8 F (36.6 C)  SpO2: 100%   Filed Weights   02/23/22 1157  Weight: 157 lb 6.4 oz (71.4 kg)    Physical Exam Constitutional:      Appearance: Normal appearance.  Chest:       Comments: Near resolution of the palpable mass in the right breast.  No palpable regional adenopathy. Musculoskeletal:        General: No swelling.     Cervical back: Normal range of motion and neck supple. No rigidity.  Neurological:     Mental Status: She is alert.      LABORATORY DATA:  I have reviewed the data as listed Lab Results  Component Value Date    WBC 4.1 02/23/2022   HGB 9.3 (L) 02/23/2022   HCT 27.7 (L) 02/23/2022   MCV 106.5 (H) 02/23/2022   PLT 177 02/23/2022   Lab Results  Component Value Date   NA 138 02/15/2022   K 3.9 02/15/2022   CL 105 02/15/2022   CO2 28 02/15/2022    RADIOGRAPHIC STUDIES: I have personally reviewed the radiological reports and agreed with the findings in the report.  ASSESSMENT AND PLAN:  Wendy Webb is a 56 y.o. 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  She appears to have responded much better to taxane when compared to the Adriamycin part of the regimen. --Okay to proceed with chemo if labs are all within parameters.  #Grade 1 peripheral neuropathy, no dose adjustment needed at this time  #G-CSF induced bone pain, mild, will continue to monitor  #Transaminitis, likely from taxane, no dose adjustment needed.  We will continue to follow it.  I have spent a total of 30 minutes minutes of face-to-face and non-face-to-face time, preparing to see the patient,  performing a medically appropriate examination, counseling and educating the patient, documenting clinical information in the electronic health record,and care coordination.   Benay Pike MD

## 2022-02-23 NOTE — Patient Instructions (Signed)
[facility] ONCOL[facility]  Discharge Instructions: Thank you for choosing [facility] to provide your oncology and hematology care.   If you have a lab appointment with the [facility], please go directly to the [facility] and check in at the registration area.   Wear comfortable clothing and clothing appropriate for easy access to any Portacath or PICC line.   We strive to give you quality time with your provider. You may need to reschedule your appointment if you arrive late (15 or more minutes).  Arriving late affects you and other patients whose appointments are after yours.  Also, if you miss three or more appointments without notifying the office, you may be dismissed from the clinic at the provider's discretion.      For prescription refill requests, have your pharmacy contact our office and allow 72 hours for refills to be completed.    Today you received the following chemotherapy and/or immunotherapy agents keytruda, paclitaxel, carboplatin      To help prevent nausea and vomiting after your treatment, we encourage you to take your nausea medication as directed.  BELOW ARE SYMPTOMS THAT SHOULD BE REPORTED IMMEDIATELY: *FEVER GREATER THAN 100.4 F (38 C) OR HIGHER *CHILLS OR SWEATING *NAUSEA AND VOMITING THAT IS NOT CONTROLLED WITH YOUR NAUSEA MEDICATION *UNUSUAL SHORTNESS OF BREATH *UNUSUAL BRUISING OR BLEEDING *URINARY PROBLEMS (pain or burning when urinating, or frequent urination) *BOWEL PROBLEMS (unusual diarrhea, constipation, pain near the anus) TENDERNESS IN MOUTH AND THROAT WITH OR WITHOUT PRESENCE OF ULCERS (sore throat, sores in mouth, or a toothache) UNUSUAL RASH, SWELLING OR PAIN  UNUSUAL VAGINAL DISCHARGE OR ITCHING   Items with * indicate a potential emergency and should be followed up as soon as possible or go to the Emergency Department if any problems should occur.  Please show the CHEMOTHERAPY ALERT CARD or IMMUNOTHERAPY  ALERT CARD at check-in to the Emergency Department and triage nurse.  Should you have questions after your visit or need to cancel or reschedule your appointment, please contact [facility]  Dept: [phone]  and follow the prompts.  Office hours are 8:00 a.m. to 4:30 p.m. Monday - Friday. Please note that voicemails left after 4:00 p.m. may not be returned until the following business day.  We are closed weekends and major holidays. You have access to a nurse at all times for urgent questions. Please call the main number to the clinic Dept: [phone] and follow the prompts.   For any non-urgent questions, you may also contact your provider using MyChart. We now offer e-Visits for anyone 34 and older to request care online for non-urgent symptoms. For details visit [email or web address].   Also download the MyChart app! Go to the app store, search "MyChart", open the app, select [facility], and log in with your MyChart username and password.  Masks are optional in the cancer centers. If you would like for your care team to wear a mask while they are taking care of you, please let them know. You may have one support person who is at least 55 years old accompany you for your appointments.

## 2022-02-28 MED FILL — Dexamethasone Sodium Phosphate Inj 100 MG/10ML: INTRAMUSCULAR | Qty: 1 | Status: AC

## 2022-03-02 ENCOUNTER — Other Ambulatory Visit: Payer: BC Managed Care – PPO

## 2022-03-02 ENCOUNTER — Encounter: Payer: Self-pay | Admitting: Adult Health

## 2022-03-02 ENCOUNTER — Inpatient Hospital Stay (HOSPITAL_BASED_OUTPATIENT_CLINIC_OR_DEPARTMENT_OTHER): Payer: BC Managed Care – PPO | Admitting: Adult Health

## 2022-03-02 ENCOUNTER — Inpatient Hospital Stay: Payer: BC Managed Care – PPO

## 2022-03-02 ENCOUNTER — Other Ambulatory Visit: Payer: Self-pay

## 2022-03-02 ENCOUNTER — Ambulatory Visit: Payer: BC Managed Care – PPO | Admitting: Adult Health

## 2022-03-02 ENCOUNTER — Ambulatory Visit: Payer: BC Managed Care – PPO

## 2022-03-02 VITALS — BP 124/78 | HR 82 | Temp 97.2°F | Resp 16 | Wt 156.1 lb

## 2022-03-02 VITALS — BP 122/70 | HR 82 | Resp 18

## 2022-03-02 DIAGNOSIS — Z171 Estrogen receptor negative status [ER-]: Secondary | ICD-10-CM

## 2022-03-02 DIAGNOSIS — C50411 Malignant neoplasm of upper-outer quadrant of right female breast: Secondary | ICD-10-CM

## 2022-03-02 DIAGNOSIS — Z95828 Presence of other vascular implants and grafts: Secondary | ICD-10-CM

## 2022-03-02 LAB — CBC WITH DIFFERENTIAL (CANCER CENTER ONLY)
Abs Immature Granulocytes: 0 10*3/uL (ref 0.00–0.07)
Basophils Absolute: 0 10*3/uL (ref 0.0–0.1)
Basophils Relative: 1 %
Eosinophils Absolute: 0 10*3/uL (ref 0.0–0.5)
Eosinophils Relative: 0 %
HCT: 26.4 % — ABNORMAL LOW (ref 36.0–46.0)
Hemoglobin: 9.1 g/dL — ABNORMAL LOW (ref 12.0–15.0)
Immature Granulocytes: 0 %
Lymphocytes Relative: 26 %
Lymphs Abs: 0.7 10*3/uL (ref 0.7–4.0)
MCH: 35.7 pg — ABNORMAL HIGH (ref 26.0–34.0)
MCHC: 34.5 g/dL (ref 30.0–36.0)
MCV: 103.5 fL — ABNORMAL HIGH (ref 80.0–100.0)
Monocytes Absolute: 0.2 10*3/uL (ref 0.1–1.0)
Monocytes Relative: 8 %
Neutro Abs: 1.6 10*3/uL — ABNORMAL LOW (ref 1.7–7.7)
Neutrophils Relative %: 65 %
Platelet Count: 140 10*3/uL — ABNORMAL LOW (ref 150–400)
RBC: 2.55 MIL/uL — ABNORMAL LOW (ref 3.87–5.11)
RDW: 14.1 % (ref 11.5–15.5)
WBC Count: 2.5 10*3/uL — ABNORMAL LOW (ref 4.0–10.5)
nRBC: 0 % (ref 0.0–0.2)

## 2022-03-02 LAB — CMP (CANCER CENTER ONLY)
ALT: 49 U/L — ABNORMAL HIGH (ref 0–44)
AST: 36 U/L (ref 15–41)
Albumin: 4 g/dL (ref 3.5–5.0)
Alkaline Phosphatase: 56 U/L (ref 38–126)
Anion gap: 3 — ABNORMAL LOW (ref 5–15)
BUN: 14 mg/dL (ref 6–20)
CO2: 29 mmol/L (ref 22–32)
Calcium: 9.2 mg/dL (ref 8.9–10.3)
Chloride: 106 mmol/L (ref 98–111)
Creatinine: 0.49 mg/dL (ref 0.44–1.00)
GFR, Estimated: >60 mL/min (ref >60)
Glucose, Bld: 87 mg/dL (ref 70–99)
Potassium: 4 mmol/L (ref 3.5–5.1)
Sodium: 138 mmol/L (ref 135–145)
Total Bilirubin: 0.4 mg/dL (ref 0.3–1.2)
Total Protein: 6.3 g/dL — ABNORMAL LOW (ref 6.5–8.1)

## 2022-03-02 MED ORDER — SODIUM CHLORIDE 0.9 % IV SOLN
165.3000 mg | Freq: Once | INTRAVENOUS | Status: AC
  Administered 2022-03-02: 170 mg via INTRAVENOUS
  Filled 2022-03-02: qty 17

## 2022-03-02 MED ORDER — SODIUM CHLORIDE 0.9 % IV SOLN: Freq: Once | INTRAVENOUS | Status: AC

## 2022-03-02 MED ORDER — SODIUM CHLORIDE 0.9 % IV SOLN
10.0000 mg | Freq: Once | INTRAVENOUS | Status: AC
  Administered 2022-03-02: 10 mg via INTRAVENOUS
  Filled 2022-03-02: qty 10

## 2022-03-02 MED ORDER — FLUCONAZOLE 100 MG PO TABS: 100.0000 mg | ORAL_TABLET | Freq: Every day | ORAL | 0 refills | Status: DC

## 2022-03-02 MED ORDER — CETIRIZINE HCL 10 MG/ML IV SOLN
10.0000 mg | Freq: Once | INTRAVENOUS | Status: AC
  Administered 2022-03-02: 10 mg via INTRAVENOUS
  Filled 2022-03-02: qty 1

## 2022-03-02 MED ORDER — SODIUM CHLORIDE 0.9% FLUSH
10.0000 mL | INTRAVENOUS | Status: DC | PRN
  Administered 2022-03-02: 10 mL

## 2022-03-02 MED ORDER — PALONOSETRON HCL INJECTION 0.25 MG/5ML
0.2500 mg | Freq: Once | INTRAVENOUS | Status: AC
  Administered 2022-03-02: 0.25 mg via INTRAVENOUS
  Filled 2022-03-02: qty 5

## 2022-03-02 MED ORDER — HEPARIN SOD (PORK) LOCK FLUSH 100 UNIT/ML IV SOLN
500.0000 [IU] | Freq: Once | INTRAVENOUS | Status: AC | PRN
  Administered 2022-03-02: 500 [IU]

## 2022-03-02 MED ORDER — SODIUM CHLORIDE 0.9 % IV SOLN
65.0000 mg/m2 | Freq: Once | INTRAVENOUS | Status: AC
  Administered 2022-03-02: 114 mg via INTRAVENOUS
  Filled 2022-03-02: qty 19

## 2022-03-02 MED ORDER — SODIUM CHLORIDE 0.9% FLUSH
10.0000 mL | Freq: Once | INTRAVENOUS | Status: AC
  Administered 2022-03-02: 10 mL

## 2022-03-02 MED ORDER — FAMOTIDINE IN NACL 20-0.9 MG/50ML-% IV SOLN
20.0000 mg | Freq: Once | INTRAVENOUS | Status: AC
  Administered 2022-03-02: 20 mg via INTRAVENOUS
  Filled 2022-03-02: qty 50

## 2022-03-02 NOTE — Patient Instructions (Signed)
[facility]COL[facility]  Discharge Instructions: Thank you for choosing [facility] to provide your oncology and hematology care.   If you have a lab appointment with the Cancer Center, please go directly to the Cancer Center and check in at the registration area.   Wear comfortable clothing and clothing appropriate for easy access to any Portacath or PICC line.   We strive to give you quality time with your provider. You may need to reschedule your appointment if you arrive late (15 or more minutes).  Arriving late affects you and other patients whose appointments are after yours.  Also, if you miss three or more appointments without notifying the office, you may be dismissed from the clinic at the provider's discretion.      For prescription refill requests, have your pharmacy contact our office and allow 72 hours for refills to be completed.    Today you received the following chemotherapy and/or immunotherapy agents: Paclitaxel, Carboplatin.       To help prevent nausea and vomiting after your treatment, we encourage you to take your nausea medication as directed.  BELOW ARE SYMPTOMS THAT SHOULD BE REPORTED IMMEDIATELY: *FEVER GREATER THAN 100.4 F (38 C) OR HIGHER *CHILLS OR SWEATING *NAUSEA AND VOMITING THAT IS NOT CONTROLLED WITH YOUR NAUSEA MEDICATION *UNUSUAL SHORTNESS OF BREATH *UNUSUAL BRUISING OR BLEEDING *URINARY PROBLEMS (pain or burning when urinating, or frequent urination) *BOWEL PROBLEMS (unusual diarrhea, constipation, pain near the anus) TENDERNESS IN MOUTH AND THROAT WITH OR WITHOUT PRESENCE OF ULCERS (sore throat, sores in mouth, or a toothache) UNUSUAL RASH, SWELLING OR PAIN  UNUSUAL VAGINAL DISCHARGE OR ITCHING   Items with * indicate a potential emergency and should be followed up as soon as possible or go to the Emergency Department if any problems should occur.  Please show the CHEMOTHERAPY ALERT CARD or IMMUNOTHERAPY ALERT CARD  at check-in to the Emergency Department and triage nurse.  Should you have questions after your visit or need to cancel or reschedule your appointment, please contact [facility]  Dept: [phone]  and follow the prompts.  Office hours are 8:00 a.m. to 4:30 p.m. Monday - Friday. Please note that voicemails left after 4:00 p.m. may not be returned until the following business day.  We are closed weekends and major holidays. You have access to a nurse at all times for urgent questions. Please call the main number to the clinic Dept: [phone] and follow the prompts.   For any non-urgent questions, you may also contact your provider using MyChart. We now offer e-Visits for anyone 18 and older to request care online for non-urgent symptoms. For details visit [email or web address].   Also download the MyChart app! Go to the app store, search "MyChart", open the app, select [facility], and log in with your MyChart username and password.  Masks are optional in the cancer centers. If you would like for your care team to wear a mask while they are taking care of you, please let them know. You may have one support person who is at least 55 years old accompany you for your appointments. 

## 2022-03-02 NOTE — Assessment & Plan Note (Signed)
Wendy Webb is a 56 year old woman with history of stage IIb triple negative breast cancer on neoadjuvant chemotherapy.  Today [name] will proceed with her weekly Taxol and [place].  She received Keytruda last week.  She does have a mild neuropathy that is becoming more constant therefore we will dose reduce her chemo from 80 mg/m to 65 mg/m.  I sent this message to Dr. Lorenso Courier who is signing off on the chemo.  Ayzia also has some mild thrush and I recommended that she restart her Magic mouthwash.  I also sent in an order for fluconazole for her to take the first 3 to 4 days following her chemotherapy treatments.  Jesi will return weekly for her treatments and I am going to add appointments in for her to see a provider with each treatment considering her increased side effects that she is beginning to experience.

## 2022-03-02 NOTE — Progress Notes (Signed)
[facility] Follow up:    Rosalee Kaufman, PA-C [address] [place] [address]   DIAGNOSIS:  Cancer Staging  Malignant neoplasm of upper-outer quadrant of right breast in female, estrogen receptor negative ([place]) Staging form: Breast, AJCC 8th Edition - Clinical stage from 10/02/2021: Stage IIB (cT2, cN0, cM0, G3, ER-, PR-, HER2-) - Unsigned Stage prefix: Initial diagnosis Method of lymph node assessment: Clinical Histologic grading system: 3 grade system   SUMMARY OF ONCOLOGIC HISTORY: Oncology History  Malignant neoplasm of upper-outer quadrant of right breast in female, estrogen receptor negative ([place])  09/25/2021 Imaging   Bilateral screening mammogram with irregular hypoechoic mass in the right breast at 12:00 axis, 5 cm from nipple measuring 2.5 cm corresponding to the palpable area of concern. Ultrasound confirmed irregular hypoechoic mass in the right breast at the 12:00 5 cm from the nipple measuring 2.5 cm corresponding to the palpable area of concern.   09/26/2021 Pathology Results   Right breast needle core biopsy showed invasive ductal carcinoma, high-grade prognostic showed ER 0% negative PR 0% negative Ki-67 of 95% and HER2 negative   09/28/2021 Initial Diagnosis   Malignant neoplasm of upper-outer quadrant of right breast in female, estrogen receptor negative ([facility])   10/16/2021 Genetic Testing   Negative hereditary cancer genetic testing: no pathogenic variants detected in [facility] CustomNext-Cancer +RNAinsight Panel.  Report date is October 16, 2021.   The CustomNext-Cancer+RNAinsight panel offered by [facility] includes sequencing and rearrangement analysis for the following 47 genes:  APC, ATM, AXIN2, BARD1, BMPR1A, BRCA1, BRCA2, BRIP1, CDH1, CDK4, CDKN2A, CHEK2, DICER1, EPCAM, GREM1, HOXB13, MEN1, MLH1, MSH2, MSH3, MSH6, MUTYH, NBN, NF1, NF2, NTHL1, PALB2, PMS2, POLD1, POLE, PTEN, RAD51C, RAD51D, RECQL, RET, SDHA, SDHAF2, SDHB, SDHC, SDHD,  SMAD4, SMARCA4, STK11, TP53, TSC1, TSC2, and VHL.  RNA data is routinely analyzed for use in variant interpretation for all genes.    10/20/2021 - 12/02/2021 Chemotherapy   Patient is on Treatment Plan : BREAST  Pembrolizumab (200) D1 + AC D1 q21d x 4 cycles / Pembrolizumab (200) D1 + Carboplatin (1.5) D1,8,15 + Paclitaxel (80) D1,8,15 q21d X 4 cycles     10/20/2021 -  Chemotherapy   Patient is on Treatment Plan : BREAST Pembrolizumab (200) D1 + AC D1 q21d x 4 cycles / Pembrolizumab (200) D1 + Carboplatin (1.5) D1,8,15 + Paclitaxel (80) D1,8,15 q21d X 4 cycles       CURRENT THERAPY: Week 8 of weekly Taxol/Carbo with Keytruda given every 3 weeks (last 02/23/2022)  INTERVAL HISTORY: [name] 56 y.o. female returns for follow-up and evaluation as she undergoes neoadjuvant chemotherapy.  She completed 4 cycles of Adriamycin Cytoxan and Keytruda given on day 1 of a 21-day cycle on December 22, 2021.  Now she has continued on Keytruda given every 3 weeks with Taxol carbo given weekly.  She is receiving growth factor injections to prevent neutropenia every 3 weeks with Granix injections.  She did undergo an ultrasound of her breast on January 16, 2022 that demonstrated a decrease in the size of her known malignancy of 2.1 x 1.7 x 1.6 cm.  Analysa is experiencing a low-grade neuropathy in her toes bilaterally.  She says that it feels more constant and is slightly worse than last week.  She is also experiencing some soreness in her mouth.  She has Magic mouthwash but has not used it for this.  She is unsure if she is seeing any thrush.  Otherwise she is doing well  and has no significant concerns today.   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  MEDICAL HISTORY: 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[place])    09/26/2021 (right breast biopsy)   Mild mitral regurgitation 06/2019   Palpitations    PONV (postoperative nausea and vomiting)     SURGICAL HISTORY: Past Surgical History:  Procedure Laterality Date   CHOLECYSTECTOMY     GALLBLADDER SURGERY     PORTACATH PLACEMENT Left 10/18/2021   Procedure: PLA CEMENT OF PORT;  Surgeon: Jovita Kussmaul, MD;  Location: [facility] OR;  Service: General;  Laterality: Left;    SOCIAL HISTORY: Social History   Socioeconomic History   Marital status: Married    Spouse name: Not on file   Number of children: 2   Years of education: Not on file   Highest education level: Not on file  Occupational History    Comment: Hair dresser  Tobacco Use   Smoking status: Never   Smokeless tobacco: Never  Vaping Use   Vaping Use: Never used  Substance and Sexual Activity   Alcohol use: Yes    Alcohol/week: 0.0 standard drinks of alcohol    Comment: Glass wine per day   Drug use: No   Sexual activity: Yes    Partners: Male  Other Topics Concern   Not on file  Social History Narrative   Pt is a hair stylist   Married (3rd marriage)   2 sons (both grown)   Enjoys yard work, hiking, Scientist, clinical (histocompatibility and immunogenetics)   Complete 2 year cosmetology   Has one Neurosurgeon   Social Determinants of Radio broadcast assistant Strain: Not on Art therapist Insecurity: Not on file  Transportation Needs: Not on file  Physical Activity: Not on file  Stress: Not on file  Social Connections: Not on file  Intimate Partner Violence:  Not on file    FAMILY HISTORY: Family History  Problem Relation Age of Onset   Hypertension Mother    Heart attack Mother    Heart disease Mother        Atrial fibrillation   Obstructive Sleep Apnea Mother    Hypertension Brother    Stroke Maternal Grandmother 75   Breast cancer Other 43       PGM's sister   Colon cancer Neg Hx    Esophageal cancer Neg Hx     Review of Systems - Oncology    PHYSICAL EXAMINATION  ECOG PERFORMANCE STATUS: 1 - Symptomatic but completely ambulatory  Vitals:   03/02/22 0959  BP: 124/78  Pulse: 82  Resp: 16  Temp: (!) 97.2 F (36.2 C)  SpO2: 100%    Physical Exam Constitutional:      General: She is not in acute distress.    Appearance: Normal appearance. She is not toxic-appearing.  HENT:  Head: Normocephalic and atraumatic.     Mouth/Throat:     Comments: Thrush noted in the posterior buccal mucosa. Eyes:     General: No scleral icterus. Cardiovascular:     Rate and Rhythm: Normal rate and regular rhythm.     Pulses: Normal pulses.     Heart sounds: Normal heart sounds.  Pulmonary:     Effort: Pulmonary effort is normal.     Breath sounds: Normal breath sounds.  Abdominal:     General: Abdomen is flat. Bowel sounds are normal. There is no distension.     Palpations: Abdomen is soft.     Tenderness: There is no abdominal tenderness.  Musculoskeletal:        General: No swelling.     Cervical back: Neck supple.  Lymphadenopathy:     Cervical: No cervical adenopathy.  Skin:    General: Skin is warm and dry.     Findings: No rash.  Neurological:     General: No focal deficit present.     Mental Status: She is alert.  Psychiatric:        Mood and Affect: Mood normal.        Behavior: Behavior normal.     LABORATORY DATA:  CBC    Component Value Date/Time   WBC 2.5 (L) 03/02/2022 0936   WBC 8.1 01/05/2022 1531   RBC 2.55 (L) 03/02/2022 0936   HGB 9.1 (L) 03/02/2022 0936   HCT 26.4 (L) 03/02/2022 0936   PLT  140 (L) 03/02/2022 0936   MCV 103.5 (H) 03/02/2022 0936   MCH 35.7 (H) 03/02/2022 0936   MCHC 34.5 03/02/2022 0936   RDW 14.1 03/02/2022 0936   LYMPHSABS 0.7 03/02/2022 0936   MONOABS 0.2 03/02/2022 0936   EOSABS 0.0 03/02/2022 0936   BASOSABS 0.0 03/02/2022 0936    CMP     Component Value Date/Time   NA 138 03/02/2022 0936   K 4.0 03/02/2022 0936   CL 106 03/02/2022 0936   CO2 29 03/02/2022 0936   GLUCOSE 87 03/02/2022 0936   BUN 14 03/02/2022 0936   CREATININE 0.49 03/02/2022 0936   CALCIUM 9.2 03/02/2022 0936   PROT 6.3 (L) 03/02/2022 0936   ALBUMIN 4.0 03/02/2022 0936   AST 36 03/02/2022 0936   ALT 49 (H) 03/02/2022 0936   ALKPHOS 56 03/02/2022 0936   BILITOT 0.4 03/02/2022 0936   GFRNONAA >60 03/02/2022 0936   GFRAA >60 05/30/2019 1105        ASSESSMENT and THERAPY PLAN:   Malignant neoplasm of upper-outer quadrant of right breast in female, estrogen receptor negative ([facility]) Bobetta is a 56 year old woman with history of stage IIb triple negative breast cancer on neoadjuvant chemotherapy.  Today Jessicamarie will proceed with her weekly Taxol and [place].  She received Keytruda last week.  She does have a mild neuropathy that is becoming more constant therefore we will dose reduce her chemo from 80 mg/m to 65 mg/m.  I sent this message to Dr. Lorenso Courier who is signing off on the chemo.  Haeven also has some mild thrush and I recommended that she restart her Magic mouthwash.  I also sent in an order for fluconazole for her to take the first 3 to 4 days following her chemotherapy treatments.  Ahlaya will return weekly for her treatments and I am going to add appointments in for her to see a provider with each treatment considering her increased side effects that she is beginning to experience.  All questions were answered. The patient knows to call the clinic with any problems, questions or concerns. We can certainly see the patient much sooner if necessary.  Total  encounter time:30 minutes*in face-to-face visit time, chart review, lab review, care coordination, order entry, and documentation of the encounter time.    Wilber Bihari, NP 03/02/22 10:50 AM Medical Oncology and Hematology [facility] [place], [place] [address] Tel. [phone]    Fax. [phone]  *Total Encounter Time as defined by the Centers for Medicare and Medicaid Services includes, in addition to the face-to-face time of a patient visit (documented in the note above) non-face-to-face time: obtaining and reviewing outside history, ordering and reviewing medications, tests or procedures, care coordination (communications with other health care professionals or caregivers) and documentation in the medical record.

## 2022-03-05 ENCOUNTER — Telehealth: Payer: Self-pay | Admitting: Adult Health

## 2022-03-05 NOTE — Telephone Encounter (Signed)
Rescheduled appointments per 11/24 los. Patient is aware of the changes made to her upcoming appointments.

## 2022-03-07 MED FILL — Dexamethasone Sodium Phosphate Inj 100 MG/10ML: INTRAMUSCULAR | Qty: 1 | Status: AC

## 2022-03-08 ENCOUNTER — Encounter: Payer: Self-pay | Admitting: Hematology and Oncology

## 2022-03-08 ENCOUNTER — Inpatient Hospital Stay: Payer: BC Managed Care – PPO

## 2022-03-08 ENCOUNTER — Inpatient Hospital Stay (HOSPITAL_BASED_OUTPATIENT_CLINIC_OR_DEPARTMENT_OTHER): Payer: BC Managed Care – PPO | Admitting: Hematology and Oncology

## 2022-03-08 DIAGNOSIS — Z171 Estrogen receptor negative status [ER-]: Secondary | ICD-10-CM

## 2022-03-08 DIAGNOSIS — Z95828 Presence of other vascular implants and grafts: Secondary | ICD-10-CM

## 2022-03-08 DIAGNOSIS — C50411 Malignant neoplasm of upper-outer quadrant of right female breast: Secondary | ICD-10-CM

## 2022-03-08 LAB — CBC WITH DIFFERENTIAL (CANCER CENTER ONLY)
Abs Immature Granulocytes: 0.01 10*3/uL (ref 0.00–0.07)
Basophils Absolute: 0 10*3/uL (ref 0.0–0.1)
Basophils Relative: 1 %
Eosinophils Absolute: 0 10*3/uL (ref 0.0–0.5)
Eosinophils Relative: 1 %
HCT: 26.8 % — ABNORMAL LOW (ref 36.0–46.0)
Hemoglobin: 9.1 g/dL — ABNORMAL LOW (ref 12.0–15.0)
Immature Granulocytes: 1 %
Lymphocytes Relative: 22 %
Lymphs Abs: 0.4 10*3/uL — ABNORMAL LOW (ref 0.7–4.0)
MCH: 35.7 pg — ABNORMAL HIGH (ref 26.0–34.0)
MCHC: 34 g/dL (ref 30.0–36.0)
MCV: 105.1 fL — ABNORMAL HIGH (ref 80.0–100.0)
Monocytes Absolute: 0.2 10*3/uL (ref 0.1–1.0)
Monocytes Relative: 8 %
Neutro Abs: 1.4 10*3/uL — ABNORMAL LOW (ref 1.7–7.7)
Neutrophils Relative %: 67 %
Platelet Count: 135 10*3/uL — ABNORMAL LOW (ref 150–400)
RBC: 2.55 MIL/uL — ABNORMAL LOW (ref 3.87–5.11)
RDW: 14.1 % (ref 11.5–15.5)
WBC Count: 2 10*3/uL — ABNORMAL LOW (ref 4.0–10.5)
nRBC: 0 % (ref 0.0–0.2)

## 2022-03-08 LAB — CMP (CANCER CENTER ONLY)
ALT: 31 U/L (ref 0–44)
AST: 24 U/L (ref 15–41)
Albumin: 3.9 g/dL (ref 3.5–5.0)
Alkaline Phosphatase: 48 U/L (ref 38–126)
Anion gap: 4 — ABNORMAL LOW (ref 5–15)
BUN: 11 mg/dL (ref 6–20)
CO2: 29 mmol/L (ref 22–32)
Calcium: 9.1 mg/dL (ref 8.9–10.3)
Chloride: 106 mmol/L (ref 98–111)
Creatinine: 0.42 mg/dL — ABNORMAL LOW (ref 0.44–1.00)
GFR, Estimated: >60 mL/min (ref >60)
Glucose, Bld: 100 mg/dL — ABNORMAL HIGH (ref 70–99)
Potassium: 3.8 mmol/L (ref 3.5–5.1)
Sodium: 139 mmol/L (ref 135–145)
Total Bilirubin: 0.4 mg/dL (ref 0.3–1.2)
Total Protein: 6.3 g/dL — ABNORMAL LOW (ref 6.5–8.1)

## 2022-03-08 MED ORDER — SODIUM CHLORIDE 0.9 % IV SOLN
50.0000 mg/m2 | Freq: Once | INTRAVENOUS | Status: AC
  Administered 2022-03-08: 90 mg via INTRAVENOUS
  Filled 2022-03-08: qty 15

## 2022-03-08 MED ORDER — PALONOSETRON HCL INJECTION 0.25 MG/5ML
0.2500 mg | Freq: Once | INTRAVENOUS | Status: AC
  Administered 2022-03-08: 0.25 mg via INTRAVENOUS
  Filled 2022-03-08: qty 5

## 2022-03-08 MED ORDER — CETIRIZINE HCL 10 MG/ML IV SOLN
10.0000 mg | Freq: Once | INTRAVENOUS | Status: AC
  Administered 2022-03-08: 10 mg via INTRAVENOUS
  Filled 2022-03-08: qty 1

## 2022-03-08 MED ORDER — SODIUM CHLORIDE 0.9 % IV SOLN: Freq: Once | INTRAVENOUS | Status: AC

## 2022-03-08 MED ORDER — SODIUM CHLORIDE 0.9% FLUSH
10.0000 mL | Freq: Once | INTRAVENOUS | Status: AC
  Administered 2022-03-08: 10 mL

## 2022-03-08 MED ORDER — HEPARIN SOD (PORK) LOCK FLUSH 100 UNIT/ML IV SOLN
500.0000 [IU] | Freq: Once | INTRAVENOUS | Status: AC | PRN
  Administered 2022-03-08: 500 [IU]

## 2022-03-08 MED ORDER — SODIUM CHLORIDE 0.9% FLUSH
10.0000 mL | INTRAVENOUS | Status: DC | PRN
  Administered 2022-03-08: 10 mL

## 2022-03-08 MED ORDER — FAMOTIDINE IN NACL 20-0.9 MG/50ML-% IV SOLN
20.0000 mg | Freq: Once | INTRAVENOUS | Status: AC
  Administered 2022-03-08: 20 mg via INTRAVENOUS
  Filled 2022-03-08: qty 50

## 2022-03-08 MED ORDER — SODIUM CHLORIDE 0.9 % IV SOLN
10.0000 mg | Freq: Once | INTRAVENOUS | Status: AC
  Administered 2022-03-08: 10 mg via INTRAVENOUS
  Filled 2022-03-08: qty 10

## 2022-03-08 MED ORDER — SODIUM CHLORIDE 0.9 % IV SOLN
165.3000 mg | Freq: Once | INTRAVENOUS | Status: AC
  Administered 2022-03-08: 170 mg via INTRAVENOUS
  Filled 2022-03-08: qty 17

## 2022-03-08 NOTE — Progress Notes (Signed)
Per Dr. Chryl Heck - okay to proceed with treatment with [place] of 1.4.

## 2022-03-08 NOTE — Progress Notes (Signed)
[name] PROGRESS NOTE  Patient Care Team: Rosine Door as PCP - General (Physician Assistant) Rockwell Germany, RN as Oncology Nurse Navigator Mauro Kaufmann, RN as Oncology Nurse Navigator Jovita Kussmaul, MD as Consulting Physician (General Surgery) Benay Pike, MD as Consulting Physician (Hematology and Oncology) Kyung Rudd, MD as Consulting Physician (Radiation Oncology)  CHIEF COMPLAINTS:  Breast cancer  SUMMARY OF ONCOLOGIC HISTORY: Oncology History  Malignant neoplasm of upper-outer quadrant of right breast in female, estrogen receptor negative ([place])  09/25/2021 Imaging   Bilateral screening mammogram with irregular hypoechoic mass in the right breast at 12:00 axis, 5 cm from nipple measuring 2.5 cm corresponding to the palpable area of concern. Ultrasound confirmed irregular hypoechoic mass in the right breast at the 12:00 5 cm from the nipple measuring 2.5 cm corresponding to the palpable area of concern.   09/26/2021 Pathology Results   Right breast needle core biopsy showed invasive ductal carcinoma, high-grade prognostic showed ER 0% negative PR 0% negative Ki-67 of 95% and HER2 negative   09/28/2021 Initial Diagnosis   Malignant neoplasm of upper-outer quadrant of right breast in female, estrogen receptor negative ([place])   10/16/2021 Genetic Testing   Negative hereditary cancer genetic testing: no pathogenic variants detected in [facility] CustomNext-Cancer +RNAinsight Panel.  Report date is October 16, 2021.   The CustomNext-Cancer+RNAinsight panel offered by [facility] includes sequencing and rearrangement analysis for the following 47 genes:  APC, ATM, AXIN2, BARD1, BMPR1A, BRCA1, BRCA2, BRIP1, CDH1, CDK4, CDKN2A, CHEK2, DICER1, EPCAM, GREM1, HOXB13, MEN1, MLH1, MSH2, MSH3, MSH6, MUTYH, NBN, NF1, NF2, NTHL1, PALB2, PMS2, POLD1, POLE, PTEN, RAD51C, RAD51D, RECQL, RET, SDHA, SDHAF2, SDHB, SDHC, SDHD, SMAD4, SMARCA4, STK11, TP53, TSC1, TSC2, and  VHL.  RNA data is routinely analyzed for use in variant interpretation for all genes.    10/20/2021 - 12/02/2021 Chemotherapy   Patient is on Treatment Plan : BREAST  Pembrolizumab (200) D1 + AC D1 q21d x 4 cycles / Pembrolizumab (200) D1 + Carboplatin (1.5) D1,8,15 + Paclitaxel (80) D1,8,15 q21d X 4 cycles     10/20/2021 -  Chemotherapy   Patient is on Treatment Plan : BREAST Pembrolizumab (200) D1 + AC D1 q21d x 4 cycles / Pembrolizumab (200) D1 + Carboplatin (1.5) D1,8,15 + Paclitaxel (80) D1,8,15 q21d X 4 cycles      INTERVAL HISTORY:  Wendy Webb returns for follow-up before  carbotaxol. She is now on weekly CarboTaxol with Beryle Flock and has been tolerating it very well.  She has been feeling well overall, since we dose reduced, neuropathy in her feet feels better. She however noted some proximal myalgias, some oral thrush since last visit. She still can't feel any breast mass which is great.  Rest of the pertinent 10 point ROS reviewed and negative.  MEDICAL HISTORY:  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[place])    09/26/2021 (right breast biopsy)   Mild mitral regurgitation 06/2019   Palpitations    PONV (postoperative nausea and vomiting)     SURGICAL HISTORY: Past Surgical History:  Procedure Laterality Date   CHOLECYSTECTOMY     GALLBLADDER SURGERY     PORTACATH PLACEMENT Left 10/18/2021   Procedure: PLA CEMENT OF PORT;  Surgeon: Jovita Kussmaul, MD;  Location: [facility];  Service: General;  Laterality: Left;    SOCIAL HISTORY: Social  History   Socioeconomic History   Marital status: Married    Spouse name: Not on file   Number of children: 2   Years of education: Not on file   Highest education level: Not on file  Occupational History    Comment: Hair dresser  Tobacco Use   Smoking status: Never   Smokeless tobacco: Never  Vaping  Use   Vaping Use: Never used  Substance and Sexual Activity   Alcohol use: Yes    Alcohol/week: 0.0 standard drinks of alcohol    Comment: Glass wine per day   Drug use: No   Sexual activity: Yes    Partners: Male  Other Topics Concern   Not on file  Social History Narrative   Pt is a hair stylist   Married (3rd marriage)   2 sons (both grown)   Enjoys yard work, hiking, Scientist, clinical (histocompatibility and immunogenetics)   Complete 2 year cosmetology   Has one Neurosurgeon   Social Determinants of Radio broadcast assistant Strain: Not on Art therapist Insecurity: Not on file  Transportation Needs: Not on file  Physical Activity: Not on file  Stress: Not on file  Social Connections: Not on file  Intimate Partner Violence: Not on file    FAMILY HISTORY: Family History  Problem Relation Age of Onset   Hypertension Mother    Heart attack Mother    Heart disease Mother        Atrial fibrillation   Obstructive Sleep Apnea Mother    Hypertension Brother    Stroke Maternal Grandmother 75   Breast cancer Other 43       PGM's sister   Colon cancer Neg Hx    Esophageal cancer Neg Hx     ALLERGIES:  is allergic to ace inhibitors and shellfish allergy.  MEDICATIONS:  Current Outpatient Medications  Medication Sig Dispense Refill   cholecalciferol (VITAMIN D3) 25 MCG (1000 UNIT) tablet Take 1,000 Units by mouth daily.     cyanocobalamin 1000 MCG tablet Take 1,000 mcg by mouth daily.     dexamethasone (DECADRON) 4 MG tablet Take 1 tablets once a day for 3 days after carboplatin and AC chemotherapy. Take with food. 30 tablet 1   fluconazole (DIFLUCAN) 100 MG tablet Take 1 tablet (100 mg total) by mouth daily. 30 tablet 0   lidocaine-prilocaine (EMLA) cream Apply to affected area once 30 g 3   LORazepam (ATIVAN) 0.5 MG tablet Take 0.25 mg by mouth every morning.     magic mouthwash w/lidocaine SOLN 1:1:1 ratio of lidocaine 2%,maalox,diphenhydramine 12.2m/5ml 5-10 cc swish swallow or spit QID prn 240 mL 2   Misc Natural  Products (TART CHERRY ADVANCED PO) Take 1 tablet by mouth daily. (Patient not taking: Reported on 01/04/2022)     Multiple Vitamins-Minerals (MULTIVIT/MULTIMINERAL ADULT) LIQD Take 30 mLs by mouth daily.     PREVIDENT 5000 BOOSTER PLUS 1.1 % PSTE Place onto teeth as directed.     prochlorperazine (COMPAZINE) 10 MG tablet Take 1 tablet (10 mg total) by mouth every 6 (six) hours as needed (Nausea or vomiting). (Patient not taking: Reported on 01/04/2022) 30 tablet 1   No current facility-administered medications for this visit.    REVIEW OF SYSTEMS:   Constitutional: Denies fevers, chills or abnormal night sweats Eyes: Denies blurriness of vision, double vision or watery eyes Ears, nose, mouth, throat, and face: Denies mucositis or sore throat Respiratory: Denies cough, dyspnea or wheezes Cardiovascular: Denies palpitation, chest discomfort or lower extremity swelling  Gastrointestinal:  Denies nausea, heartburn or change in bowel habits Skin: Denies abnormal skin rashes Lymphatics: Denies new lymphadenopathy or easy bruising Neurological:Denies numbness, tingling or new weaknesses Behavioral/Psych: Mood is stable, no new changes    PHYSICAL EXAMINATION: ECOG PERFORMANCE STATUS: 0 - Asymptomatic  Vitals:   03/08/22 0842  BP: 135/76  Pulse: 86  Resp: 18  Temp: (!) 97.3 F (36.3 C)  SpO2: 100%   Filed Weights   03/08/22 0842  Weight: 155 lb 3 oz (70.4 kg)    Physical Exam Constitutional:      Appearance: Normal appearance.  Chest:       Comments: Complete clinical response.  Mass not palpable.  No palpable regional adenopathy. Musculoskeletal:        General: No swelling.     Cervical back: Normal range of motion and neck supple. No rigidity.  Neurological:     Mental Status: She is alert.      LABORATORY DATA:  I have reviewed the data as listed Lab Results  Component Value Date   WBC 2.0 (L) 03/08/2022   HGB 9.1 (L) 03/08/2022   HCT 26.8 (L) 03/08/2022   MCV  105.1 (H) 03/08/2022   PLT 135 (L) 03/08/2022   Lab Results  Component Value Date   NA 138 03/02/2022   K 4.0 03/02/2022   CL 106 03/02/2022   CO2 29 03/02/2022    RADIOGRAPHIC STUDIES: I have personally reviewed the radiological reports and agreed with the findings in the report.  ASSESSMENT AND PLAN:  Wendy Webb is a 56 y.o. 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  #Invasive poorly differentiated adenocarcinoma of the right breast- grade 3, triple negative, high proliferation index of 95%: --Currently receiving neoadjuvant chemo immunotherapy --She is here before planned weekly cycle of CarboTaxol.  Since last visit, we dose reduced her Taxol because of worsening neuropathy which has gotten better however persists.  She now complains of some proximal myalgias, thrush today.  We have discussed about dose reducing the Taxol to 50 mg/m for better tolerance.  She has complete clinical response on exam.  She will continue treatment as planned, anticipate completion of chemo in December.  She will follow-up with the surgery team after that and return to clinic for adjuvant immunotherapy. --Okay to proceed with chemo if labs are all within parameters.  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   Benay Pike MD

## 2022-03-08 NOTE — Patient Instructions (Signed)
[facility] MEDICAL ONCOLOGY   Discharge Instructions: Thank you for choosing [facility] to provide your oncology and hematology care.   If you have a lab appointment with the Cancer Center, please go directly to the Cancer Center and check in at the registration area.   Wear comfortable clothing and clothing appropriate for easy access to any Portacath or PICC line.   We strive to give you quality time with your provider. You may need to reschedule your appointment if you arrive late (15 or more minutes).  Arriving late affects you and other patients whose appointments are after yours.  Also, if you miss three or more appointments without notifying the office, you may be dismissed from the clinic at the provider's discretion.      For prescription refill requests, have your pharmacy contact our office and allow 72 hours for refills to be completed.    Today you received the following chemotherapy and/or immunotherapy agents: Paclitaxel (Taxol) and Carboplatin       To help prevent nausea and vomiting after your treatment, we encourage you to take your nausea medication as directed.  BELOW ARE SYMPTOMS THAT SHOULD BE REPORTED IMMEDIATELY: *FEVER GREATER THAN 100.4 F (38 C) OR HIGHER *CHILLS OR SWEATING *NAUSEA AND VOMITING THAT IS NOT CONTROLLED WITH YOUR NAUSEA MEDICATION *UNUSUAL SHORTNESS OF BREATH *UNUSUAL BRUISING OR BLEEDING *URINARY PROBLEMS (pain or burning when urinating, or frequent urination) *BOWEL PROBLEMS (unusual diarrhea, constipation, pain near the anus) TENDERNESS IN MOUTH AND THROAT WITH OR WITHOUT PRESENCE OF ULCERS (sore throat, sores in mouth, or a toothache) UNUSUAL RASH, SWELLING OR PAIN  UNUSUAL VAGINAL DISCHARGE OR ITCHING   Items with * indicate a potential emergency and should be followed up as soon as possible or go to the Emergency Department if any problems should occur.  Please show the CHEMOTHERAPY ALERT CARD or  IMMUNOTHERAPY ALERT CARD at check-in to the Emergency Department and triage nurse.  Should you have questions after your visit or need to cancel or reschedule your appointment, please contact [facility] MEDICAL ONCOLOGY  Dept: [phone]  and follow the prompts.  Office hours are 8:00 a.m. to 4:30 p.m. Monday - Friday. Please note that voicemails left after 4:00 p.m. may not be returned until the following business day.  We are closed weekends and major holidays. You have access to a nurse at all times for urgent questions. Please call the main number to the clinic Dept: [phone] and follow the prompts.   For any non-urgent questions, you may also contact your provider using MyChart. We now offer e-Visits for anyone 18 and older to request care online for non-urgent symptoms. For details visit [email or web address].   Also download the MyChart app! Go to the app store, search "MyChart", open the app, select [facility], and log in with your MyChart username and password.  Masks are optional in the cancer centers. If you would like for your care team to wear a mask while they are taking care of you, please let them know. You may have one support person who is at least 55 years old accompany you for your appointments. 

## 2022-03-09 ENCOUNTER — Inpatient Hospital Stay: Payer: BC Managed Care – PPO | Attending: Hematology and Oncology

## 2022-03-09 VITALS — BP 130/74 | HR 98 | Temp 98.1°F | Resp 18

## 2022-03-09 DIAGNOSIS — Z803 Family history of malignant neoplasm of breast: Secondary | ICD-10-CM | POA: Insufficient documentation

## 2022-03-09 DIAGNOSIS — I10 Essential (primary) hypertension: Secondary | ICD-10-CM | POA: Insufficient documentation

## 2022-03-09 DIAGNOSIS — C50411 Malignant neoplasm of upper-outer quadrant of right female breast: Secondary | ICD-10-CM

## 2022-03-09 DIAGNOSIS — I340 Nonrheumatic mitral (valve) insufficiency: Secondary | ICD-10-CM | POA: Diagnosis not present

## 2022-03-09 DIAGNOSIS — T451X5A Adverse effect of antineoplastic and immunosuppressive drugs, initial encounter: Secondary | ICD-10-CM | POA: Insufficient documentation

## 2022-03-09 DIAGNOSIS — Z5112 Encounter for antineoplastic immunotherapy: Secondary | ICD-10-CM | POA: Diagnosis not present

## 2022-03-09 DIAGNOSIS — K589 Irritable bowel syndrome without diarrhea: Secondary | ICD-10-CM | POA: Diagnosis not present

## 2022-03-09 DIAGNOSIS — Z5189 Encounter for other specified aftercare: Secondary | ICD-10-CM | POA: Diagnosis not present

## 2022-03-09 DIAGNOSIS — Z171 Estrogen receptor negative status [ER-]: Secondary | ICD-10-CM | POA: Insufficient documentation

## 2022-03-09 DIAGNOSIS — G620 Drug-induced polyneuropathy: Secondary | ICD-10-CM | POA: Diagnosis not present

## 2022-03-09 MED ORDER — FILGRASTIM-AAFI 300 MCG/0.5ML IJ SOSY
300.0000 ug | PREFILLED_SYRINGE | Freq: Once | INTRAMUSCULAR | Status: AC
  Administered 2022-03-09: 300 ug via SUBCUTANEOUS
  Filled 2022-03-09: qty 0.5

## 2022-03-10 ENCOUNTER — Inpatient Hospital Stay: Payer: BC Managed Care – PPO

## 2022-03-10 VITALS — BP 114/62 | HR 83 | Temp 97.0°F | Resp 16

## 2022-03-10 DIAGNOSIS — C50411 Malignant neoplasm of upper-outer quadrant of right female breast: Secondary | ICD-10-CM

## 2022-03-10 MED ORDER — FILGRASTIM-AAFI 300 MCG/0.5ML IJ SOSY
300.0000 ug | PREFILLED_SYRINGE | Freq: Once | INTRAMUSCULAR | Status: AC
  Administered 2022-03-10: 300 ug via SUBCUTANEOUS
  Filled 2022-03-10: qty 0.5

## 2022-03-10 NOTE — Patient Instructions (Signed)
Filgrastim Injection What is this medication? FILGRASTIM (fil GRA stim) lowers the risk of infection in people who are receiving chemotherapy. It works by helping your body make more white blood cells, which protects your body from infection. It may also be used to help people who have been exposed to high doses of radiation. It can be used to help prepare your body before a stem cell transplant. It works by helping your bone marrow make and release stem cells into the blood. This medicine may be used for other purposes; ask your health care provider or pharmacist if you have questions. COMMON BRAND NAME(S): Neupogen, Nivestym, Releuko, Zarxio What should I tell my care team before I take this medication? They need to know if you have any of these conditions: History of blood diseases, such as sickle cell anemia Kidney disease Recent or ongoing radiation An unusual or allergic reaction to filgrastim, pegfilgrastim, latex, rubber, other medications, foods, dyes, or preservatives Pregnant or trying to get pregnant Breast-feeding How should I use this medication? This medication is injected under the skin or into a vein. It is usually given by your care team in a hospital or clinic setting. It may be given at home. If you get this medication at home, you will be taught how to prepare and give it. Use exactly as directed. Take it as directed on the prescription label at the same time every day. Keep taking it unless your care team tells you to stop. It is important that you put your used needles and syringes in a special sharps container. Do not put them in a trash can. If you do not have a sharps container, call your pharmacist or care team to get one. This medication comes with INSTRUCTIONS FOR USE. Ask your pharmacist for directions on how to use this medication. Read the information carefully. Talk to your pharmacist or care team if you have questions. Talk to your care team about the use of this  medication in children. While it may be prescribed for children for selected conditions, precautions do apply. Overdosage: If you think you have taken too much of this medicine contact a poison control center or emergency room at once. NOTE: This medicine is only for you. Do not share this medicine with others. What if I miss a dose? It is important not to miss any doses. Talk to your care team about what to do if you miss a dose. What may interact with this medication? Medications that may cause a release of neutrophils, such as lithium This list may not describe all possible interactions. Give your health care provider a list of all the medicines, herbs, non-prescription drugs, or dietary supplements you use. Also tell them if you smoke, drink alcohol, or use illegal drugs. Some items may interact with your medicine. What should I watch for while using this medication? Your condition will be monitored carefully while you are receiving this medication. You may need bloodwork while taking this medication. Talk to your care team about your risk of cancer. You may be more at risk for certain types of cancer if you take this medication. What side effects may I notice from receiving this medication? Side effects that you should report to your care team as soon as possible: Allergic reactions--skin rash, itching, hives, swelling of the face, lips, tongue, or throat Capillary leak syndrome--stomach or muscle pain, unusual weakness or fatigue, feeling faint or lightheaded, decrease in the amount of urine, swelling of the ankles, hands, or   feet, trouble breathing High white blood cell level--fever, fatigue, trouble breathing, night sweats, change in vision, weight loss Inflammation of the aorta--fever, fatigue, back, chest, or stomach pain, severe headache Kidney injury (glomerulonephritis)--decrease in the amount of urine, red or dark brown urine, foamy or bubbly urine, swelling of the ankles, hands, or  feet Shortness of breath or trouble breathing Spleen injury--pain in upper left stomach or shoulder Unusual bruising or bleeding Side effects that usually do not require medical attention (report to your care team if they continue or are bothersome): Back pain Bone pain Fatigue Fever Headache Nausea This list may not describe all possible side effects. Call your doctor for medical advice about side effects. You may report side effects to FDA at 1-800-FDA-1088. Where should I keep my medication? Keep out of the reach of children and pets. Keep this medication in the original packaging until you are ready to take it. Protect from light. See product for storage information. Each product may have different instructions. Get rid of any unused medication after the expiration date. To get rid of medications that are no longer needed or have expired: Take the medication to a medications take-back program. Check with your pharmacy or law enforcement to find a location. If you cannot return the medication, ask your pharmacist or care team how to get rid of this medication safely. NOTE: This sheet is a summary. It may not cover all possible information. If you have questions about this medicine, talk to your doctor, pharmacist, or health care provider.  2023 Elsevier/Gold Standard (2021-07-04 00:00:00)  

## 2022-03-12 ENCOUNTER — Other Ambulatory Visit: Payer: Self-pay

## 2022-03-12 ENCOUNTER — Inpatient Hospital Stay: Payer: BC Managed Care – PPO

## 2022-03-12 VITALS — BP 131/77 | HR 92 | Temp 98.3°F | Resp 18

## 2022-03-12 DIAGNOSIS — C50411 Malignant neoplasm of upper-outer quadrant of right female breast: Secondary | ICD-10-CM | POA: Diagnosis not present

## 2022-03-12 MED ORDER — FILGRASTIM-AAFI 300 MCG/0.5ML IJ SOSY
300.0000 ug | PREFILLED_SYRINGE | Freq: Once | INTRAMUSCULAR | Status: AC
  Administered 2022-03-12: 300 ug via SUBCUTANEOUS
  Filled 2022-03-12: qty 0.5

## 2022-03-15 MED FILL — Dexamethasone Sodium Phosphate Inj 100 MG/10ML: INTRAMUSCULAR | Qty: 1 | Status: AC

## 2022-03-16 ENCOUNTER — Telehealth: Payer: Self-pay | Admitting: *Deleted

## 2022-03-16 ENCOUNTER — Inpatient Hospital Stay: Payer: BC Managed Care – PPO

## 2022-03-16 ENCOUNTER — Inpatient Hospital Stay (HOSPITAL_BASED_OUTPATIENT_CLINIC_OR_DEPARTMENT_OTHER): Payer: BC Managed Care – PPO | Admitting: Adult Health

## 2022-03-16 ENCOUNTER — Encounter: Payer: Self-pay | Admitting: *Deleted

## 2022-03-16 ENCOUNTER — Encounter: Payer: Self-pay | Admitting: Adult Health

## 2022-03-16 DIAGNOSIS — C50411 Malignant neoplasm of upper-outer quadrant of right female breast: Secondary | ICD-10-CM

## 2022-03-16 DIAGNOSIS — Z171 Estrogen receptor negative status [ER-]: Secondary | ICD-10-CM

## 2022-03-16 DIAGNOSIS — Z95828 Presence of other vascular implants and grafts: Secondary | ICD-10-CM

## 2022-03-16 LAB — CBC WITH DIFFERENTIAL (CANCER CENTER ONLY)
Abs Immature Granulocytes: 0.03 10*3/uL (ref 0.00–0.07)
Basophils Absolute: 0 10*3/uL (ref 0.0–0.1)
Basophils Relative: 1 %
Eosinophils Absolute: 0 10*3/uL (ref 0.0–0.5)
Eosinophils Relative: 1 %
HCT: 27.7 % — ABNORMAL LOW (ref 36.0–46.0)
Hemoglobin: 9.5 g/dL — ABNORMAL LOW (ref 12.0–15.0)
Immature Granulocytes: 1 %
Lymphocytes Relative: 22 %
Lymphs Abs: 0.8 10*3/uL (ref 0.7–4.0)
MCH: 36.5 pg — ABNORMAL HIGH (ref 26.0–34.0)
MCHC: 34.3 g/dL (ref 30.0–36.0)
MCV: 106.5 fL — ABNORMAL HIGH (ref 80.0–100.0)
Monocytes Absolute: 0.6 10*3/uL (ref 0.1–1.0)
Monocytes Relative: 17 %
Neutro Abs: 2 10*3/uL (ref 1.7–7.7)
Neutrophils Relative %: 58 %
Platelet Count: 164 10*3/uL (ref 150–400)
RBC: 2.6 MIL/uL — ABNORMAL LOW (ref 3.87–5.11)
RDW: 15.4 % (ref 11.5–15.5)
WBC Count: 3.4 10*3/uL — ABNORMAL LOW (ref 4.0–10.5)
nRBC: 0 % (ref 0.0–0.2)

## 2022-03-16 LAB — CMP (CANCER CENTER ONLY)
ALT: 40 U/L (ref 0–44)
AST: 33 U/L (ref 15–41)
Albumin: 3.7 g/dL (ref 3.5–5.0)
Alkaline Phosphatase: 58 U/L (ref 38–126)
Anion gap: 4 — ABNORMAL LOW (ref 5–15)
BUN: 10 mg/dL (ref 6–20)
CO2: 29 mmol/L (ref 22–32)
Calcium: 9.1 mg/dL (ref 8.9–10.3)
Chloride: 105 mmol/L (ref 98–111)
Creatinine: 0.48 mg/dL (ref 0.44–1.00)
GFR, Estimated: >60 mL/min (ref >60)
Glucose, Bld: 106 mg/dL — ABNORMAL HIGH (ref 70–99)
Potassium: 3.9 mmol/L (ref 3.5–5.1)
Sodium: 138 mmol/L (ref 135–145)
Total Bilirubin: 0.3 mg/dL (ref 0.3–1.2)
Total Protein: 5.9 g/dL — ABNORMAL LOW (ref 6.5–8.1)

## 2022-03-16 LAB — TSH: TSH: 2.288 u[IU]/mL (ref 0.350–4.500)

## 2022-03-16 MED ORDER — SODIUM CHLORIDE 0.9% FLUSH
10.0000 mL | INTRAVENOUS | Status: DC | PRN
  Administered 2022-03-16: 10 mL

## 2022-03-16 MED ORDER — SODIUM CHLORIDE 0.9 % IV SOLN
200.0000 mg | Freq: Once | INTRAVENOUS | Status: AC
  Administered 2022-03-16: 200 mg via INTRAVENOUS
  Filled 2022-03-16: qty 200

## 2022-03-16 MED ORDER — HEPARIN SOD (PORK) LOCK FLUSH 100 UNIT/ML IV SOLN
500.0000 [IU] | Freq: Once | INTRAVENOUS | Status: AC | PRN
  Administered 2022-03-16: 500 [IU]

## 2022-03-16 MED ORDER — SODIUM CHLORIDE 0.9 % IV SOLN: Freq: Once | INTRAVENOUS | Status: AC

## 2022-03-16 MED ORDER — SODIUM CHLORIDE 0.9% FLUSH
10.0000 mL | Freq: Once | INTRAVENOUS | Status: AC
  Administered 2022-03-16: 10 mL

## 2022-03-16 NOTE — Telephone Encounter (Signed)
Called pt to make aware of MRI appt on 03/26/2022 at 1pm. Advised to arrive at 12:30 pm. Pt verbalized understanding.

## 2022-03-16 NOTE — Progress Notes (Signed)
[facility] Follow up:    Wendy Kaufman, PA-C [address] [place] [address]   DIAGNOSIS:  Cancer Staging  Malignant neoplasm of upper-outer quadrant of right breast in female, estrogen receptor negative ([place]) Staging form: Breast, AJCC 8th Edition - Clinical stage from 10/02/2021: Stage IIB (cT2, cN0, cM0, G3, ER-, PR-, HER2-) - Unsigned Stage prefix: Initial diagnosis Method of lymph node assessment: Clinical Histologic grading system: 3 grade system   SUMMARY OF ONCOLOGIC HISTORY: Oncology History  Malignant neoplasm of upper-outer quadrant of right breast in female, estrogen receptor negative ([place])  09/25/2021 Imaging   Bilateral screening mammogram with irregular hypoechoic mass in the right breast at 12:00 axis, 5 cm from nipple measuring 2.5 cm corresponding to the palpable area of concern. Ultrasound confirmed irregular hypoechoic mass in the right breast at the 12:00 5 cm from the nipple measuring 2.5 cm corresponding to the palpable area of concern.   09/26/2021 Pathology Results   Right breast needle core biopsy showed invasive ductal carcinoma, high-grade prognostic showed ER 0% negative PR 0% negative Ki-67 of 95% and HER2 negative   09/28/2021 Initial Diagnosis   Malignant neoplasm of upper-outer quadrant of right breast in female, estrogen receptor negative ([place])   10/16/2021 Genetic Testing   Negative hereditary cancer genetic testing: no pathogenic variants detected in [facility] CustomNext-Cancer +RNAinsight Panel.  Report date is October 16, 2021.   The CustomNext-Cancer+RNAinsight panel offered by [facility] includes sequencing and rearrangement analysis for the following 47 genes:  APC, ATM, AXIN2, BARD1, BMPR1A, BRCA1, BRCA2, BRIP1, CDH1, CDK4, CDKN2A, CHEK2, DICER1, EPCAM, GREM1, HOXB13, MEN1, MLH1, MSH2, MSH3, MSH6, MUTYH, NBN, NF1, NF2, NTHL1, PALB2, PMS2, POLD1, POLE, PTEN, RAD51C, RAD51D, RECQL, RET, SDHA, SDHAF2, SDHB, SDHC, SDHD,  SMAD4, SMARCA4, STK11, TP53, TSC1, TSC2, and VHL.  RNA data is routinely analyzed for use in variant interpretation for all genes.    10/20/2021 - 12/02/2021 Chemotherapy   Patient is on Treatment Plan : BREAST  Pembrolizumab (200) D1 + AC D1 q21d x 4 cycles / Pembrolizumab (200) D1 + Carboplatin (1.5) D1,8,15 + Paclitaxel (80) D1,8,15 q21d X 4 cycles     10/20/2021 -  Chemotherapy   Patient is on Treatment Plan : BREAST Pembrolizumab (200) D1 + AC D1 q21d x 4 cycles / Pembrolizumab (200) D1 + Carboplatin (1.5) D1,8,15 + Paclitaxel (80) D1,8,15 q21d X 4 cycles       CURRENT THERAPY: Neoadjuvant chemotherapy  INTERVAL HISTORY: Wendy Webb 56 y.o. female returns for follow-up and evaluation prior to receiving Taxol [place] and [place].  She notes that since receiving her treatment including dose reduced Taxol at 50 mg/m a week ago her neuropathy has worsened and is in her toes extending to the ball of her foot and also in her fingertips.  She notes these changes are sensory only and denies any motor deficits at this point.  She is also experienced some achiness and fatigue in the muscles in her legs along with an ocular migraine.  She felt reassured by her visit with Dr. Chryl Heck previously that the tumor has continued to decrease in size.   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[place])    09/26/2021 (right breast biopsy)   Mild mitral regurgitation 06/2019   Palpitations    PONV (postoperative nausea and vomiting)     SURGICAL HISTORY: Past Surgical History:  Procedure Laterality Date   CHOLECYSTECTOMY     GALLBLADDER SURGERY     PORTACATH PLACEMENT Left 10/18/2021   Procedure: PLA CEMENT OF PORT;  Surgeon: Jovita Kussmaul, MD;  Location[facility] OR;  Service: General;  Laterality: Left;    SOCIAL HISTORY: Social History   Socioeconomic History   Marital status: Married    Spouse name: Not on file   Number of children: 2   Years of education: Not on file   Highest education level: Not on file  Occupational History    Comment: Hair dresser  Tobacco Use   Smoking status: Never   Smokeless tobacco: Never  Vaping Use   Vaping Use: Never used  Substance and Sexual Activity   Alcohol use: Yes    Alcohol/week: 0.0 standard drinks of alcohol    Comment: Glass wine per day   Drug use: No   Sexual activity: Yes    Partners: Male  Other Topics Concern   Not on file  Social History Narrative   Pt is a hair stylist   Married (3rd marriage)   2 sons (both grown)   Enjoys yard work, hiking, Scientist, clinical (histocompatibility and immunogenetics)   Complete 2 year cosmetology   Has one Neurosurgeon   Social Determinants of Radio broadcast assistant Strain: Not on Art therapist Insecurity: Not on file  Transportation Needs: Not on file  Physical Activity: Not on file  Stress: Not on file  Social Connections: Not on file  Intimate Partner Violence: Not on file    FAMILY HISTORY: Family History  Problem Relation Age of Onset   Hypertension Mother    Heart attack Mother    Heart disease Mother        Atrial fibrillation   Obstructive Sleep Apnea Mother    Hypertension Brother    Stroke Maternal Grandmother 75   Breast cancer Other 60       PGM's sister   Colon cancer Neg Hx    Esophageal cancer Neg Hx     Review of Systems   Constitutional:  Positive for fatigue. Negative for appetite change, chills, fever and unexpected weight change.  HENT:   Negative for hearing loss, lump/mass and trouble swallowing.   Eyes:  Negative for eye problems and icterus.  Respiratory:  Negative for chest tightness, cough and shortness of breath.   Cardiovascular:  Negative for chest pain, leg swelling and palpitations.  Gastrointestinal:  Negative for abdominal distention, abdominal pain, constipation, diarrhea, nausea and vomiting.  Endocrine: Negative for hot flashes.  Genitourinary:  Negative for difficulty urinating.   Musculoskeletal:  Positive for myalgias (quadricep muscles as noted in interval history). Negative for arthralgias.  Skin:  Negative for itching and rash.  Neurological:  Negative for dizziness, extremity weakness, headaches and numbness.  Hematological:  Negative for adenopathy. Does not bruise/bleed easily.  Psychiatric/Behavioral:  Negative for depression. The patient is not nervous/anxious.  PHYSICAL EXAMINATION  ECOG PERFORMANCE STATUS: 1 - Symptomatic but completely ambulatory  Vitals:   03/16/22 1016  BP: 135/77  Pulse: 74  Resp: 14  Temp: (!) 97 F (36.1 C)  SpO2: 100%    Physical Exam Constitutional:      General: She is not in acute distress.    Appearance: Normal appearance. She is not toxic-appearing.  HENT:     Head: Normocephalic and atraumatic.  Eyes:     General: No scleral icterus. Cardiovascular:     Rate and Rhythm: Normal rate and regular rhythm.     Pulses: Normal pulses.     Heart sounds: Normal heart sounds.  Pulmonary:     Effort: Pulmonary effort is normal.     Breath sounds: Normal breath sounds.  Abdominal:     General: Abdomen is flat. Bowel sounds are normal. There is no distension.     Palpations: Abdomen is soft.     Tenderness: There is no abdominal tenderness.  Musculoskeletal:        General: No swelling.     Cervical back: Neck supple.   Lymphadenopathy:     Cervical: No cervical adenopathy.  Skin:    General: Skin is warm and dry.     Findings: No rash.  Neurological:     General: No focal deficit present.     Mental Status: She is alert.  Psychiatric:        Mood and Affect: Mood normal.        Behavior: Behavior normal.     LABORATORY DATA:  CBC    Component Value Date/Time   WBC 3.4 (L) 03/16/2022 0959   WBC 8.1 01/05/2022 1531   RBC 2.60 (L) 03/16/2022 0959   HGB 9.5 (L) 03/16/2022 0959   HCT 27.7 (L) 03/16/2022 0959   PLT 164 03/16/2022 0959   MCV 106.5 (H) 03/16/2022 0959   MCH 36.5 (H) 03/16/2022 0959   MCHC 34.3 03/16/2022 0959   RDW 15.4 03/16/2022 0959   LYMPHSABS 0.8 03/16/2022 0959   MONOABS 0.6 03/16/2022 0959   EOSABS 0.0 03/16/2022 0959   BASOSABS 0.0 03/16/2022 0959    CMP     Component Value Date/Time   NA 138 03/16/2022 0959   K 3.9 03/16/2022 0959   CL 105 03/16/2022 0959   CO2 29 03/16/2022 0959   GLUCOSE 106 (H) 03/16/2022 0959   BUN 10 03/16/2022 0959   CREATININE 0.48 03/16/2022 0959   CALCIUM 9.1 03/16/2022 0959   PROT 5.9 (L) 03/16/2022 0959   ALBUMIN 3.7 03/16/2022 0959   AST 33 03/16/2022 0959   ALT 40 03/16/2022 0959   ALKPHOS 58 03/16/2022 0959   BILITOT 0.3 03/16/2022 0959   GFRNONAA >60 03/16/2022 0959   GFRAA >60 05/30/2019 1105        ASSESSMENT and THERAPY PLAN:   Malignant neoplasm of upper-outer quadrant of right breast in female, estrogen receptor negative ([place]) Wendy Webb is a 56 year old woman with history of stage IIb triple negative breast cancer diagnosed in June 2023 who continues on neoadjuvant chemotherapy.  She has completed Keytruda, Adriamycin, Cytoxan and now continues on Taxol, [place], [place].  Despite 2 dose reductions of her Taxol she continues to experience peripheral neuropathy that is worsening.  I discussed the risk of proceeding with the Taxol today considering her neuropathy continues to worsen.  We discussed how her sensory  neuropathies can progress into motor neuropathies and she can lose the ability to function which sometimes can be permanent.  We discussed that we will hold the Taxol [place] today and I took that out of her chemo regimen.  She will proceed with Keytruda alone.    I placed orders for her breast MRI to be completed between now and her next visit.  My nurse has reached out to request urgent authorization of her breast MRI so we can get this scheduled.  Wendy Webb will follow-up with Dr. Chryl Heck next week for labs, follow-up, and potential treatment.    All questions were answered. The patient knows to call the clinic with any problems, questions or concerns. We can certainly see the patient much sooner if necessary.  Total encounter time:30 minutes*in face-to-face visit time, chart review, lab review, care coordination, order entry, and documentation of the encounter time.    Wilber Bihari, NP 03/16/22 11:09 AM Medical Oncology and Hematology [facility] [place], [place] [address] Tel. [phone]    Fax. [phone]  *Total Encounter Time as defined by the Centers for Medicare and Medicaid Services includes, in addition to the face-to-face time of a patient visit (documented in the note above) non-face-to-face time: obtaining and reviewing outside history, ordering and reviewing medications, tests or procedures, care coordination (communications with other health care professionals or caregivers) and documentation in the medical record.

## 2022-03-16 NOTE — Assessment & Plan Note (Signed)
Wendy Webb is a 56 year old woman with history of stage IIb triple negative breast cancer diagnosed in June 2023 who continues on neoadjuvant chemotherapy.  She has completed Keytruda, Adriamycin, Cytoxan and now continues on Taxol, [place], [place].  Despite 2 dose reductions of her Taxol she continues to experience peripheral neuropathy that is worsening.  I discussed the risk of proceeding with the Taxol today considering her neuropathy continues to worsen.  We discussed how her sensory neuropathies can progress into motor neuropathies and she can lose the ability to function which sometimes can be permanent.  We discussed that we will hold the Taxol [place] today and I took that out of her chemo regimen.  She will proceed with Keytruda alone.    I placed orders for her breast MRI to be completed between now and her next visit.  My nurse has reached out to request urgent authorization of her breast MRI so we can get this scheduled.  Wendy Webb will follow-up with Dr. Chryl Heck next week for labs, follow-up, and potential treatment.

## 2022-03-16 NOTE — Patient Instructions (Signed)
[facility] MEDICAL ONCOLOGY   Discharge Instructions: Thank you for choosing [facility] to provide your oncology and hematology care.   If you have a lab appointment with the Cancer Center, please go directly to the Cancer Center and check in at the registration area.   Wear comfortable clothing and clothing appropriate for easy access to any Portacath or PICC line.   We strive to give you quality time with your provider. You may need to reschedule your appointment if you arrive late (15 or more minutes).  Arriving late affects you and other patients whose appointments are after yours.  Also, if you miss three or more appointments without notifying the office, you may be dismissed from the clinic at the provider's discretion.      For prescription refill requests, have your pharmacy contact our office and allow 72 hours for refills to be completed.    Today you received the following chemotherapy and/or immunotherapy agents: pembrolizumab      To help prevent nausea and vomiting after your treatment, we encourage you to take your nausea medication as directed.  BELOW ARE SYMPTOMS THAT SHOULD BE REPORTED IMMEDIATELY: *FEVER GREATER THAN 100.4 F (38 C) OR HIGHER *CHILLS OR SWEATING *NAUSEA AND VOMITING THAT IS NOT CONTROLLED WITH YOUR NAUSEA MEDICATION *UNUSUAL SHORTNESS OF BREATH *UNUSUAL BRUISING OR BLEEDING *URINARY PROBLEMS (pain or burning when urinating, or frequent urination) *BOWEL PROBLEMS (unusual diarrhea, constipation, pain near the anus) TENDERNESS IN MOUTH AND THROAT WITH OR WITHOUT PRESENCE OF ULCERS (sore throat, sores in mouth, or a toothache) UNUSUAL RASH, SWELLING OR PAIN  UNUSUAL VAGINAL DISCHARGE OR ITCHING   Items with * indicate a potential emergency and should be followed up as soon as possible or go to the Emergency Department if any problems should occur.  Please show the CHEMOTHERAPY ALERT CARD or IMMUNOTHERAPY ALERT CARD at  check-in to the Emergency Department and triage nurse.  Should you have questions after your visit or need to cancel or reschedule your appointment, please contact [facility] MEDICAL ONCOLOGY  Dept: [phone]  and follow the prompts.  Office hours are 8:00 a.m. to 4:30 p.m. Monday - Friday. Please note that voicemails left after 4:00 p.m. may not be returned until the following business day.  We are closed weekends and major holidays. You have access to a nurse at all times for urgent questions. Please call the main number to the clinic Dept: [phone] and follow the prompts.   For any non-urgent questions, you may also contact your provider using MyChart. We now offer e-Visits for anyone 18 and older to request care online for non-urgent symptoms. For details visit [email or web address].   Also download the MyChart app! Go to the app store, search "MyChart", open the app, select [facility], and log in with your MyChart username and password.  Masks are optional in the cancer centers. If you would like for your care team to wear a mask while they are taking care of you, please let them know. You may have one support person who is at least 55 years old accompany you for your appointments. 

## 2022-03-19 ENCOUNTER — Telehealth: Payer: Self-pay | Admitting: Hematology and Oncology

## 2022-03-19 NOTE — Telephone Encounter (Signed)
Called patient per [facility] RN. Patients appointments were rescheduled and the patient is aware of the changes made to her upcoming appointment appointments.

## 2022-03-20 ENCOUNTER — Encounter: Payer: Self-pay | Admitting: Hematology and Oncology

## 2022-03-20 ENCOUNTER — Encounter: Payer: Self-pay | Admitting: *Deleted

## 2022-03-21 MED FILL — Dexamethasone Sodium Phosphate Inj 100 MG/10ML: INTRAMUSCULAR | Qty: 1 | Status: AC

## 2022-03-22 ENCOUNTER — Inpatient Hospital Stay: Payer: BC Managed Care – PPO

## 2022-03-22 ENCOUNTER — Inpatient Hospital Stay (HOSPITAL_BASED_OUTPATIENT_CLINIC_OR_DEPARTMENT_OTHER): Payer: BC Managed Care – PPO | Admitting: Hematology and Oncology

## 2022-03-22 DIAGNOSIS — Z171 Estrogen receptor negative status [ER-]: Secondary | ICD-10-CM

## 2022-03-22 DIAGNOSIS — C50411 Malignant neoplasm of upper-outer quadrant of right female breast: Secondary | ICD-10-CM | POA: Diagnosis not present

## 2022-03-22 LAB — CBC WITH DIFFERENTIAL (CANCER CENTER ONLY)
Abs Immature Granulocytes: 0.01 10*3/uL (ref 0.00–0.07)
Basophils Absolute: 0 10*3/uL (ref 0.0–0.1)
Basophils Relative: 1 %
Eosinophils Absolute: 0 10*3/uL (ref 0.0–0.5)
Eosinophils Relative: 1 %
HCT: 28.5 % — ABNORMAL LOW (ref 36.0–46.0)
Hemoglobin: 9.8 g/dL — ABNORMAL LOW (ref 12.0–15.0)
Immature Granulocytes: 0 %
Lymphocytes Relative: 19 %
Lymphs Abs: 0.6 10*3/uL — ABNORMAL LOW (ref 0.7–4.0)
MCH: 35.9 pg — ABNORMAL HIGH (ref 26.0–34.0)
MCHC: 34.4 g/dL (ref 30.0–36.0)
MCV: 104.4 fL — ABNORMAL HIGH (ref 80.0–100.0)
Monocytes Absolute: 0.4 10*3/uL (ref 0.1–1.0)
Monocytes Relative: 11 %
Neutro Abs: 2.3 10*3/uL (ref 1.7–7.7)
Neutrophils Relative %: 68 %
Platelet Count: 180 10*3/uL (ref 150–400)
RBC: 2.73 MIL/uL — ABNORMAL LOW (ref 3.87–5.11)
RDW: 14.7 % (ref 11.5–15.5)
WBC Count: 3.4 10*3/uL — ABNORMAL LOW (ref 4.0–10.5)
nRBC: 0 % (ref 0.0–0.2)

## 2022-03-22 LAB — CMP (CANCER CENTER ONLY)
ALT: 27 U/L (ref 0–44)
AST: 26 U/L (ref 15–41)
Albumin: 3.8 g/dL (ref 3.5–5.0)
Alkaline Phosphatase: 55 U/L (ref 38–126)
Anion gap: 4 — ABNORMAL LOW (ref 5–15)
BUN: 11 mg/dL (ref 6–20)
CO2: 29 mmol/L (ref 22–32)
Calcium: 9.1 mg/dL (ref 8.9–10.3)
Chloride: 107 mmol/L (ref 98–111)
Creatinine: 0.47 mg/dL (ref 0.44–1.00)
GFR, Estimated: >60 mL/min (ref >60)
Glucose, Bld: 79 mg/dL (ref 70–99)
Potassium: 4 mmol/L (ref 3.5–5.1)
Sodium: 140 mmol/L (ref 135–145)
Total Bilirubin: 0.4 mg/dL (ref 0.3–1.2)
Total Protein: 5.9 g/dL — ABNORMAL LOW (ref 6.5–8.1)

## 2022-03-22 LAB — TSH: TSH: 1.952 u[IU]/mL (ref 0.350–4.500)

## 2022-03-22 NOTE — Progress Notes (Signed)
[facility] Follow up:    Rosalee Kaufman, PA-C [address] [place] [address]   DIAGNOSIS:  Cancer Staging  Malignant neoplasm of upper-outer quadrant of right breast in female, estrogen receptor negative ([place]) Staging form: Breast, AJCC 8th Edition - Clinical stage from 10/02/2021: Stage IIB (cT2, cN0, cM0, G3, ER-, PR-, HER2-) - Unsigned Stage prefix: Initial diagnosis Method of lymph node assessment: Clinical Histologic grading system: 3 grade system   SUMMARY OF ONCOLOGIC HISTORY: Oncology History  Malignant neoplasm of upper-outer quadrant of right breast in female, estrogen receptor negative ([place])  09/25/2021 Imaging   Bilateral screening mammogram with irregular hypoechoic mass in the right breast at 12:00 axis, 5 cm from nipple measuring 2.5 cm corresponding to the palpable area of concern. Ultrasound confirmed irregular hypoechoic mass in the right breast at the 12:00 5 cm from the nipple measuring 2.5 cm corresponding to the palpable area of concern.   09/26/2021 Pathology Results   Right breast needle core biopsy showed invasive ductal carcinoma, high-grade prognostic showed ER 0% negative PR 0% negative Ki-67 of 95% and HER2 negative   09/28/2021 Initial Diagnosis   Malignant neoplasm of upper-outer quadrant of right breast in female, estrogen receptor negative ([place])   10/16/2021 Genetic Testing   Negative hereditary cancer genetic testing: no pathogenic variants detected in [facility] CustomNext-Cancer +RNAinsight Panel.  Report date is October 16, 2021.   The CustomNext-Cancer+RNAinsight panel offered by [facility] includes sequencing and rearrangement analysis for the following 47 genes:  APC, ATM, AXIN2, BARD1, BMPR1A, BRCA1, BRCA2, BRIP1, CDH1, CDK4, CDKN2A, CHEK2, DICER1, EPCAM, GREM1, HOXB13, MEN1, MLH1, MSH2, MSH3, MSH6, MUTYH, NBN, NF1, NF2, NTHL1, PALB2, PMS2, POLD1, POLE, PTEN, RAD51C, RAD51D, RECQL, RET, SDHA, SDHAF2, SDHB, SDHC, SDHD,  SMAD4, SMARCA4, STK11, TP53, TSC1, TSC2, and VHL.  RNA data is routinely analyzed for use in variant interpretation for all genes.    10/20/2021 - 12/02/2021 Chemotherapy   Patient is on Treatment Plan : BREAST  Pembrolizumab (200) D1 + AC D1 q21d x 4 cycles / Pembrolizumab (200) D1 + Carboplatin (1.5) D1,8,15 + Paclitaxel (80) D1,8,15 q21d X 4 cycles     10/20/2021 -  Chemotherapy   Patient is on Treatment Plan : BREAST Pembrolizumab (200) D1 + AC D1 q21d x 4 cycles / Pembrolizumab (200) D1 + Carboplatin (1.5) D1,8,15 + Paclitaxel (80) D1,8,15 q21d X 4 cycles       CURRENT THERAPY: Neoadjuvant chemotherapy  INTERVAL HISTORY:  Zaya Kessenich 56 y.o. female returns for follow-up and evaluation prior to receiving Taxol [place] and [place].  She feels a bit better from neuropathy stand point compared to last visit. She however has noticed severe aches in her proximal muscles. She was hoping to be done with chemo and move on to surgery. She is still able to do all her ADL's without much difficulty but she sometimes has to take a break Rest of the pertinent 10 point ROS reviewed and negative.   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  MEDICAL HISTORY: 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[place])    09/26/2021 (right breast biopsy)   Mild mitral regurgitation 06/2019    Palpitations    PONV (postoperative nausea and vomiting)     SURGICAL HISTORY: Past Surgical History:  Procedure Laterality Date   CHOLECYSTECTOMY     GALLBLADDER SURGERY     PORTACATH PLACEMENT Left 10/18/2021   Procedure: PLA CEMENT OF PORT;  Surgeon: Jovita Kussmaul, MD;  Location: [facility] OR;  Service: General;  Laterality: Left;    SOCIAL HISTORY: Social History   Socioeconomic History   Marital status: Married    Spouse name: Not on file   Number of children: 2   Years of education: Not on file   Highest education level: Not on file  Occupational History    Comment: Hair dresser  Tobacco Use   Smoking status: Never   Smokeless tobacco: Never  Vaping Use   Vaping Use: Never used  Substance and Sexual Activity   Alcohol use: Yes    Alcohol/week: 0.0 standard drinks of alcohol    Comment: Glass wine per day   Drug use: No   Sexual activity: Yes    Partners: Male  Other Topics Concern   Not on file  Social History Narrative   Pt is a hair stylist   Married (3rd marriage)   2 sons (both grown)   Enjoys yard work, hiking, Scientist, clinical (histocompatibility and immunogenetics)   Complete 2 year cosmetology   Has one Neurosurgeon   Social Determinants of Radio broadcast assistant Strain: Not on Art therapist Insecurity: Not on file  Transportation Needs: Not on file  Physical Activity: Not on file  Stress: Not on file  Social Connections: Not on file  Intimate Partner Violence: Not on file    FAMILY HISTORY: Family History  Problem Relation Age of Onset   Hypertension Mother    Heart attack Mother    Heart disease Mother        Atrial fibrillation   Obstructive Sleep Apnea Mother    Hypertension Brother    Stroke Maternal Grandmother 75   Breast cancer Other 85       PGM's sister   Colon cancer Neg Hx    Esophageal cancer Neg Hx     Review of Systems  Constitutional:  Positive for fatigue. Negative for appetite change, chills, fever and unexpected weight change.  HENT:   Negative for hearing loss,  lump/mass and trouble swallowing.   Eyes:  Negative for eye problems and icterus.  Respiratory:  Negative for chest tightness, cough and shortness of breath.   Cardiovascular:  Negative for chest pain, leg swelling and palpitations.  Gastrointestinal:  Negative for abdominal distention, abdominal pain, constipation, diarrhea, nausea and vomiting.  Endocrine: Negative for hot flashes.  Genitourinary:  Negative for difficulty urinating.   Musculoskeletal:  Positive for myalgias (quadricep muscles as noted in interval history). Negative for arthralgias.  Skin:  Negative for itching and rash.  Neurological:  Negative for dizziness, extremity weakness, headaches and numbness.  Hematological:  Negative for adenopathy. Does not bruise/bleed easily.  Psychiatric/Behavioral:  Negative for depression. The patient is not nervous/anxious.       PHYSICAL EXAMINATION  ECOG PERFORMANCE STATUS: 1 - Symptomatic but completely ambulatory  Vitals:   03/22/22 1116  BP: (!) 143/83  Pulse:  74  Resp: 18  Temp: 97.8 F (36.6 C)  SpO2: 100%    Physical Exam Constitutional:      General: She is not in acute distress.    Appearance: Normal appearance. She is not toxic-appearing.  HENT:     Head: Normocephalic and atraumatic.  Eyes:     General: No scleral icterus. Cardiovascular:     Rate and Rhythm: Normal rate and regular rhythm.     Pulses: Normal pulses.     Heart sounds: Normal heart sounds.  Pulmonary:     Effort: Pulmonary effort is normal.     Breath sounds: Normal breath sounds.  Abdominal:     General: Abdomen is flat. Bowel sounds are normal. There is no distension.     Palpations: Abdomen is soft.     Tenderness: There is no abdominal tenderness.  Musculoskeletal:        General: No swelling.     Cervical back: Neck supple.  Lymphadenopathy:     Cervical: No cervical adenopathy.  Skin:    General: Skin is warm and dry.     Findings: No rash.  Neurological:     General: No  focal deficit present.     Mental Status: She is alert.  Psychiatric:        Mood and Affect: Mood normal.        Behavior: Behavior normal.   Complete response in the breast.  LABORATORY DATA:  CBC    Component Value Date/Time   WBC 3.4 (L) 03/22/2022 1013   WBC 8.1 01/05/2022 1531   RBC 2.73 (L) 03/22/2022 1013   HGB 9.8 (L) 03/22/2022 1013   HCT 28.5 (L) 03/22/2022 1013   PLT 180 03/22/2022 1013   MCV 104.4 (H) 03/22/2022 1013   MCH 35.9 (H) 03/22/2022 1013   MCHC 34.4 03/22/2022 1013   RDW 14.7 03/22/2022 1013   LYMPHSABS 0.6 (L) 03/22/2022 1013   MONOABS 0.4 03/22/2022 1013   EOSABS 0.0 03/22/2022 1013   BASOSABS 0.0 03/22/2022 1013    CMP     Component Value Date/Time   NA 140 03/22/2022 1013   K 4.0 03/22/2022 1013   CL 107 03/22/2022 1013   CO2 29 03/22/2022 1013   GLUCOSE 79 03/22/2022 1013   BUN 11 03/22/2022 1013   CREATININE 0.47 03/22/2022 1013   CALCIUM 9.1 03/22/2022 1013   PROT 5.9 (L) 03/22/2022 1013   ALBUMIN 3.8 03/22/2022 1013   AST 26 03/22/2022 1013   ALT 27 03/22/2022 1013   ALKPHOS 55 03/22/2022 1013   BILITOT 0.4 03/22/2022 1013   GFRNONAA >60 03/22/2022 1013   GFRAA >60 05/30/2019 1105        ASSESSMENT and THERAPY PLAN:   #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  Since last visit, we dose reduced her Taxol because of worsening neuropathy which has gotten better however persists.  She now complains of some proximal myalgias, thrush today.  Given no significant improvement in neuropathy and since she only has 2 weekly taxol treatments and concern for permanent neuropathy, patient is hoping to move on to surgery. I think this is very reasonable at this time.  I have sent an in basket message to the surgery team for further recommendations.    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   #  Myalgias, ? Taxane induced, will continue to monitor.  All questions were answered. The patient knows to call the clinic with any problems, questions or concerns. We can certainly see the patient much sooner if necessary.  Total encounter time:30 minutes*in face-to-face visit time, chart review, lab review, care coordination, order entry, and documentation of the encounter time.  *Total Encounter Time as defined by the Centers for Medicare and Medicaid Services includes, in addition to the face-to-face time of a patient visit (documented in the note above) non-face-to-face time: obtaining and reviewing outside history, ordering and reviewing medications, tests or procedures, care coordination (communications with other health care professionals or caregivers) and documentation in the medical record.

## 2022-03-23 ENCOUNTER — Encounter: Payer: Self-pay | Admitting: Hematology and Oncology

## 2022-03-23 NOTE — Assessment & Plan Note (Signed)
This is a very pleasant 55 year-old female patient with newly diagnosed left breast mass status post imaging and biopsy with pathology showing invasive poorly differentiated adenocarcinoma, grade 3, triple negative, high proliferation index of 95 percent presented in the breast [place] for additional recommendations.  Given triple negative tumor larger than 2 cm at diagnosis, we have discussed about considering neoadjuvant chemotherapy.  Today we have discussed about keynote 522 trial and the role of chemoimmunotherapy in the neoadjuvant setting for triple negative breast cancer.   We have discussed about the regimen, adverse effects of chemotherapy including but not limited to fatigue, nausea, vomiting, diarrhea, cytopenias, cardiotoxicity, neuropathy, risk of infections, alopecia, immunotherapy related adverse effects.  She understands that some of the side effects from chemoimmunotherapy can be life-threatening, and permanent.   We have discussed excellent complete pathologic response with neoadjuvant chemoimmunotherapy and triple negative breast cancers.  Following neoadjuvant chemotherapy, she will proceed with surgery and adjuvant immunotherapy depending on tolerance.  There is no role for antiestrogen therapy in this setting.    She is here before planned cycle 1 of Adriamycin, cyclophosphamide and Keytruda.  She denies any new health complaints since her last visit.  She had some additional right breast biopsies yesterday which did not show any evidence of malignancy.  CBC and CMP reviewed, satisfactory to proceed with treatment.  She will return to clinic before her next planned cycle of treatment.  She was strongly encouraged to call us with any intractable symptoms.  She will take nausea medication as needed, went over these medications once again.  We have once again discussed about arthralgias from the growth factor, she can use Claritin and Tylenol as recommended.

## 2022-03-26 ENCOUNTER — Ambulatory Visit (HOSPITAL_COMMUNITY)
Admission: RE | Admit: 2022-03-26 | Discharge: 2022-03-26 | Disposition: A | Discharge status: Home / Self Care | Payer: BC Managed Care – PPO | Source: Ambulatory Visit | Attending: Adult Health | Admitting: Adult Health

## 2022-03-26 DIAGNOSIS — C50411 Malignant neoplasm of upper-outer quadrant of right female breast: Secondary | ICD-10-CM | POA: Insufficient documentation

## 2022-03-26 DIAGNOSIS — Z171 Estrogen receptor negative status [ER-]: Secondary | ICD-10-CM

## 2022-03-26 MED ORDER — GADOBUTROL 1 MMOL/ML IV SOLN
7.0000 mL | Freq: Once | INTRAVENOUS | Status: AC | PRN
  Administered 2022-03-26: 7 mL via INTRAVENOUS

## 2022-03-29 ENCOUNTER — Ambulatory Visit: Payer: BC Managed Care – PPO

## 2022-03-29 ENCOUNTER — Other Ambulatory Visit: Payer: BC Managed Care – PPO

## 2022-03-29 ENCOUNTER — Encounter: Payer: Self-pay | Admitting: *Deleted

## 2022-03-29 ENCOUNTER — Ambulatory Visit: Payer: BC Managed Care – PPO | Admitting: Hematology and Oncology

## 2022-03-30 ENCOUNTER — Ambulatory Visit: Payer: BC Managed Care – PPO

## 2022-03-30 ENCOUNTER — Other Ambulatory Visit: Payer: Self-pay | Admitting: *Deleted

## 2022-03-30 ENCOUNTER — Inpatient Hospital Stay: Payer: BC Managed Care – PPO

## 2022-03-30 MED ORDER — AMOXICILLIN 500 MG PO TABS: 500.0000 mg | ORAL_TABLET | Freq: Once | ORAL | 0 refills | Status: AC

## 2022-03-30 NOTE — Progress Notes (Signed)
Pt called with recommendations to getting tooth extraction. Per Dr.Gudena, OK to give 1 time dose of amoxicillin po before dental appt. Called pt with directions. Pt verbalized understanding.

## 2022-03-31 ENCOUNTER — Inpatient Hospital Stay: Payer: BC Managed Care – PPO

## 2022-04-03 ENCOUNTER — Inpatient Hospital Stay: Payer: BC Managed Care – PPO

## 2022-04-04 ENCOUNTER — Encounter: Payer: Self-pay | Admitting: *Deleted

## 2022-04-04 ENCOUNTER — Encounter: Payer: Self-pay | Admitting: Hematology and Oncology

## 2022-04-05 ENCOUNTER — Telehealth: Payer: Self-pay

## 2022-04-05 NOTE — Telephone Encounter (Signed)
Spoke with patient regarding concerns that initial surgical consult with Dr. Marlou Starks and [facility] had to be rescheduled secondary to tooth abscess on 03/30/22 that needed to be addressed. Patient rescheduled with Dr. Marlou Starks to 04/17/22. [facility] states that they have placed patient on a waiting list. Encouraged patient call [facility] daily to see if there is a cancellation.

## 2022-04-11 ENCOUNTER — Ambulatory Visit: Payer: Self-pay | Admitting: General Surgery

## 2022-04-11 DIAGNOSIS — Z171 Estrogen receptor negative status [ER-]: Secondary | ICD-10-CM

## 2022-04-11 DIAGNOSIS — C50411 Malignant neoplasm of upper-outer quadrant of right female breast: Secondary | ICD-10-CM

## 2022-04-11 MED ORDER — KETOROLAC TROMETHAMINE 15 MG/ML IJ SOLN: 15.0000 mg | Freq: Once | INTRAMUSCULAR | Status: AC

## 2022-04-16 ENCOUNTER — Encounter: Payer: Self-pay | Admitting: *Deleted

## 2022-04-16 ENCOUNTER — Other Ambulatory Visit: Payer: Self-pay | Admitting: General Surgery

## 2022-04-16 DIAGNOSIS — Z171 Estrogen receptor negative status [ER-]: Secondary | ICD-10-CM

## 2022-04-16 DIAGNOSIS — C50411 Malignant neoplasm of upper-outer quadrant of right female breast: Secondary | ICD-10-CM

## 2022-04-27 ENCOUNTER — Encounter (HOSPITAL_BASED_OUTPATIENT_CLINIC_OR_DEPARTMENT_OTHER): Payer: Self-pay | Admitting: General Surgery

## 2022-04-27 ENCOUNTER — Other Ambulatory Visit: Payer: Self-pay

## 2022-05-03 ENCOUNTER — Ambulatory Visit
Admission: RE | Admit: 2022-05-03 | Discharge: 2022-05-03 | Disposition: A | Discharge status: Home / Self Care | Payer: BC Managed Care – PPO | Source: Ambulatory Visit | Attending: General Surgery | Admitting: General Surgery

## 2022-05-03 DIAGNOSIS — Z171 Estrogen receptor negative status [ER-]: Secondary | ICD-10-CM

## 2022-05-03 HISTORY — PX: BREAST BIOPSY: SHX20

## 2022-05-03 NOTE — Progress Notes (Signed)
Surgical soap given to patient with instructions and patient verbalized understanding.      Patient Instructions  The night before surgery:  No food after midnight. ONLY clear liquids after midnight  The day of surgery (if you do NOT have diabetes):  Drink ONE (1) Pre-Surgery Clear Ensure as directed.   This drink was given to you during your hospital  pre-op appointment visit. The pre-op nurse will instruct you on the time to drink the  Pre-Surgery Ensure depending on your surgery time. Finish the drink at the designated time by the pre-op nurse.  Nothing else to drink after completing the  Pre-Surgery Clear Ensure.  The day of surgery (if you have diabetes): Drink ONE (1) Gatorade 2 (G2) as directed. This drink was given to you during your hospital  pre-op appointment visit.  The pre-op nurse will instruct you on the time to drink the   Gatorade 2 (G2) depending on your surgery time. Color of the Gatorade may vary. Red is not allowed. Nothing else to drink after completing the  Gatorade 2 (G2).         If you have questions, please contact your surgeon's office.

## 2022-05-04 ENCOUNTER — Encounter (HOSPITAL_BASED_OUTPATIENT_CLINIC_OR_DEPARTMENT_OTHER)
Admission: RE | Disposition: A | Discharge status: Home / Self Care | Payer: Self-pay | Source: Home / Self Care | Attending: General Surgery

## 2022-05-04 ENCOUNTER — Ambulatory Visit (HOSPITAL_BASED_OUTPATIENT_CLINIC_OR_DEPARTMENT_OTHER): Payer: BC Managed Care – PPO | Admitting: Anesthesiology

## 2022-05-04 ENCOUNTER — Ambulatory Visit (HOSPITAL_COMMUNITY)
Admission: RE | Admit: 2022-05-04 | Discharge: 2022-05-04 | Disposition: A | Discharge status: Home / Self Care | Payer: BC Managed Care – PPO | Source: Ambulatory Visit | Attending: General Surgery | Admitting: General Surgery

## 2022-05-04 ENCOUNTER — Ambulatory Visit (HOSPITAL_BASED_OUTPATIENT_CLINIC_OR_DEPARTMENT_OTHER)
Admission: RE | Admit: 2022-05-04 | Discharge: 2022-05-04 | Disposition: A | Discharge status: Home / Self Care | Payer: BC Managed Care – PPO | Attending: General Surgery | Admitting: General Surgery

## 2022-05-04 ENCOUNTER — Ambulatory Visit
Admission: RE | Admit: 2022-05-04 | Discharge: 2022-05-04 | Disposition: A | Discharge status: Home / Self Care | Payer: BC Managed Care – PPO | Source: Ambulatory Visit | Attending: General Surgery | Admitting: General Surgery

## 2022-05-04 ENCOUNTER — Other Ambulatory Visit: Payer: Self-pay

## 2022-05-04 DIAGNOSIS — I10 Essential (primary) hypertension: Secondary | ICD-10-CM | POA: Insufficient documentation

## 2022-05-04 DIAGNOSIS — N6001 Solitary cyst of right breast: Secondary | ICD-10-CM | POA: Diagnosis not present

## 2022-05-04 DIAGNOSIS — C50411 Malignant neoplasm of upper-outer quadrant of right female breast: Secondary | ICD-10-CM | POA: Insufficient documentation

## 2022-05-04 DIAGNOSIS — Z171 Estrogen receptor negative status [ER-]: Secondary | ICD-10-CM | POA: Insufficient documentation

## 2022-05-04 DIAGNOSIS — Z01818 Encounter for other preprocedural examination: Secondary | ICD-10-CM

## 2022-05-04 DIAGNOSIS — Z9221 Personal history of antineoplastic chemotherapy: Secondary | ICD-10-CM | POA: Diagnosis not present

## 2022-05-04 HISTORY — PX: BREAST LUMPECTOMY WITH RADIOACTIVE SEED AND SENTINEL LYMPH NODE BIOPSY: SHX6550

## 2022-05-04 HISTORY — PX: BREAST LUMPECTOMY: SHX2

## 2022-05-04 HISTORY — PX: CYST REMOVAL TRUNK: SHX6283

## 2022-05-04 SURGERY — BREAST LUMPECTOMY WITH RADIOACTIVE SEED AND SENTINEL LYMPH NODE BIOPSY
Anesthesia: Regional | Site: Breast | Laterality: Right

## 2022-05-04 MED ORDER — GABAPENTIN 300 MG PO CAPS
ORAL_CAPSULE | ORAL | Status: AC
  Filled 2022-05-04: qty 1

## 2022-05-04 MED ORDER — KETOROLAC TROMETHAMINE 30 MG/ML IJ SOLN
30.0000 mg | Freq: Once | INTRAMUSCULAR | Status: AC | PRN
  Administered 2022-05-04: 30 mg via INTRAVENOUS

## 2022-05-04 MED ORDER — FENTANYL CITRATE (PF) 100 MCG/2ML IJ SOLN
50.0000 ug | Freq: Once | INTRAMUSCULAR | Status: AC
  Administered 2022-05-04: 50 ug via INTRAVENOUS

## 2022-05-04 MED ORDER — MIDAZOLAM HCL 2 MG/2ML IJ SOLN
2.0000 mg | Freq: Once | INTRAMUSCULAR | Status: AC
  Administered 2022-05-04: 2 mg via INTRAVENOUS

## 2022-05-04 MED ORDER — ACETAMINOPHEN 500 MG PO TABS
ORAL_TABLET | ORAL | Status: AC
  Filled 2022-05-04: qty 2

## 2022-05-04 MED ORDER — DEXAMETHASONE SODIUM PHOSPHATE 10 MG/ML IJ SOLN
INTRAMUSCULAR | Status: AC
  Filled 2022-05-04: qty 1

## 2022-05-04 MED ORDER — CHLORHEXIDINE GLUCONATE CLOTH 2 % EX PADS: 6.0000 | MEDICATED_PAD | Freq: Once | CUTANEOUS | Status: DC

## 2022-05-04 MED ORDER — OXYCODONE HCL 5 MG PO TABS: 5.0000 mg | ORAL_TABLET | Freq: Once | ORAL | Status: DC | PRN

## 2022-05-04 MED ORDER — EPHEDRINE 5 MG/ML INJ
INTRAVENOUS | Status: AC
  Filled 2022-05-04: qty 5

## 2022-05-04 MED ORDER — FENTANYL CITRATE (PF) 100 MCG/2ML IJ SOLN
25.0000 ug | INTRAMUSCULAR | Status: DC | PRN
  Administered 2022-05-04 (×2): 50 ug via INTRAVENOUS

## 2022-05-04 MED ORDER — PROPOFOL 10 MG/ML IV BOLUS
INTRAVENOUS | Status: DC | PRN
  Administered 2022-05-04: 200 mg via INTRAVENOUS

## 2022-05-04 MED ORDER — SUCCINYLCHOLINE CHLORIDE 200 MG/10ML IV SOSY
PREFILLED_SYRINGE | INTRAVENOUS | Status: AC
  Filled 2022-05-04: qty 10

## 2022-05-04 MED ORDER — KETOROLAC TROMETHAMINE 30 MG/ML IJ SOLN
INTRAMUSCULAR | Status: AC
  Filled 2022-05-04: qty 1

## 2022-05-04 MED ORDER — MIDAZOLAM HCL 2 MG/2ML IJ SOLN
INTRAMUSCULAR | Status: AC
  Filled 2022-05-04: qty 2

## 2022-05-04 MED ORDER — LIDOCAINE HCL (CARDIAC) PF 100 MG/5ML IV SOSY
PREFILLED_SYRINGE | INTRAVENOUS | Status: DC | PRN
  Administered 2022-05-04: 60 mg via INTRAVENOUS

## 2022-05-04 MED ORDER — FENTANYL CITRATE (PF) 100 MCG/2ML IJ SOLN
INTRAMUSCULAR | Status: DC | PRN
  Administered 2022-05-04 (×2): 50 ug via INTRAVENOUS

## 2022-05-04 MED ORDER — DEXAMETHASONE SODIUM PHOSPHATE 4 MG/ML IJ SOLN
INTRAMUSCULAR | Status: DC | PRN
  Administered 2022-05-04: 4 mg via INTRAVENOUS

## 2022-05-04 MED ORDER — AMISULPRIDE (ANTIEMETIC) 5 MG/2ML IV SOLN: 10.0000 mg | Freq: Once | INTRAVENOUS | Status: DC | PRN

## 2022-05-04 MED ORDER — FENTANYL CITRATE (PF) 100 MCG/2ML IJ SOLN
INTRAMUSCULAR | Status: AC
  Filled 2022-05-04: qty 2

## 2022-05-04 MED ORDER — ONDANSETRON HCL 4 MG/2ML IJ SOLN
INTRAMUSCULAR | Status: DC | PRN
  Administered 2022-05-04: 4 mg via INTRAVENOUS

## 2022-05-04 MED ORDER — LIDOCAINE 2% (20 MG/ML) 5 ML SYRINGE
INTRAMUSCULAR | Status: AC
  Filled 2022-05-04: qty 5

## 2022-05-04 MED ORDER — PHENYLEPHRINE 80 MCG/ML (10ML) SYRINGE FOR IV PUSH (FOR BLOOD PRESSURE SUPPORT)
PREFILLED_SYRINGE | INTRAVENOUS | Status: AC
  Filled 2022-05-04: qty 10

## 2022-05-04 MED ORDER — ACETAMINOPHEN 500 MG PO TABS
1000.0000 mg | ORAL_TABLET | ORAL | Status: AC
  Administered 2022-05-04: 1000 mg via ORAL

## 2022-05-04 MED ORDER — ONDANSETRON HCL 4 MG/2ML IJ SOLN
INTRAMUSCULAR | Status: AC
  Filled 2022-05-04: qty 2

## 2022-05-04 MED ORDER — CEFAZOLIN SODIUM-DEXTROSE 2-4 GM/100ML-% IV SOLN
2.0000 g | INTRAVENOUS | Status: AC
  Administered 2022-05-04: 2 g via INTRAVENOUS

## 2022-05-04 MED ORDER — BUPIVACAINE-EPINEPHRINE (PF) 0.5% -1:200000 IJ SOLN
INTRAMUSCULAR | Status: DC | PRN
  Administered 2022-05-04: 26 mL

## 2022-05-04 MED ORDER — LACTATED RINGERS IV SOLN: INTRAVENOUS | Status: DC

## 2022-05-04 MED ORDER — MIDAZOLAM HCL 5 MG/5ML IJ SOLN
INTRAMUSCULAR | Status: DC | PRN
  Administered 2022-05-04: 2 mg via INTRAVENOUS

## 2022-05-04 MED ORDER — PROMETHAZINE HCL 25 MG/ML IJ SOLN: 6.2500 mg | INTRAMUSCULAR | Status: DC | PRN

## 2022-05-04 MED ORDER — OXYCODONE HCL 5 MG PO TABS: 5.0000 mg | ORAL_TABLET | Freq: Four times a day (QID) | ORAL | 0 refills | Status: DC | PRN

## 2022-05-04 MED ORDER — PROPOFOL 500 MG/50ML IV EMUL
INTRAVENOUS | Status: AC
  Filled 2022-05-04: qty 50

## 2022-05-04 MED ORDER — OXYCODONE HCL 5 MG/5ML PO SOLN: 5.0000 mg | Freq: Once | ORAL | Status: DC | PRN

## 2022-05-04 MED ORDER — BUPIVACAINE-EPINEPHRINE (PF) 0.5% -1:200000 IJ SOLN
INTRAMUSCULAR | Status: DC | PRN
  Administered 2022-05-04: 30 mL via PERINEURAL

## 2022-05-04 MED ORDER — CEFAZOLIN SODIUM-DEXTROSE 2-4 GM/100ML-% IV SOLN
INTRAVENOUS | Status: AC
  Filled 2022-05-04: qty 100

## 2022-05-04 MED ORDER — GABAPENTIN 300 MG PO CAPS
300.0000 mg | ORAL_CAPSULE | ORAL | Status: AC
  Administered 2022-05-04: 300 mg via ORAL

## 2022-05-04 MED ORDER — TECHNETIUM TC 99M TILMANOCEPT KIT
1.0000 | PACK | Freq: Once | INTRAVENOUS | Status: AC | PRN
  Administered 2022-05-04: 1 via INTRADERMAL

## 2022-05-04 SURGICAL SUPPLY — 43 items
ADH SKN CLS APL DERMABOND .7 (GAUZE/BANDAGES/DRESSINGS) ×2
APL PRP STRL LF DISP 70% ISPRP (MISCELLANEOUS) ×2
APPLIER CLIP 9.375 MED OPEN (MISCELLANEOUS) ×2
APR CLP MED 9.3 20 MLT OPN (MISCELLANEOUS) ×2
BLADE SURG 15 STRL LF DISP TIS (BLADE) ×2 IMPLANT
BLADE SURG 15 STRL SS (BLADE) ×2
CANISTER SUC SOCK COL 7IN (MISCELLANEOUS) IMPLANT
CANISTER SUCT 1200ML W/VALVE (MISCELLANEOUS) IMPLANT
CHLORAPREP W/TINT 26 (MISCELLANEOUS) ×2 IMPLANT
CLIP APPLIE 9.375 MED OPEN (MISCELLANEOUS) ×2 IMPLANT
COVER BACK TABLE 60X90IN (DRAPES) ×2 IMPLANT
COVER MAYO STAND STRL (DRAPES) ×2 IMPLANT
COVER PROBE CYLINDRICAL 5X96 (MISCELLANEOUS) ×2 IMPLANT
DERMABOND ADVANCED .7 DNX12 (GAUZE/BANDAGES/DRESSINGS) ×2 IMPLANT
DRAPE LAPAROSCOPIC ABDOMINAL (DRAPES) ×2 IMPLANT
DRAPE UTILITY XL STRL (DRAPES) ×2 IMPLANT
ELECT COATED BLADE 2.86 ST (ELECTRODE) ×2 IMPLANT
ELECT REM PT RETURN 9FT ADLT (ELECTROSURGICAL) ×2
ELECTRODE REM PT RTRN 9FT ADLT (ELECTROSURGICAL) ×2 IMPLANT
GLOVE BIO SURGEON STRL SZ7.5 (GLOVE) ×2 IMPLANT
GOWN STRL REUS W/ TWL LRG LVL3 (GOWN DISPOSABLE) ×4 IMPLANT
GOWN STRL REUS W/TWL LRG LVL3 (GOWN DISPOSABLE) ×4
ILLUMINATOR WAVEGUIDE N/F (MISCELLANEOUS) IMPLANT
KIT MARKER MARGIN INK (KITS) ×2 IMPLANT
LIGHT WAVEGUIDE WIDE FLAT (MISCELLANEOUS) IMPLANT
NDL HYPO 25X1 1.5 SAFETY (NEEDLE) ×2 IMPLANT
NDL SAFETY ECLIP 18X1.5 (MISCELLANEOUS) IMPLANT
NEEDLE HYPO 25X1 1.5 SAFETY (NEEDLE) ×2 IMPLANT
NS IRRIG 1000ML POUR BTL (IV SOLUTION) IMPLANT
PACK BASIN DAY SURGERY FS (CUSTOM PROCEDURE TRAY) ×2 IMPLANT
PENCIL SMOKE EVACUATOR (MISCELLANEOUS) ×2 IMPLANT
SLEEVE SCD COMPRESS KNEE MED (STOCKING) ×2 IMPLANT
SPIKE FLUID TRANSFER (MISCELLANEOUS) IMPLANT
SPONGE T-LAP 18X18 ~~LOC~~+RFID (SPONGE) ×2 IMPLANT
SUT MON AB 4-0 PC3 18 (SUTURE) ×4 IMPLANT
SUT SILK 2 0 SH (SUTURE) IMPLANT
SUT VICRYL 3-0 CR8 SH (SUTURE) ×2 IMPLANT
SYR CONTROL 10ML LL (SYRINGE) ×2 IMPLANT
TOWEL GREEN STERILE FF (TOWEL DISPOSABLE) ×2 IMPLANT
TRACER MAGTRACE VIAL (MISCELLANEOUS) IMPLANT
TRAY FAXITRON CT DISP (TRAY / TRAY PROCEDURE) ×2 IMPLANT
TUBE CONNECTING 20X1/4 (TUBING) IMPLANT
YANKAUER SUCT BULB TIP NO VENT (SUCTIONS) IMPLANT

## 2022-05-04 NOTE — Transfer of Care (Signed)
Immediate Anesthesia Transfer of Care Note  Patient: Wendy Webb  Procedure(s) Performed: RIGHT BREAST LUMPECTOMY WITH RADIOACTIVE SEED AND SENTINEL LYMPH NODE BIOPSY (Right: Breast) CYST REMOVAL AXILLA (Right: Axilla)  Patient Location: PACU  Anesthesia Type:General  Level of Consciousness: awake, alert , oriented, drowsy, and patient cooperative  Airway & Oxygen Therapy: Patient Spontanous Breathing and Patient connected to face mask oxygen  Post-op Assessment: Report given to RN and Post -op Vital signs reviewed and stable  Post vital signs: Reviewed and stable  Last Vitals:  Vitals Value Taken Time  BP    Temp    Pulse    Resp    SpO2      Last Pain:  Vitals:   05/04/22 1322  PainSc: 0-No pain         Complications: No notable events documented.

## 2022-05-04 NOTE — Anesthesia Preprocedure Evaluation (Addendum)
Anesthesia Evaluation  Patient identified by MRN, date of birth, ID band Patient awake    Reviewed: Allergy & Precautions, NPO status , Patient's Chart, lab work & pertinent test results  History of Anesthesia Complications (+) PONV  Airway Mallampati: II  TM Distance: >3 FB Neck ROM: Full    Dental no notable dental hx.    Pulmonary neg pulmonary ROS   Pulmonary exam normal        Cardiovascular hypertension, Normal cardiovascular exam+ Valvular Problems/Murmurs MR      Neuro/Psych negative neurological ROS  negative psych ROS   GI/Hepatic negative GI ROS, Neg liver ROS,,,  Endo/Other  negative endocrine ROS    Renal/GU negative Renal ROS     Musculoskeletal negative musculoskeletal ROS (+)    Abdominal   Peds  Hematology negative hematology ROS (+)   Anesthesia Other Findings RIGHT BREAST CANCER  Reproductive/Obstetrics                             Anesthesia Physical Anesthesia Plan  ASA: 2  Anesthesia Plan: General and Regional   Post-op Pain Management: Regional block*   Induction: Intravenous  PONV Risk Score and Plan: 4 or greater and Ondansetron, Dexamethasone, Propofol infusion, Midazolam and Treatment may vary due to age or medical condition  Airway Management Planned: LMA  Additional Equipment:   Intra-op Plan:   Post-operative Plan: Extubation in OR  Informed Consent: I have reviewed the patients History and Physical, chart, labs and discussed the procedure including the risks, benefits and alternatives for the proposed anesthesia with the patient or authorized representative who has indicated his/her understanding and acceptance.     Dental advisory given  Plan Discussed with: CRNA  Anesthesia Plan Comments:        Anesthesia Quick Evaluation

## 2022-05-04 NOTE — Anesthesia Procedure Notes (Signed)
Anesthesia Regional Block: Pectoralis block   Pre-Anesthetic Checklist: , timeout performed,  Correct Patient, Correct Site, Correct Laterality,  Correct Procedure, Correct Position, site marked,  Risks and benefits discussed,  Surgical consent,  Pre-op evaluation,  At surgeon's request and post-op pain management  Laterality: Right  Prep: chloraprep       Needles:  Injection technique: Single-shot  Needle Type: Echogenic Stimulator Needle     Needle Length: 10cm  Needle Gauge: 20     Additional Needles:   Procedures:,,,, ultrasound used (permanent image in chart),,    Narrative:  Start time: 05/04/2022 10:35 AM End time: 05/04/2022 10:45 AM Injection made incrementally with aspirations every 5 mL.  Performed by: Personally  Anesthesiologist: Murvin Natal, MD  Additional Notes: Functioning IV was confirmed and monitors were applied.  A timeout was performed. Sterile prep, hand hygiene and sterile gloves were used. A 177m 20ga Bbraun echogenic stimulator needle was used. Negative aspiration and negative test dose prior to incremental administration of local anesthetic. The patient tolerated the procedure well.  Ultrasound guidance: relevent anatomy identified, needle position confirmed, local anesthetic spread visualized around nerve(s), vascular puncture avoided.  Image printed for medical record.

## 2022-05-04 NOTE — Discharge Instructions (Addendum)
OK to have tylenol again after 530 pm OK to have ibuprofen again after 11pm   Post Anesthesia Home Care Instructions  Activity: Get plenty of rest for the remainder of the day. A responsible individual must stay with you for 24 hours following the procedure.  For the next 24 hours, DO NOT: -Drive a car -Paediatric nurse -Drink alcoholic beverages -Take any medication unless instructed by your physician -Make any legal decisions or sign important papers.  Meals: Start with liquid foods such as gelatin or soup. Progress to regular foods as tolerated. Avoid greasy, spicy, heavy foods. If nausea and/or vomiting occur, drink only clear liquids until the nausea and/or vomiting subsides. Call your physician if vomiting continues.  Special Instructions/Symptoms: Your throat may feel dry or sore from the anesthesia or the breathing tube placed in your throat during surgery. If this causes discomfort, gargle with warm salt water. The discomfort should disappear within 24 hours.  If you had a scopolamine patch placed behind your ear for the management of post- operative nausea and/or vomiting:  1. The medication in the patch is effective for 72 hours, after which it should be removed.  Wrap patch in a tissue and discard in the trash. Wash hands thoroughly with soap and water. 2. You may remove the patch earlier than 72 hours if you experience unpleasant side effects which may include dry mouth, dizziness or visual disturbances. 3. Avoid touching the patch. Wash your hands with soap and water after contact with the patch.

## 2022-05-04 NOTE — Op Note (Addendum)
05/04/2022  4:31 PM  PATIENT:  Wendy Webb  57 y.o. female  PRE-OPERATIVE DIAGNOSIS:  RIGHT BREAST CANCER, RIGHT AXILLARY CYST  POST-OPERATIVE DIAGNOSIS:  RIGHT BREAST CANCER, RIGHT AXILLARY CYST  PROCEDURE:  Procedure(s): RIGHT BREAST LUMPECTOMY WITH RADIOACTIVE SEED LOCALIZATION AND DEEP RIGHT AXILLARY SENTINEL LYMPH NODE BIOPSY (Right) CYST REMOVAL AXILLA (Right)  SURGEON:  Surgeon(s) and Role:    * Jovita Kussmaul, MD - Primary  PHYSICIAN ASSISTANT:   ASSISTANTS: none   ANESTHESIA:   local and general  EBL:  25 mL   BLOOD ADMINISTERED:none  DRAINS: none   LOCAL MEDICATIONS USED:  MARCAINE     SPECIMEN:  Source of Specimen:  right breast tissue with additional superior margin, sentinel node x 2, cyst right axilla  DISPOSITION OF SPECIMEN:  PATHOLOGY  COUNTS:  YES  TOURNIQUET:  * No tourniquets in log *  DICTATION: .Dragon Dictation  After informed consent was obtained the patient was brought to the operating room and placed in the supine position on the operating table.  After adequate induction of general anesthesia the patient's right chest, breast, and axilla were prepped with ChloraPrep, allowed to dry, and draped in usual sterile manner.  An appropriate timeout was performed.  Previously an I-125 seed was placed in the upper portion of the right breast to mark an area of invasive breast cancer.  Also earlier in the day the patient underwent injection of 1 mCi of technetium sulfur colloid in the subareolar position on the right.  Attention was first turned to the cyst in the right axilla.  The area around this was infiltrated with quarter percent Marcaine.  The cyst was excised in elliptical fashion with a 15 blade knife removing the skin and subcutaneous tissue around it so that the entire cyst was removed.  The cyst measured 1cm. Hemostasis was achieved using the Bovie electrocautery.  The cyst was sent to pathology.  The incision was closed with 4-0 Monocryl  subcuticular stitches.  Attention was then turned to the right axilla.  The neoprobe was set to technetium and an area of radioactivity was readily identified in the right axilla.  The area overlying this was infiltrated with quarter percent Marcaine.  A small transversely oriented incision was made with a 15 blade knife overlying the area of radioactivity.  The incision was carried through the skin and subcutaneous tissue sharply with the electrocautery until the deep right axillary space was entered.  Blunt hemostat dissection was carried out under the direction of the neoprobe.  I was able to identify 2 lymph nodes with radioactivity.  Each of these was excised sharply with the electrocautery and the surrounding small vessels and lymphatics were controlled with clips.  Ex vivo counts on these nodes ranged from [phone].  No other hot or palpable nodes were identified in the right axilla.  Hemostasis was achieved using the Bovie electrocautery.  The deep layer of the incision was then closed with interrupted 3-0 Vicryl stitches.  The skin was closed with a running 4-0 Monocryl subcuticular stitch.  Attention was then turned to the right breast.  The neoprobe was set to I-125 in the area of radioactivity was readily identified in the upper portion of the right breast.  Although the seed seem to be fairly deep in the breast tissue the fullness overlying that seem to come directly up to the skin.  Because of this I elected to make an elliptical incision in the skin overlying the area of radioactivity.  The  incision was carried through the skin and subcutaneous tissue sharply with the electrocautery.  Dissection was then carried around the radioactive seed while checking the area of radioactivity frequently.  Once the specimen was removed it was oriented with the appropriate paint colors.  A specimen radiograph was obtained that showed the clip and seed to be near the center of the specimen.  It did seem to be a  little bit close on the superior edge so I did remove an additional superior margin and marked it with the appropriate paint color.  I also removed an additional deep margin and it was marked in the similar manner.  The deep margin was now the muscle of the chest wall.  All of this tissue was then sent to pathology for further evaluation.  Hemostasis was achieved using the Bovie electrocautery.  The wound was irrigated with saline and infiltrated with more quarter percent Marcaine.  The cavity was marked with clips.  The deep layer of the incision was then closed with interrupted 3-0 Vicryl stitches.  The skin was then closed with running 4-0 Monocryl subcuticular stitch.  Dermabond dressings were applied to all 3 incisions.  The patient tolerated the procedure well.  At the end of the case all needle sponge and instrument counts were correct.  The patient was then awakened and taken to recovery in stable condition.  Of note the cyst measured about 2 cm in diameter.  PLAN OF CARE: Discharge to home after PACU  PATIENT DISPOSITION:  PACU - hemodynamically stable.   Delay start of Pharmacological VTE agent (>24hrs) due to surgical blood loss or risk of bleeding: not applicable

## 2022-05-04 NOTE — H&P (Signed)
MRN: [id number] DOB: Apr 08, 1966 Subjective  Chief Complaint: New Consultation   History of Present Illness: Wendy Webb is a 57 y.o. female who is seen today for right breast cancer. The patient is a 57 year old white female who was originally diagnosed with a 2.7 cm triple negative Ki-67 of 95% cancer in the upper central right breast back in August. She underwent neoadjuvant chemotherapy. Her last dose of traditional chemotherapy was November 30. She had a good response to treatment and the cancer now measures 1.3 cm. The lymph nodes look normal. She is now ready to schedule definitive surgery    Review of Systems: A complete review of systems was obtained from the patient. I have reviewed this information and discussed as appropriate with the patient. See HPI as well for other ROS.  ROS  Medical History: Past Medical History: Diagnosis Date Arrhythmia  Patient Active Problem List Diagnosis Malignant neoplasm of upper-outer quadrant of right breast in female, estrogen receptor negative  Past Surgical History: Procedure Laterality Date CHOLECYSTECTOMY N/A 1994 LAPAROSCOPIC CHOLECYSTECTOMY   Allergies Allergen Reactions Shellfish Containing Products Nausea And Vomiting  Current Outpatient Medications on File Prior to Visit Medication Sig Dispense Refill multivitamin with iron-minerals (SUPER THERA VITE M) tablet Take 1 tablet by mouth multivitamin with minerals tablet Take 1 tablet by mouth once daily  No current facility-administered medications on file prior to visit.  Family History Problem Relation Age of Onset Skin cancer Mother Obesity Mother High blood pressure (Hypertension) Mother Coronary Artery Disease (Blocked arteries around heart) Mother Stroke Maternal Grandmother High blood pressure (Hypertension) Maternal Grandmother   Social History  Tobacco Use Smoking Status Never Smokeless Tobacco Never   Social History  Socioeconomic  History Marital status: Married Tobacco Use Smoking status: Never Smokeless tobacco: Never Vaping Use Vaping Use: Never used Substance and Sexual Activity Alcohol use: Yes Drug use: Never  Objective:  Vitals: BP: 130/86 Pulse: 95 Temp: 36.7 C (98 F) SpO2: 99% Weight: 69.9 kg (154 lb) Height: 170.2 cm ('5\' 7"'$ )  Body mass index is 24.12 kg/m.  Physical Exam Vitals reviewed. Constitutional: General: She is not in acute distress. Appearance: Normal appearance. HENT: Head: Normocephalic and atraumatic. Right Ear: External ear normal. Left Ear: External ear normal. Nose: Nose normal. Mouth/Throat: Mouth: Mucous membranes are moist. Pharynx: Oropharynx is clear. Eyes: General: No scleral icterus. Extraocular Movements: Extraocular movements intact. Conjunctiva/sclera: Conjunctivae normal. Pupils: Pupils are equal, round, and reactive to light. Cardiovascular: Rate and Rhythm: Normal rate and regular rhythm. Pulses: Normal pulses. Heart sounds: Normal heart sounds. Pulmonary: Effort: Pulmonary effort is normal. No respiratory distress. Breath sounds: Normal breath sounds. Abdominal: General: Bowel sounds are normal. Palpations: Abdomen is soft. Tenderness: There is no abdominal tenderness. Musculoskeletal: General: No swelling, tenderness or deformity. Normal range of motion. Cervical back: Normal range of motion and neck supple. Skin: General: Skin is warm and dry. Coloration: Skin is not jaundiced. Neurological: General: No focal deficit present. Mental Status: She is alert and oriented to person, place, and time. Psychiatric: Mood and Affect: Mood normal. Behavior: Behavior normal.    Breast: There is no palpable mass in either breast. There is no palpable axillary, supraclavicular, or cervical lymphadenopathy.  Labs, Imaging and Diagnostic Testing:  Assessment and Plan:  Diagnoses and all orders for this visit:  Malignant neoplasm of  upper-outer quadrant of right breast in female, estrogen receptor negative    The patient has had a good response to neoadjuvant chemotherapy for a upper central right breast cancer that was  triple negative. She has decided on breast conservation which I feel is very reasonable. She is a good candidate for sentinel node biopsy as well. I have discussed with her in detail the risks and benefits of the operation as well as some of the technical aspects including the use of a radioactive seed for localization and she understands and wishes to proceed. She will keep her port so that she can continue to receive Keytruda. We will begin surgical planning. She also has a sebaceous cyst in the right axilla that she would like to have removed at the same time and I think this is reasonable.

## 2022-05-04 NOTE — Anesthesia Procedure Notes (Signed)
Date/Time: 05/04/2022 3:06 PM  Performed by: Willa Frater, CRNALMA: LMA inserted LMA Size: 4.0

## 2022-05-04 NOTE — Interval H&P Note (Signed)
History and Physical Interval Note:  05/04/2022 2:38 PM  Wendy Webb  has presented today for surgery, with the diagnosis of RIGHT BREAST CANCER.  The various methods of treatment have been discussed with the patient and family. After consideration of risks, benefits and other options for treatment, the patient has consented to  Procedure(s): RIGHT BREAST LUMPECTOMY WITH RADIOACTIVE SEED AND SENTINEL LYMPH NODE BIOPSY (Right) as a surgical intervention.  The patient's history has been reviewed, patient examined, no change in status, stable for surgery.  I have reviewed the patient's chart and labs.  Questions were answered to the patient's satisfaction.     [name]

## 2022-05-04 NOTE — Progress Notes (Signed)
Assisted Dr. Roanna Banning with right, pectoralis, ultrasound guided block. Side rails up, monitors on throughout procedure. See vital signs in flow sheet. Tolerated Procedure well.

## 2022-05-04 NOTE — Anesthesia Procedure Notes (Signed)
Procedure Name: LMA Insertion Date/Time: 05/04/2022 3:06 PM  Performed by: Willa Frater, CRNAPre-anesthesia Checklist: Patient identified, Emergency Drugs available, Suction available and Patient being monitored Patient Re-evaluated:Patient Re-evaluated prior to induction Oxygen Delivery Method: Circle system utilized Preoxygenation: Pre-oxygenation with 100% oxygen Induction Type: IV induction Ventilation: Mask ventilation without difficulty LMA: LMA inserted LMA Size: 4.0 Number of attempts: 1 Airway Equipment and Method: Bite block Placement Confirmation: positive ETCO2 Tube secured with: Tape Dental Injury: Teeth and Oropharynx as per pre-operative assessment

## 2022-05-05 NOTE — Anesthesia Postprocedure Evaluation (Signed)
Anesthesia Post Note  Patient: Aleeya Veitch  Procedure(s) Performed: RIGHT BREAST LUMPECTOMY WITH RADIOACTIVE SEED AND SENTINEL LYMPH NODE BIOPSY (Right: Breast) CYST REMOVAL AXILLA (Right: Axilla)     Patient location during evaluation: PACU Anesthesia Type: Regional and General Level of consciousness: awake Pain management: pain level controlled Vital Signs Assessment: post-procedure vital signs reviewed and stable Respiratory status: spontaneous breathing, nonlabored ventilation and respiratory function stable Cardiovascular status: blood pressure returned to baseline and stable Postop Assessment: no apparent nausea or vomiting Anesthetic complications: no   No notable events documented.  Last Vitals:  Vitals:   05/04/22 1715 05/04/22 1726  BP:  135/80  Pulse:  80  Resp:  16  Temp:  36.6 C  SpO2: (P) 94% 100%    Last Pain:  Vitals:   05/04/22 1726  TempSrc: Oral  PainSc: 0-No pain                 Aubrei Bouchie P Jameica Couts

## 2022-05-07 ENCOUNTER — Inpatient Hospital Stay: Payer: BC Managed Care – PPO | Attending: Hematology and Oncology | Admitting: Hematology and Oncology

## 2022-05-07 ENCOUNTER — Encounter: Payer: Self-pay | Admitting: Hematology and Oncology

## 2022-05-07 ENCOUNTER — Inpatient Hospital Stay: Payer: BC Managed Care – PPO

## 2022-05-07 ENCOUNTER — Encounter (HOSPITAL_BASED_OUTPATIENT_CLINIC_OR_DEPARTMENT_OTHER): Payer: Self-pay | Admitting: General Surgery

## 2022-05-07 ENCOUNTER — Telehealth: Payer: Self-pay

## 2022-05-07 ENCOUNTER — Other Ambulatory Visit: Payer: Self-pay

## 2022-05-07 VITALS — BP 144/77 | HR 68 | Temp 97.8°F | Resp 16 | Ht 67.0 in | Wt 157.6 lb

## 2022-05-07 DIAGNOSIS — Z79899 Other long term (current) drug therapy: Secondary | ICD-10-CM | POA: Diagnosis not present

## 2022-05-07 DIAGNOSIS — G620 Drug-induced polyneuropathy: Secondary | ICD-10-CM

## 2022-05-07 DIAGNOSIS — C50411 Malignant neoplasm of upper-outer quadrant of right female breast: Secondary | ICD-10-CM | POA: Diagnosis not present

## 2022-05-07 DIAGNOSIS — G629 Polyneuropathy, unspecified: Secondary | ICD-10-CM | POA: Diagnosis not present

## 2022-05-07 DIAGNOSIS — Z171 Estrogen receptor negative status [ER-]: Secondary | ICD-10-CM | POA: Insufficient documentation

## 2022-05-07 DIAGNOSIS — I10 Essential (primary) hypertension: Secondary | ICD-10-CM | POA: Insufficient documentation

## 2022-05-07 DIAGNOSIS — T451X5A Adverse effect of antineoplastic and immunosuppressive drugs, initial encounter: Secondary | ICD-10-CM | POA: Diagnosis not present

## 2022-05-07 DIAGNOSIS — Z95828 Presence of other vascular implants and grafts: Secondary | ICD-10-CM

## 2022-05-07 LAB — CBC WITH DIFFERENTIAL (CANCER CENTER ONLY)
Abs Immature Granulocytes: 0 10*3/uL (ref 0.00–0.07)
Basophils Absolute: 0 10*3/uL (ref 0.0–0.1)
Basophils Relative: 0 %
Eosinophils Absolute: 0.1 10*3/uL (ref 0.0–0.5)
Eosinophils Relative: 2 %
HCT: 32.3 % — ABNORMAL LOW (ref 36.0–46.0)
Hemoglobin: 10.9 g/dL — ABNORMAL LOW (ref 12.0–15.0)
Immature Granulocytes: 0 %
Lymphocytes Relative: 32 %
Lymphs Abs: 0.8 10*3/uL (ref 0.7–4.0)
MCH: 33.9 pg (ref 26.0–34.0)
MCHC: 33.7 g/dL (ref 30.0–36.0)
MCV: 100.3 fL — ABNORMAL HIGH (ref 80.0–100.0)
Monocytes Absolute: 0.3 10*3/uL (ref 0.1–1.0)
Monocytes Relative: 13 %
Neutro Abs: 1.3 10*3/uL — ABNORMAL LOW (ref 1.7–7.7)
Neutrophils Relative %: 53 %
Platelet Count: 174 10*3/uL (ref 150–400)
RBC: 3.22 MIL/uL — ABNORMAL LOW (ref 3.87–5.11)
RDW: 11.8 % (ref 11.5–15.5)
WBC Count: 2.5 10*3/uL — ABNORMAL LOW (ref 4.0–10.5)
nRBC: 0 % (ref 0.0–0.2)

## 2022-05-07 LAB — CMP (CANCER CENTER ONLY)
ALT: 32 U/L (ref 0–44)
AST: 39 U/L (ref 15–41)
Albumin: 3.7 g/dL (ref 3.5–5.0)
Alkaline Phosphatase: 71 U/L (ref 38–126)
Anion gap: 5 (ref 5–15)
BUN: 9 mg/dL (ref 6–20)
CO2: 28 mmol/L (ref 22–32)
Calcium: 8.8 mg/dL — ABNORMAL LOW (ref 8.9–10.3)
Chloride: 106 mmol/L (ref 98–111)
Creatinine: 0.52 mg/dL (ref 0.44–1.00)
GFR, Estimated: >60 mL/min (ref >60)
Glucose, Bld: 75 mg/dL (ref 70–99)
Potassium: 3.8 mmol/L (ref 3.5–5.1)
Sodium: 139 mmol/L (ref 135–145)
Total Bilirubin: 0.3 mg/dL (ref 0.3–1.2)
Total Protein: 6.5 g/dL (ref 6.5–8.1)

## 2022-05-07 LAB — TSH: TSH: 1.964 u[IU]/mL (ref 0.350–4.500)

## 2022-05-07 LAB — SURGICAL PATHOLOGY

## 2022-05-07 MED ORDER — HEPARIN SOD (PORK) LOCK FLUSH 100 UNIT/ML IV SOLN
500.0000 [IU] | Freq: Once | INTRAVENOUS | Status: AC
  Administered 2022-05-07: 500 [IU]

## 2022-05-07 MED ORDER — SODIUM CHLORIDE 0.9% FLUSH
10.0000 mL | Freq: Once | INTRAVENOUS | Status: AC
  Administered 2022-05-07: 10 mL

## 2022-05-07 NOTE — Progress Notes (Signed)
[facility] Follow up:    Wendy Kaufman, PA-C [address] [place] [address]   DIAGNOSIS:  Cancer Staging  Malignant neoplasm of upper-outer quadrant of right breast in female, estrogen receptor negative ([place]) Staging form: Breast, AJCC 8th Edition - Clinical stage from 10/02/2021: Stage IIB (cT2, cN0, cM0, G3, ER-, PR-, HER2-) - Unsigned Stage prefix: Initial diagnosis Method of lymph node assessment: Clinical Histologic grading system: 3 grade system   SUMMARY OF ONCOLOGIC HISTORY: Oncology History  Malignant neoplasm of upper-outer quadrant of right breast in female, estrogen receptor negative ([place])  09/25/2021 Imaging   Bilateral screening mammogram with irregular hypoechoic mass in the right breast at 12:00 axis, 5 cm from nipple measuring 2.5 cm corresponding to the palpable area of concern. Ultrasound confirmed irregular hypoechoic mass in the right breast at the 12:00 5 cm from the nipple measuring 2.5 cm corresponding to the palpable area of concern.   09/26/2021 Pathology Results   Right breast needle core biopsy showed invasive ductal carcinoma, high-grade prognostic showed ER 0% negative PR 0% negative Ki-67 of 95% and HER2 negative   09/28/2021 Initial Diagnosis   Malignant neoplasm of upper-outer quadrant of right breast in female, estrogen receptor negative ([place])   10/16/2021 Genetic Testing   Negative hereditary cancer genetic testing: no pathogenic variants detected in [facility] CustomNext-Cancer +RNAinsight Panel.  Report date is October 16, 2021.   The CustomNext-Cancer+RNAinsight panel offered by [facility] includes sequencing and rearrangement analysis for the following 47 genes:  APC, ATM, AXIN2, BARD1, BMPR1A, BRCA1, BRCA2, BRIP1, CDH1, CDK4, CDKN2A, CHEK2, DICER1, EPCAM, GREM1, HOXB13, MEN1, MLH1, MSH2, MSH3, MSH6, MUTYH, NBN, NF1, NF2, NTHL1, PALB2, PMS2, POLD1, POLE, PTEN, RAD51C, RAD51D, RECQL, RET, SDHA, SDHAF2, SDHB, SDHC, SDHD,  SMAD4, SMARCA4, STK11, TP53, TSC1, TSC2, and VHL.  RNA data is routinely analyzed for use in variant interpretation for all genes.    10/20/2021 - 12/02/2021 Chemotherapy   Patient is on Treatment Plan : BREAST  Pembrolizumab (200) D1 + AC D1 q21d x 4 cycles / Pembrolizumab (200) D1 + Carboplatin (1.5) D1,8,15 + Paclitaxel (80) D1,8,15 q21d X 4 cycles     10/20/2021 - 03/16/2022 Chemotherapy   Patient is on Treatment Plan : BREAST Pembrolizumab (200) D1 + AC D1 q21d x 4 cycles / Pembrolizumab (200) D1 + Carboplatin (1.5) D1,8,15 + Paclitaxel (80) D1,8,15 q21d X 4 cycles       CURRENT THERAPY: Neoadjuvant chemotherapy  INTERVAL HISTORY:  Wendy Webb 57 y.o. female returns for follow-up and evaluation after right breast lumpectomy.   She had her surgery on Friday.  She had some delays in surgery because of tooth abscess.  She has pain as expected postop but does not want to take any pain medication. She continues to have some mild neuropathy in her feet.  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[place])    09/26/2021 (right breast biopsy)   Mild mitral regurgitation 06/2019   Palpitations    PONV (postoperative nausea and vomiting)     SURGICAL  HISTORY: Past Surgical History:  Procedure Laterality Date   BREAST BIOPSY  05/03/2022   MM RT RADIOACTIVE SEED LOC MAMMO GUIDE 05/03/2022 [facility] MAMMOGRAPHY   BREAST LUMPECTOMY WITH RADIOACTIVE SEED AND SENTINEL LYMPH NODE BIOPSY Right 05/04/2022   Procedure: RIGHT BREAST LUMPECTOMY WITH RADIOACTIVE SEED AND SENTINEL LYMPH NODE BIOPSY;  Surgeon: Jovita Kussmaul, MD;  Location: [facility];  Service: General;  Laterality: Right;   CHOLECYSTECTOMY     CYST REMOVAL TRUNK Right 05/04/2022   Procedure: CYST REMOVAL AXILLA;  Surgeon: Jovita Kussmaul, MD;  Location: [facility];  Service: General;  Laterality: Right;   GALLBLADDER SURGERY     PORTACATH PLACEMENT Left 10/18/2021   Procedure: PLA CEMENT OF PORT;  Surgeon: Jovita Kussmaul, MD;  Location: [facility];  Service: General;  Laterality: Left;    SOCIAL HISTORY: Social History   Socioeconomic History   Marital status: Married    Spouse name: Not on file   Number of children: 2   Years of education: Not on file   Highest education level: Not on file  Occupational History    Comment: Hair dresser  Tobacco Use   Smoking status: Never   Smokeless tobacco: Never  Vaping Use   Vaping Use: Never used  Substance and Sexual Activity   Alcohol use: Yes    Comment: socially   Drug use: No   Sexual activity: Yes    Partners: Male  Other Topics Concern   Not on file  Social History Narrative   Pt is a hair stylist   Married (3rd marriage)   2 sons (both grown)   Enjoys yard work, hiking, Scientist, clinical (histocompatibility and immunogenetics)   Complete 2 year cosmetology   Has one Neurosurgeon   Social Determinants of Radio broadcast assistant Strain: Not on Art therapist Insecurity: Not on file  Transportation Needs: Not on file  Physical Activity: Not on file  Stress: Not on file  Social Connections: Not on file  Intimate Partner Violence: Not on file    FAMILY HISTORY: Family History  Problem Relation Age of Onset   Hypertension Mother    Heart attack  Mother    Heart disease Mother        Atrial fibrillation   Obstructive Sleep Apnea Mother    Hypertension Brother    Stroke Maternal Grandmother 60   Breast cancer Other 31       PGM's sister   Colon cancer Neg Hx    Esophageal cancer Neg Hx     Review of Systems  Constitutional:  Positive for fatigue. Negative for appetite change, chills, fever and unexpected weight change.  HENT:   Negative for hearing loss, lump/mass and trouble swallowing.   Eyes:  Negative for eye problems and icterus.  Respiratory:  Negative for chest tightness, cough and shortness of breath.   Cardiovascular:  Negative for chest pain, leg swelling and palpitations.  Gastrointestinal:  Negative for abdominal distention, abdominal pain, constipation, diarrhea, nausea and vomiting.  Endocrine: Negative for hot flashes.  Genitourinary:  Negative for difficulty urinating.   Musculoskeletal:  Negative for arthralgias and myalgias.  Skin:  Negative for itching and rash.  Neurological:  Negative for dizziness, extremity weakness, headaches and numbness.       Neuropathy.  Hematological:  Negative for adenopathy. Does not bruise/bleed easily.  Psychiatric/Behavioral:  Negative for depression. The patient is not nervous/anxious.       PHYSICAL EXAMINATION  ECOG PERFORMANCE STATUS: 1 - Symptomatic but completely ambulatory  Vitals:   05/07/22 1140  BP: (!) 144/77  Pulse: 68  Resp: 16  Temp: 97.8 F (36.6 C)  SpO2: 100%   She is status post right lumpectomy and right axillary sentinel lymph node biopsy.  Both surgical scars look appropriate for postop day 3.  No concern for infection  LABORATORY DATA:  CBC    Component Value Date/Time   WBC 2.5 (L) 05/07/2022 1125   WBC 8.1 01/05/2022 1531   RBC 3.22 (L) 05/07/2022 1125   HGB 10.9 (L) 05/07/2022 1125   HCT 32.3 (L) 05/07/2022 1125   PLT 174 05/07/2022 1125   MCV 100.3 (H) 05/07/2022 1125   MCH 33.9 05/07/2022 1125   MCHC 33.7 05/07/2022 1125    RDW 11.8 05/07/2022 1125   LYMPHSABS 0.8 05/07/2022 1125   MONOABS 0.3 05/07/2022 1125   EOSABS 0.1 05/07/2022 1125   BASOSABS 0.0 05/07/2022 1125    CMP     Component Value Date/Time   NA 140 03/22/2022 1013   K 4.0 03/22/2022 1013   CL 107 03/22/2022 1013   CO2 29 03/22/2022 1013   GLUCOSE 79 03/22/2022 1013   BUN 11 03/22/2022 1013   CREATININE 0.47 03/22/2022 1013   CALCIUM 9.1 03/22/2022 1013   PROT 5.9 (L) 03/22/2022 1013   ALBUMIN 3.8 03/22/2022 1013   AST 26 03/22/2022 1013   ALT 27 03/22/2022 1013   ALKPHOS 55 03/22/2022 1013   BILITOT 0.4 03/22/2022 1013   GFRNONAA >60 03/22/2022 1013   GFRAA >60 05/30/2019 1105    ASSESSMENT and THERAPY PLAN:   #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  Today's postop day 3.  She apparently had some delay in surgery because of tooth abscess.   After she left, while I was documenting, of note is of the pathology report is updated.  She appears to have complete pathologic response.  She understands that we do have an upcoming de-escalation study where we enroll patients to receive either immunotherapy which is standard of care versus no immunotherapy since there have been good results in patients with pathologic complete response. She is interested in participating in the study if possible.  I tried to call her to review the pathology report but she did not answer.  I will try to call her again to discuss.   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  All questions were answered. The patient knows to call the clinic with any problems, questions or concerns. We can certainly see the patient much sooner if necessary.  Total encounter time:30 minutes*in face-to-face visit time, chart review, lab review, care coordination, order entry, and documentation  of the encounter time.  *Total Encounter Time as defined by the Centers for Medicare and Medicaid Services includes, in addition to the face-to-face time of a patient visit (documented in the note above) non-face-to-face time: obtaining and reviewing outside history, ordering and reviewing medications, tests or procedures, care coordination (communications with other health care professionals or caregivers) and documentation in the medical record.

## 2022-05-07 NOTE — Telephone Encounter (Signed)
Called pt to confirm she spoke with MD regarding results. She verbalized she s/w MD.

## 2022-05-08 DIAGNOSIS — Z171 Estrogen receptor negative status [ER-]: Secondary | ICD-10-CM

## 2022-05-08 NOTE — Research (Signed)
Title: [employer]PCR  Spoke with Ms Kaylor regarding study. She is not interested in pursuing enrollment due to potential for assignment to treatment arm. Sent inbox message to Dr Chryl Heck to update her.  Marjie Skiff Riddick Nuon, RN, BSN, [facility] She  Her  Hers Clinical Research Nurse [facility] Direct Dial [phone]  Pager [phone] 05/08/2022 2:22 PM

## 2022-05-10 ENCOUNTER — Encounter: Payer: Self-pay | Admitting: *Deleted

## 2022-05-11 ENCOUNTER — Inpatient Hospital Stay: Payer: BC Managed Care – PPO | Admitting: Hematology and Oncology

## 2022-05-28 NOTE — Progress Notes (Signed)
Radiation Oncology         [phone] ________________________________  Name: Wendy Webb        MRN: [id number]  Date of Service: 05/30/2022 DOB: 02-Jul-1965  [id number], Aundra Millet, PA-C  Benay Pike, MD     REFERRING PHYSICIAN: Benay Pike, MD   DIAGNOSIS: The encounter diagnosis was Malignant neoplasm of upper-outer quadrant of right breast in female, estrogen receptor negative ([place]).   HISTORY OF PRESENT ILLNESS: Wendy Webb is a 57 y.o. female originally seen in the multidisciplinary breast clinic for a new diagnosis of right breast cancer. The patient was noted to have a palpable lump in the right breast.  This was noted in the upper right breast and by ultrasound was located in the 12 o'clock position measuring up to 2.5 cm.  No evidence of any axillary adenopathy was present.  She underwent a biopsy on 09/26/2021 that showed grade 3 invasive ductal carcinoma of the right breast.  Her tumor was ER/PR negative, HER2 was equivocal and reflexed to negative.    Since her last visit she underwent an MRI of the breast on 10/12/2021 showing her known cancer measuring 2.7 cm in the right breast.  2 additional indeterminate masses were seen in the right breast measuring 6 mm in the central to slightly medial aspect and 5 mm in the lower outer quadrant.  No axillary adenopathy was identified in either axilla, and her left breast was negative for malignancy.  She did undergo additional biopsies of the 2 new findings in the right breast on 10/19/2021 the medial aspect biopsy showed fibroadenoma and fibrocystic changes.  The lower outer quadrant biopsy showed columnar cell and fibrocystic change and benign intra mammary lymph node.  No malignancy was noted.  She subsequently began systemic chemotherapy on 10/20/2021 and completed her chemotherapy on 03/08/2022.  Posttreatment MRI on 03/26/2022 showed a marked decrease in the size of her known right breast cancer with only small degree of  residual non-mass enhancement measuring 1.3 cm was noted.  No other suspicious or abnormal findings were identified.  She underwent a right lumpectomy with sentinel lymph node biopsy on 05/04/2022 with Dr. Marlou Starks.  Final pathology showed fibrosis with inflammation and dystrophic calcifications consistent with treatment effect with no remaining malignancy seen.  Additional posterior and superior margins were also negative.  A right axillary cyst was consistent with a subcutaneous adipose tissue with focal inflammation suggesting a ruptured cyst, and 2 sentinel nodes were negative for disease.  She is seen to discuss adjuvant radiotherapy to the right breast.  Of note she had sent a message several weeks previous about concerns related to skin changes of radiotherapy.    PREVIOUS RADIATION THERAPY: No   PAST MEDICAL HISTORY:  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[place])    09/26/2021 (right breast biopsy)   Mild mitral regurgitation 06/2019   Palpitations    PONV (postoperative nausea and vomiting)        PAST SURGICAL HISTORY: Past Surgical History:  Procedure Laterality Date   BREAST BIOPSY  05/03/2022   MM RT RADIOACTIVE SEED LOC MAMMO GUIDE 05/03/2022 [facility] MAMMOGRAPHY   BREAST LUMPECTOMY WITH RADIOACTIVE SEED AND SENTINEL LYMPH NODE BIOPSY Right 05/04/2022   Procedure: RIGHT BREAST LUMPECTOMY WITH RADIOACTIVE SEED AND SENTINEL LYMPH NODE BIOPSY;  Surgeon: Jovita Kussmaul, MD;  Location: [facility];  Service: General;  Laterality: Right;   CHOLECYSTECTOMY     CYST REMOVAL TRUNK Right 05/04/2022   Procedure: CYST REMOVAL AXILLA;  Surgeon: Jovita Kussmaul, MD;  Location: [place];  Service: General;  Laterality: Right;   GALLBLADDER SURGERY     PORTACATH PLACEMENT Left 10/18/2021   Procedure: PLA CEMENT OF PORT;  Surgeon: Jovita Kussmaul, MD;  Location: [place];  Service: General;  Laterality: Left;     FAMILY HISTORY:  Family History  Problem Relation Age of Onset   Hypertension Mother    Heart attack Mother    Heart disease Mother        Atrial fibrillation   Obstructive Sleep Apnea Mother    Hypertension Brother    Stroke Maternal Grandmother 38   Breast cancer Other 100       PGM's sister   Colon cancer Neg Hx    Esophageal cancer Neg Hx      SOCIAL HISTORY:  reports that she has never smoked. She has never used smokeless tobacco. She reports current alcohol use. She reports that she does not use drugs. The patient is married and lives in [place]. She is a Theme park manager.   ALLERGIES: Ace inhibitors and Shellfish allergy   MEDICATIONS:  Current Outpatient Medications  Medication Sig Dispense Refill   cholecalciferol (VITAMIN D3) 25 MCG (1000 UNIT) tablet Take 1,000 Units by mouth daily.     cyanocobalamin 1000 MCG tablet Take 1,000 mcg by mouth daily.     dexamethasone (DECADRON) 4 MG tablet Take 1 tablets once a day for 3 days after carboplatin and AC chemotherapy. Take with food. 30 tablet 1   fluconazole (DIFLUCAN) 100 MG tablet Take 1 tablet (100 mg total) by mouth daily. 30 tablet 0   lidocaine-prilocaine (EMLA) cream Apply to affected area once 30 g 3   LORazepam (ATIVAN) 0.5 MG tablet Take 0.25 mg by mouth every morning.     magic mouthwash w/lidocaine SOLN 1:1:1 ratio of lidocaine 2%,maalox,diphenhydramine 12.34m/5ml 5-10 cc swish swallow or spit QID prn 240 mL 2   Misc Natural Products (TART CHERRY ADVANCED PO) Take 1 tablet by mouth daily.     Multiple Vitamins-Minerals (MULTIVIT/MULTIMINERAL ADULT) LIQD Take 30 mLs by mouth daily.     oxyCODONE (ROXICODONE) 5 MG immediate release tablet Take 1 tablet (5 mg total) by mouth every 6 (six) hours as needed for severe pain. 10 tablet 0   PREVIDENT 5000 BOOSTER PLUS 1.1 % PSTE Place onto teeth as directed.     prochlorperazine (COMPAZINE) 10 MG  tablet Take 1 tablet (10 mg total) by mouth every 6 (six) hours as needed (Nausea or vomiting). 30 tablet 1   No current facility-administered medications for this visit.     REVIEW OF SYSTEMS: On review of systems, the patient reports she is doing well since surgery. She has some firmness along her RUE and sees PT on Monday for post surgical assessment. She reports her left fingertips and left toes are having numbness since chemotherapy, but otherwise she is doing very well and denies any other concerns.      PHYSICAL EXAM:  Wt Readings from Last 3 Encounters:  05/07/22 157 lb 9.6 oz (71.5 kg)  05/04/22 152 lb 8.9 oz (69.2 kg)  03/22/22 160 lb 4 oz (72.7 kg)   Temp Readings from Last 3 Encounters:  05/07/22 97.8 F (36.6 C) (Temporal)  05/04/22 97.9 F (36.6 C) (Oral)  03/22/22 97.8 F (36.6 C) (  Temporal)   BP Readings from Last 3 Encounters:  05/07/22 (!) 144/77  05/04/22 135/80  03/22/22 (!) 143/83   Pulse Readings from Last 3 Encounters:  05/07/22 68  05/04/22 80  03/22/22 74    In general this is a well appearing caucasian female in no acute distress. She's alert and oriented x4 and appropriate throughout the examination. Cardiopulmonary assessment is negative for acute distress and she exhibits normal effort.  Her right breast and axillary incision sites are healing well without erythema, separation, or drainage. She has firmness in the brachial region of the upper arm extending into the medial forearm but no cords per se can be appreciated.   ECOG = 1  0 - Asymptomatic (Fully active, able to carry on all predisease activities without restriction)  1 - Symptomatic but completely ambulatory (Restricted in physically strenuous activity but ambulatory and able to carry out work of a light or sedentary nature. For example, light housework, office work)  2 - Symptomatic, <50% in bed during the day (Ambulatory and capable of all self care but unable to carry out any work  activities. Up and about more than 50% of waking hours)  3 - Symptomatic, >50% in bed, but not bedbound (Capable of only limited self-care, confined to bed or chair 50% or more of waking hours)  4 - Bedbound (Completely disabled. Cannot carry on any self-care. Totally confined to bed or chair)  5 - Death   Eustace Pen MM, Creech RH, Tormey DC, et al. [phone]). "Toxicity and response criteria of the [facility]". [place] Oncol. 5 (6): 649-55    LABORATORY DATA:  Lab Results  Component Value Date   WBC 2.5 (L) 05/07/2022   HGB 10.9 (L) 05/07/2022   HCT 32.3 (L) 05/07/2022   MCV 100.3 (H) 05/07/2022   PLT 174 05/07/2022   Lab Results  Component Value Date   NA 139 05/07/2022   K 3.8 05/07/2022   CL 106 05/07/2022   CO2 28 05/07/2022   Lab Results  Component Value Date   ALT 32 05/07/2022   AST 39 05/07/2022   ALKPHOS 71 05/07/2022   BILITOT 0.3 05/07/2022      RADIOGRAPHY: MM Breast Surgical Specimen  Result Date: 05/04/2022 CLINICAL DATA:  Surgical specimen of a radioactive seed localized right breast lesion. EXAM: SPECIMEN RADIOGRAPH OF THE RIGHT BREAST COMPARISON:  Previous exam(s). FINDINGS: Status post excision of the right breast. The radioactive seed and biopsy marker clip are present, completely intact, and were marked for pathology. IMPRESSION: Specimen radiograph of the right breast. Electronically Signed   By: Lajean Manes M.D.   On: 05/04/2022 16:09  NM Sentinel Node Inj-No Rpt (Breast)  Result Date: 05/04/2022 Sulfur Colloid was injected by the Nuclear Medicine Technologist for sentinel lymph node localization.   MM RT RADIOACTIVE SEED LOC MAMMO GUIDE  Result Date: 05/03/2022 CLINICAL DATA:  57 year old female presenting for radioactive seed localization of the right breast prior to lumpectomy. EXAM: MAMMOGRAPHIC GUIDED RADIOACTIVE SEED LOCALIZATION OF THE RIGHT BREAST COMPARISON:  Previous exam(s). FINDINGS: Patient presents for  radioactive seed localization prior to right breast lumpectomy. I met with the patient and we discussed the procedure of seed localization including benefits and alternatives. We discussed the high likelihood of a successful procedure. We discussed the risks of the procedure including infection, bleeding, tissue injury and further surgery. We discussed the low dose of radioactivity involved in the procedure. Informed, written consent was given. The usual time-out protocol  was performed immediately prior to the procedure. Using mammographic guidance, sterile technique, 1% lidocaine and an I-125 radioactive seed, the mass with the ribbon shaped biopsy marking clip in the superior posterior right breast was localized using a superior approach. The follow-up mammogram images confirm the seed in the expected location and were marked for Dr. Marlou Starks. Follow-up survey of the patient confirms presence of the radioactive seed. Order number of I-125 seed:  [id number]. Total activity:  [id number] millicurie reference Date: 04/24/2022 The patient tolerated the procedure well and was released from the [facility]. She was given instructions regarding seed removal. IMPRESSION: Radioactive seed localization right breast. No apparent complications. Electronically Signed   By: Ammie Ferrier M.D.   On: 05/03/2022 13:53      IMPRESSION/PLAN: 1. Stage IIB, cT2N0M0, grade 3, triple negative invasive ductal carcinoma of the right breast with complete pathologic response to neoadjuvant chemotherapy. Dr. Lisbeth Renshaw discusses the pathology findings and reviews the nature of triple negative breast disease.  She has done quite well since her treatment and had an excellent response to the stomach treatment with chemo.  Dr. Lisbeth Renshaw recommends external radiotherapy to the breast  to reduce risks of local recurrence. We discussed the risks, benefits, short, and long term effects of radiotherapy, as well as the curative intent, and the patient is  interested in proceeding. Dr. Lisbeth Renshaw discusses the delivery and logistics of radiotherapy and Dr. Lisbeth Renshaw recommends 4  weeks of radiotherapy to the right breast. Written consent is obtained and placed in the chart, a copy was provided to the patient.  2. Skin care during radiation. Based on bra size and cup measurements, she is a candidate for Mepitel dressing. We discussed that we have reached out to the representatives to see if we can have samples to offer. We did discuss the use is during the entire radiation course without removal day to day, and for this reason she states she may not be as motivated to try the product. I will reach out once I hear something from the product representative. We will also contact central supply and pharmacy to see if they can assist in finding access to this product through the health system.     In a visit lasting 60 minutes, greater than 50% of the time was spent face to face reviewing her case, as well as in preparation of, discussing, and coordinating the patient's care.  The above documentation reflects my direct findings during this shared patient visit. Please see the separate note by Dr. Lisbeth Renshaw on this date for the remainder of the patient's plan of care.    Carola Rhine, [facility]    **Disclaimer: This note was dictated with voice recognition software. Similar sounding words can inadvertently be transcribed and this note may contain transcription errors which may not have been corrected upon publication of note.**

## 2022-05-29 ENCOUNTER — Encounter (HOSPITAL_COMMUNITY): Payer: Self-pay

## 2022-05-30 ENCOUNTER — Ambulatory Visit
Admission: RE | Admit: 2022-05-30 | Discharge: 2022-05-30 | Disposition: A | Discharge status: Home / Self Care | Payer: BC Managed Care – PPO | Source: Ambulatory Visit | Attending: Radiation Oncology | Admitting: Radiation Oncology

## 2022-05-30 ENCOUNTER — Encounter: Payer: Self-pay | Admitting: Radiation Oncology

## 2022-05-30 VITALS — BP 134/73 | HR 65 | Temp 97.8°F | Resp 18 | Ht 67.0 in | Wt 158.8 lb

## 2022-05-30 DIAGNOSIS — K589 Irritable bowel syndrome without diarrhea: Secondary | ICD-10-CM | POA: Diagnosis not present

## 2022-05-30 DIAGNOSIS — Z7952 Long term (current) use of systemic steroids: Secondary | ICD-10-CM | POA: Diagnosis not present

## 2022-05-30 DIAGNOSIS — I10 Essential (primary) hypertension: Secondary | ICD-10-CM | POA: Insufficient documentation

## 2022-05-30 DIAGNOSIS — Z171 Estrogen receptor negative status [ER-]: Secondary | ICD-10-CM | POA: Insufficient documentation

## 2022-05-30 DIAGNOSIS — Z510 Encounter for antineoplastic radiation therapy: Secondary | ICD-10-CM | POA: Insufficient documentation

## 2022-05-30 DIAGNOSIS — C50411 Malignant neoplasm of upper-outer quadrant of right female breast: Secondary | ICD-10-CM | POA: Insufficient documentation

## 2022-05-30 DIAGNOSIS — Z170 Estrogen receptor positive status [ER+]: Secondary | ICD-10-CM | POA: Insufficient documentation

## 2022-05-30 DIAGNOSIS — Z803 Family history of malignant neoplasm of breast: Secondary | ICD-10-CM | POA: Diagnosis not present

## 2022-05-30 DIAGNOSIS — Z79899 Other long term (current) drug therapy: Secondary | ICD-10-CM | POA: Diagnosis not present

## 2022-05-30 NOTE — Progress Notes (Signed)
Nursing interview for Malignant neoplasm of upper-outer quadrant of right breast in female, estrogen receptor negative ([place]).  Patient identity verified. Patient reports muscle tightness (possible Cording) w/ RT arm. No other issues reported at this time.  Meaningful use complete. Postmenopausal- No chances of pregnancy.  BP 134/73 (BP Location: Left Arm, Patient Position: Sitting, Cuff Size: Normal)   Pulse 65   Temp 97.8 F (36.6 C) (Temporal)   Resp 18   Ht 5' 7"$  (1.702 m)   Wt 158 lb 12.8 oz (72 kg)   LMP 05/14/2019   SpO2 100%   BMI 24.87 kg/m   This concludes the interview.   Leandra Kern, LPN

## 2022-06-03 NOTE — Therapy (Signed)
OUTPATIENT PHYSICAL THERAPY BREAST CANCER POST OP FOLLOW UP   Patient Name: Wendy Webb MRN: [id number] DOB:September 11, 1965, 57 y.o., female Today's Date: 06/04/2022  END OF SESSION:  PT End of Session - 06/04/22 1050     Visit Number 2    Number of Visits 9    Date for PT Re-Evaluation 07/02/22    Authorization Type not in epic as of 06/04/22    PT Start Time 1003    PT Stop Time 1040    PT Time Calculation (min) 37 min    Activity Tolerance Patient tolerated treatment well    Behavior During Therapy [facility] for tasks assessed/performed             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[place])    09/26/2021 (right breast biopsy)   Mild mitral regurgitation 06/2019   Palpitations    PONV (postoperative nausea and vomiting)    Past Surgical History:  Procedure Laterality Date   BREAST BIOPSY  05/03/2022   MM RT RADIOACTIVE SEED LOC MAMMO GUIDE 05/03/2022 [facility] MAMMOGRAPHY   BREAST LUMPECTOMY WITH RADIOACTIVE SEED AND SENTINEL LYMPH NODE BIOPSY Right 05/04/2022   Procedure: RIGHT BREAST LUMPECTOMY WITH RADIOACTIVE SEED AND SENTINEL LYMPH NODE BIOPSY;  Surgeon: Jovita Kussmaul, MD;  Location: [place];  Service: General;  Laterality: Right;   CHOLECYSTECTOMY     CYST REMOVAL TRUNK Right 05/04/2022   Procedure: CYST REMOVAL AXILLA;  Surgeon: Jovita Kussmaul, MD;  Location: [place];  Service: General;  Laterality: Right;   GALLBLADDER SURGERY     PORTACATH PLACEMENT Left 10/18/2021   Procedure: PLA CEMENT OF PORT;  Surgeon: Jovita Kussmaul, MD;  Location: [place];  Service: General;  Laterality: Left;   Patient Active Problem List   Diagnosis Date Noted   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 Autumn Messing  REFERRING DIAG: Rt breast cancer  THERAPY DIAG:  Abnormal posture  Aftercare following surgery for neoplasm  Malignant neoplasm of upper-outer quadrant of right breast in female, estrogen receptor positive ([place])  At risk for lymphedema  Rationale for Evaluation and Treatment: Rehabilitation  ONSET DATE: 09/25/21  SUBJECTIVE:  SUBJECTIVE STATEMENT: Will start radiation next week.  I had a hard time going overhead with having some cording.  I feel it pull down the pinky side of the arm   PERTINENT HISTORY:  Patient was diagnosed on 09/25/2021 with right grade 3 invasive ductal carcinoma breast cancer. It measures 2.5 cm and is located in the upper outer quadrant. It is triple negative with a Ki67 of 95%. Completed chemotherapy AC, carboplatin, Paclitaxel, and keytruda. Rt lumpectomy and SLNB on 05/04/22 with 2 negative nodes removed.    PATIENT GOALS:  Reassess how my recovery is going related to arm function, pain, and swelling.  PAIN:  Are you having pain? I only get pain if stretch the arm super far   PRECAUTIONS: Recent Surgery, right UE Lymphedema risk  ACTIVITY LEVEL / LEISURE: back to normal   OBJECTIVE:   PATIENT SURVEYS:  QUICK DASH: 4.55% from 4.55%  OBSERVATIONS: Well healed incision, no axillary edema noted,  cording noted from axilla to ulnar side of the wrist . 3 good sized cords in the antecubital fossa.   POSTURE:  Rounded shoulders   LYMPHEDEMA  ASSESSMENT:   UPPER EXTREMITY AROM/PROM:   A/PROM RIGHT   eval   06/04/22  Shoulder extension 42 45  Shoulder flexion 154 130 pull   Shoulder abduction 163 105 pull from cording   Shoulder internal rotation 64   Shoulder external rotation 80 90                          (Blank rows = not tested)   A/PROM LEFT   eval  Shoulder extension 47  Shoulder flexion 142  Shoulder abduction 161  Shoulder internal rotation 60  Shoulder external rotation 90                          (Blank rows = not tested)     CERVICAL AROM: All within normal limits   UPPER EXTREMITY STRENGTH: WFL    LYMPHEDEMA ASSESSMENTS:    LANDMARK RIGHT   eval 06/04/22    31.2  10 cm proximal to olecranon process 25.5 28.4  Olecranon process 23 24.6    23.3  10 cm proximal to ulnar styloid process 19.8 20.5  Just proximal to ulnar styloid process 14 14.6  Across hand at thumb web space 17.5 18.5  At base of 2nd digit 5.9 5.9  (Blank rows = not tested) Length 6.5cm   LANDMARK LEFT   eval 06/04/22  15cm  31.7  10 cm proximal to olecranon process 26.7 29  Olecranon process 23.5 25  15cm  22  10 cm proximal to ulnar styloid process 19.3 19.1  Just proximal to ulnar styloid process 13.3 14.6  Across hand at thumb web space 17.5 18.5  At base of 2nd digit 5.8 6.1  (Blank rows = not tested)  TODAY'S TREATMENT Pt permission and consent throughout each step of examination and treatment with modification and draping if requested when working on sensitive areas  06/04/22 PROM to the right shoulder with edu on cording throughout and pinning at the elbow STM using opposing force pressure and along the cords with cocoa butter - gentle pressure overall for cording release.  Performed HEP per below with performance of each x 2  PATIENT EDUCATION:  Education details: cording, POC, new stretches  Person educated: Patient and Spouse Education method: Explanation, Demonstration, Verbal cues, and Handouts Education  comprehension: verbalized  understanding, returned demonstration, and needs further education  HOME EXERCISE PROGRAM: Access Code: [email or web address] URL: [email or web address] Date: 06/04/2022 Prepared by: Shan Levans  Exercises - Supine Shoulder Flexion Extension AAROM with Dowel  - 1-2 x daily - 7 x weekly - 10 reps - 5 seconds hold - Supine Chest Stretch with Elbows Bent  - 1 x daily - 7 x weekly - 1 sets - 3 reps - 30-60seconds hold - Standing Shoulder Flexion Wall Walk  - 1 x daily - 7 x weekly - 1-3 sets - 10 reps - 20-30 seconds hold - Standing Pec Stretch at Wall  - 1 x daily - 7 x weekly - 1-3 sets - 10 reps - 20-30 seconds hold   ASSESSMENT:  CLINICAL IMPRESSION: Pt presents 5 weeks post Rt lumpectomy and chemotherapy with significant cording in the UE limiting AROM.  She is limited in flexion and abduction only.  She tolerated her first session well and reported that it felt better. already  Pt will benefit from skilled therapeutic intervention to improve on the following deficits: Decreased knowledge of precautions, impaired UE functional use, pain, decreased ROM, postural dysfunction.   PT treatment/interventions: ADL/Self care home management, Therapeutic exercises, Patient/Family education, Self Care, Manual therapy, and Re-evaluation   GOALS: Goals reviewed with patient? Yes  LONG TERM GOALS:  (STG=LTG)  GOALS Name Target Date  Goal status  1 Pt will demonstrate she has regained full shoulder ROM and function post operatively compared to baselines.  Baseline: 07/01/32 INITIAL  2 Pt will be ind with final HEP 06/04/22 INITIAL  3 Pt will be scheduled out for SOZO surveillance and ABC class for lymphedema risk reduction 06/04/22 Initial          PLAN:  PT FREQUENCY/DURATION: 2x per week x 4 weeks and SOZO  PLAN FOR NEXT SESSION: Rt UE cording release/PROM/stretches Need to complete post op education: scar massage, bra use, ABC class scheduling, SOZO  scheduling as able *Needs to schedule more - only did the 1 time due to busy SOZO day   [facility]  [address], [address]  [place] [address]  [phone]  After Breast Cancer Class It is recommended you attend the ABC class to be educated on lymphedema risk reduction. This class is free of charge and lasts for 1 hour. It is a 1-time class. You will need to download the Webex app either on your phone or computer. We will send you a link the night before or the morning of the class. You should be able to click on that link to join the class. This is not a confidential class. You don't have to turn your camera on, but other participants may be able to see your email address.  Scar massage You can begin gentle scar massage to you incision sites. Gently place one hand on the incision and move the skin (without sliding on the skin) in various directions. Do this for a few minutes and then you can gently massage either coconut oil or vitamin E cream into the scars.  Compression garment You should continue wearing your compression bra until you feel like you no longer have swelling.  Home exercise Program Continue doing the exercises you were given until you feel like you can do them without feeling any tightness at the end.   Walking Program Studies show that 30 minutes of walking per day (fast enough to elevate your heart rate) can significantly reduce the risk of a cancer recurrence.  If you can't walk due to other medical reasons, we encourage you to find another activity you could do (like a stationary bike or water exercise).  Posture After breast cancer surgery, people frequently sit with rounded shoulders posture because it puts their incisions on slack and feels better. If you sit like this and scar tissue forms in that position, you can become very tight and have pain sitting or standing with good posture. Try to be aware of your posture and sit and stand up  tall to heal properly.  Follow up PT: It is recommended you return every 3 months for the first 3 years following surgery to be assessed on the SOZO machine for an L-Dex score. This helps prevent clinically significant lymphedema in 95% of patients. These follow up screens are 10 minute appointments that you are not billed for.  Stark Bray, PT 06/04/2022, 10:52 AM

## 2022-06-04 ENCOUNTER — Telehealth: Payer: Self-pay | Admitting: Radiation Oncology

## 2022-06-04 ENCOUNTER — Ambulatory Visit: Payer: BC Managed Care – PPO | Attending: General Surgery | Admitting: Rehabilitation

## 2022-06-04 ENCOUNTER — Encounter: Payer: Self-pay | Admitting: Rehabilitation

## 2022-06-04 DIAGNOSIS — C50411 Malignant neoplasm of upper-outer quadrant of right female breast: Secondary | ICD-10-CM | POA: Diagnosis present

## 2022-06-04 DIAGNOSIS — R293 Abnormal posture: Secondary | ICD-10-CM | POA: Diagnosis present

## 2022-06-04 DIAGNOSIS — Z170 Estrogen receptor positive status [ER+]: Secondary | ICD-10-CM | POA: Insufficient documentation

## 2022-06-04 DIAGNOSIS — Z483 Aftercare following surgery for neoplasm: Secondary | ICD-10-CM | POA: Insufficient documentation

## 2022-06-04 DIAGNOSIS — Z9189 Other specified personal risk factors, not elsewhere classified: Secondary | ICD-10-CM | POA: Insufficient documentation

## 2022-06-04 NOTE — Telephone Encounter (Signed)
I called the patient to let her know that we were able to acquire mepitel dressings from [employer] and that I'm meeting with the product rep. She was interested in the product prior to her consultation last week, and is open to being the first patient to use this product on. I'll plan to see her next week for her Port and Treat at which time I'll apply the product for her in the linac area.

## 2022-06-06 ENCOUNTER — Telehealth: Payer: Self-pay | Admitting: Hematology and Oncology

## 2022-06-06 ENCOUNTER — Encounter: Payer: Self-pay | Admitting: *Deleted

## 2022-06-06 DIAGNOSIS — C50411 Malignant neoplasm of upper-outer quadrant of right female breast: Secondary | ICD-10-CM | POA: Diagnosis not present

## 2022-06-06 NOTE — Telephone Encounter (Signed)
Spoke with patient confirming upcoming appointment  

## 2022-06-07 ENCOUNTER — Ambulatory Visit: Payer: BC Managed Care – PPO

## 2022-06-07 VITALS — Wt 154.2 lb

## 2022-06-07 DIAGNOSIS — Z170 Estrogen receptor positive status [ER+]: Secondary | ICD-10-CM

## 2022-06-07 DIAGNOSIS — Z9189 Other specified personal risk factors, not elsewhere classified: Secondary | ICD-10-CM

## 2022-06-07 DIAGNOSIS — R293 Abnormal posture: Secondary | ICD-10-CM

## 2022-06-07 DIAGNOSIS — Z483 Aftercare following surgery for neoplasm: Secondary | ICD-10-CM

## 2022-06-07 NOTE — Therapy (Addendum)
OUTPATIENT PHYSICAL THERAPY BREAST CANCER POST OP FOLLOW UP   Patient Name: Wendy Webb MRN: [id number] DOB:[birth year], 57 y.o., female Today's Date: 06/07/2022  END OF SESSION:  PT End of Session - 06/07/22 1006     Visit Number 3    Number of Visits 9    Date for PT Re-Evaluation 07/02/22    PT Start Time 1005    PT Stop Time 1107    PT Time Calculation (min) 62 min    Activity Tolerance Patient tolerated treatment well    Behavior During Therapy [facility] for tasks assessed/performed             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[place])    09/26/2021 (right breast biopsy)   Mild mitral regurgitation 06/2019   Palpitations    PONV (postoperative nausea and vomiting)    Past Surgical History:  Procedure Laterality Date   BREAST BIOPSY  05/03/2022   MM RT RADIOACTIVE SEED LOC MAMMO GUIDE 05/03/2022 [facility] MAMMOGRAPHY   BREAST LUMPECTOMY WITH RADIOACTIVE SEED AND SENTINEL LYMPH NODE BIOPSY Right 05/04/2022   Procedure: RIGHT BREAST LUMPECTOMY WITH RADIOACTIVE SEED AND SENTINEL LYMPH NODE BIOPSY;  Surgeon: Jovita Kussmaul, MD;  Location: [place];  Service: General;  Laterality: Right;   CHOLECYSTECTOMY     CYST REMOVAL TRUNK Right 05/04/2022   Procedure: CYST REMOVAL AXILLA;  Surgeon: Jovita Kussmaul, MD;  Location: [place];  Service: General;  Laterality: Right;   GALLBLADDER SURGERY     PORTACATH PLACEMENT Left 10/18/2021   Procedure: PLA CEMENT OF PORT;  Surgeon: Jovita Kussmaul, MD;  Location: [place];  Service: General;  Laterality: Left;   Patient Active Problem List   Diagnosis Date Noted   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 Autumn Messing  REFERRING DIAG: Rt breast cancer  THERAPY DIAG:  Abnormal posture  Aftercare following surgery for neoplasm  Malignant neoplasm of upper-outer quadrant of right breast in female, estrogen receptor positive ([place])  At risk for lymphedema  Rationale for Evaluation and Treatment: Rehabilitation  ONSET DATE: 09/25/21  SUBJECTIVE:  SUBJECTIVE STATEMENT: My arm is already feeling so much better! I can straighten my elbow out now. I could sleep on my Lt side last night too.  PERTINENT HISTORY:  Patient was diagnosed on 09/25/2021 with right grade 3 invasive ductal carcinoma breast cancer. It measures 2.5 cm and is located in the upper outer quadrant. It is triple negative with a Ki67 of 95%. Completed chemotherapy AC, carboplatin, Paclitaxel, and keytruda. Rt lumpectomy and SLNB on 05/04/22 with 2 negative nodes removed.    PATIENT GOALS:  Reassess how my recovery is going related to arm function, pain, and swelling.  PAIN:  Are you having pain? No, the stretches have been helping   PRECAUTIONS: Recent Surgery, right UE Lymphedema risk  ACTIVITY LEVEL / LEISURE: back to normal   OBJECTIVE:   PATIENT SURVEYS:  QUICK DASH: 4.55% from 4.55%  OBSERVATIONS: Well healed incision, no axillary edema noted,  cording noted from axilla to ulnar side of the wrist . 3 good sized cords in the antecubital fossa.   POSTURE:  Rounded shoulders   LYMPHEDEMA ASSESSMENT:   UPPER EXTREMITY AROM/PROM:   A/PROM RIGHT   eval   06/04/22   Shoulder extension 42 45  Shoulder flexion 154 130 pull   Shoulder abduction 163 105 pull from cording   Shoulder internal rotation 64   Shoulder external rotation 80 90                          (Blank rows = not tested)   A/PROM LEFT   eval  Shoulder extension 47  Shoulder flexion 142  Shoulder abduction 161  Shoulder internal rotation 60  Shoulder external rotation 90                          (Blank rows = not tested)     CERVICAL AROM: All within normal limits   UPPER EXTREMITY STRENGTH: WFL    LYMPHEDEMA ASSESSMENTS:    LANDMARK RIGHT   eval 06/04/22    31.2  10 cm proximal to olecranon process 25.5 28.4  Olecranon process 23 24.6    23.3  10 cm proximal to ulnar styloid process 19.8 20.5  Just proximal to ulnar styloid process 14 14.6  Across hand at thumb web space 17.5 18.5  At base of 2nd digit 5.9 5.9  (Blank rows = not tested) Length 6.5cm   LANDMARK LEFT   eval 06/04/22  15cm  31.7  10 cm proximal to olecranon process 26.7 29  Olecranon process 23.5 25  15cm  22  10 cm proximal to ulnar styloid process 19.3 19.1  Just proximal to ulnar styloid process 13.3 14.6  Across hand at thumb web space 17.5 18.5  At base of 2nd digit 5.8 6.1  (Blank rows = not tested)  TODAY'S TREATMENT Pt permission and consent throughout each step of examination and treatment with modification and draping if requested when working on sensitive areas  06/07/22: [place] done today earlier than her 3 months since surgery as pt has visible dorsal hand swelling. Her change from baseline indicates subclinical lymphedema but is still in the yellow. She was measured for a compression sleeve and gauntlet (black size III each) and was instructed in proper wear and care of this (handout issued, see below).  Also spent time educating pt in basics of the anatomy of the lymphatic system and answered her questions regarding this. Also spent  time educating her in some lymph reduction  factors like limiting her time when gardening (she does this for hours at times) as this can be overloading her already overtaxed lymph system at this time. So pt is going to work on decreasing her time and will be wearing her compression garments now for 1 month until next SOZO.  Manual Therapy MLD: Instructed pt in MLD while performing today since she has visible hand and breast swelling (pt asked therapist to assess this as she has noticed firmness at inferior medial aspect of breast as of late and she also has some mild peau d'orange present as well). MLD done as follows: Short neck, 5 diaphragmatic breaths (instruction required for correct technique), Lt axillary and Rt inguinal nodes, anterior inter-axillary and Rt axillo-inguinal anastomosis, then Rt medial breast and Rt UE working from proximal to distal, then retracing all steps having pt return some demo as time allowed. Handout was issued for this as well.  MFR done very briefly today as instructing in MLD took most of manual therapy time: this was done at antecubital fossa where pt still with very tight, tender and limiting cording   06/04/22 PROM to the right shoulder with edu on cording throughout and pinning at the elbow STM using opposing force pressure and along the cords with cocoa butter - gentle pressure overall for cording release.  Performed HEP per below with performance of each x 2  PATIENT EDUCATION:  Education details: Self MLD, wera and care of newly issued compression garments Person educated: Patient Education method: Explanation, Demonstration, Verbal cues, and Handouts Education comprehension: verbalized understanding, returned demonstration, and needs further education  HOME EXERCISE PROGRAM: Access Code: [email or web address] URL: [email or web address] Date: 06/04/2022 Prepared by: Shan Levans  Exercises - Supine Shoulder Flexion Extension AAROM with Dowel  - 1-2 x daily - 7 x weekly - 10 reps - 5 seconds  hold - Supine Chest Stretch with Elbows Bent  - 1 x daily - 7 x weekly - 1 sets - 3 reps - 30-60seconds hold - Standing Shoulder Flexion Wall Walk  - 1 x daily - 7 x weekly - 1-3 sets - 10 reps - 20-30 seconds hold - Standing Pec Stretch at Wall  - 1 x daily - 7 x weekly - 1-3 sets - 10 reps - 20-30 seconds hold   ASSESSMENT:  CLINICAL IMPRESSION: Pt returns after postop re-eval reporting her cording is already feeling much improved since performing HEP stretches. Today her SOZO was elevated so appropriate treatment done for this, see above. Then instructed pt in self MLD while performing, educating her in the basics of the anatomy of the lymphatic system. She does have some mild breast edema as well so instructed her how to include this in self MLD sequence by redirecting towards anterior inter-axillary anastomosis. Pt will benefit from further review of this and continued MFR to cording.   Pt will benefit from skilled therapeutic intervention to improve on the following deficits: Decreased knowledge of precautions, impaired UE functional use, pain, decreased ROM, postural dysfunction.   PT treatment/interventions: ADL/Self care home management, Therapeutic exercises, Patient/Family education, Self Care, Manual therapy, and Re-evaluation   GOALS: Goals reviewed with patient? Yes  LONG TERM GOALS:  (STG=LTG)  GOALS Name Target Date  Goal status  1 Pt will demonstrate she has regained full shoulder ROM and function post operatively compared to baselines.  Baseline: 07/01/32 INITIAL  2 Pt will be ind with final HEP 06/04/22 INITIAL  3 Pt will  be scheduled out for SOZO surveillance and ABC class for lymphedema risk reduction 06/04/22 Initial          PLAN:  PT FREQUENCY/DURATION: 2x per week x 4 weeks and SOZO  PLAN FOR NEXT SESSION: Issue foam for medial aspect of breast where mild lymphedema also present, how is wear of compression garments going ? Cont MFR to Rt UE cording  release/PROM/stretches; Retest SOZO around 07/04/22    [facility]  [address], [address]  [place] [address]  [phone]    Otelia Limes, PTA 06/07/2022, 12:29 PM  PLEASE KEEP YOUR COMPRESSION GARMENT ON DURING THE DAY TO GET THE BEST SWELLING REDUCTION. HERE ARE SOME ADDITIONAL TIPS: Do not sleep in your garment. If you have pain or notice swelling in your hand or at the top of your shoulder, call your therapist. This may be a sign that you need a different garment. 3.  Take good care of your garment so it lasts longer: Follow washing instructions on your garment label or box. Wash periodically using a mild detergent in warm water.  Do not use fabric softener or bleach.  Place garment in a mesh lingerie bag and use the gentle cycle of the washing machine or hand wash. Tumble dry low or lay flat to dry. TAKE CARE OF YOUR SKIN Apply a low pH moisturizing lotion to your skin daily Avoid scratching your skin Treat skin irritations quickly     Start with circles near neck above collarbones, 10 times each side  Deep Effective Breath   Standing, sitting, or laying down, place both hands on the belly. Take a deep breath IN, expanding the belly; then breath OUT, contracting the belly. Repeat __5__ times. Do __2-3__ sessions per day and before your self massage.  http://gt2.exer.us/866   Copyright  VHI. All rights reserved.  Axilla to Axilla - Sweep   On uninvolved side make 5 circles in the armpit, then pump _5__ times from involved armpit across chest to uninvolved armpit, making a pathway. Do _1__ time per day.  Copyright  VHI. All rights reserved.  Axilla to Inguinal Nodes - Sweep   On involved side, make 5 circles at groin at panty line, then pump _5__ times from armpit along side of trunk to outer hip, making your other pathway. Do __1_ time per day.  Copyright  VHI. All rights reserved.  Arm Posterior: Elbow to Shoulder -  Sweep   Pump _5__ times from back of elbow to top of shoulder. Then inner to outer upper arm _5_ times, then outer arm again _5_ times. Then back to the pathways _2-3_ times. Do _1__ time per day.  Copyright  VHI. All rights reserved.  ARM: Volar Wrist to Elbow - Sweep   Pump or stationary circles _5__ times from wrist to elbow making sure to do both sides of the forearm. Then retrace your steps to the outer arm, and the pathways _2-3_ times each. Do _1__ time per day.  Copyright  VHI. All rights reserved.  ARM: Dorsum of Hand to Shoulder - Sweep   Pump or stationary circles _5__ times on back of hand including knuckle spaces and individual fingers if needed working up towards the wrist, then retrace all your steps working back up the forearm, doing both sides; upper outer arm and back to your pathways _2-3_ times each. Then do 5 circles again at uninvolved armpit and involved groin where you started! Good job!! Do __1_ time per day.

## 2022-06-07 NOTE — Patient Instructions (Addendum)
  Start with circles near neck above collarbones, 10 times each side     Cancer Rehab [phone] Deep Effective Breath   Standing, sitting, or laying down, place both hands on the belly. Take a deep breath IN, expanding the belly; then breath OUT, contracting the belly. Repeat __5__ times. Do __2-3__ sessions per day and before your self massage.  http://gt2.exer.us/866   Copyright  VHI. All rights reserved.  Axilla to Axilla - Sweep   On uninvolved side make 5 circles in the armpit, then pump _5__ times from involved armpit across chest to uninvolved armpit, making a pathway. Do _1__ time per day.  Copyright  VHI. All rights reserved.  Axilla to Inguinal Nodes - Sweep   On involved side, make 5 circles at groin at panty line, then pump _5__ times from armpit along side of trunk to outer hip, making your other pathway. Do __1_ time per day.  Copyright  VHI. All rights reserved.  Arm Posterior: Elbow to Shoulder - Sweep   Pump _5__ times from back of elbow to top of shoulder. Then inner to outer upper arm _5_ times, then outer arm again _5_ times. Then back to the pathways _2-3_ times. Do _1__ time per day.  Copyright  VHI. All rights reserved.  ARM: Volar Wrist to Elbow - Sweep   Pump or stationary circles _5__ times from wrist to elbow making sure to do both sides of the forearm. Then retrace your steps to the outer arm, and the pathways _2-3_ times each. Do _1__ time per day.  Copyright  VHI. All rights reserved.  ARM: Dorsum of Hand to Shoulder - Sweep   Pump or stationary circles _5__ times on back of hand including knuckle spaces and individual fingers if needed working up towards the wrist, then retrace all your steps working back up the forearm, doing both sides; upper outer arm and back to your pathways _2-3_ times each. Then do 5 circles again at uninvolved armpit and involved groin where you started! Good job!! Do __1_ time per day.  Copyright  VHI. All  rights reserved.

## 2022-06-11 ENCOUNTER — Ambulatory Visit
Admission: RE | Admit: 2022-06-11 | Discharge: 2022-06-11 | Disposition: A | Discharge status: Home / Self Care | Payer: BC Managed Care – PPO | Source: Ambulatory Visit | Attending: Radiation Oncology | Admitting: Radiation Oncology

## 2022-06-11 ENCOUNTER — Telehealth: Payer: Self-pay | Admitting: *Deleted

## 2022-06-11 ENCOUNTER — Other Ambulatory Visit: Payer: Self-pay

## 2022-06-11 DIAGNOSIS — C50411 Malignant neoplasm of upper-outer quadrant of right female breast: Secondary | ICD-10-CM | POA: Diagnosis present

## 2022-06-11 DIAGNOSIS — G620 Drug-induced polyneuropathy: Secondary | ICD-10-CM | POA: Diagnosis not present

## 2022-06-11 DIAGNOSIS — I10 Essential (primary) hypertension: Secondary | ICD-10-CM | POA: Insufficient documentation

## 2022-06-11 DIAGNOSIS — Z510 Encounter for antineoplastic radiation therapy: Secondary | ICD-10-CM | POA: Insufficient documentation

## 2022-06-11 DIAGNOSIS — Z171 Estrogen receptor negative status [ER-]: Secondary | ICD-10-CM | POA: Insufficient documentation

## 2022-06-11 LAB — RAD ONC ARIA SESSION SUMMARY
Course Elapsed Days: 0
Plan Fractions Treated to Date: 1
Plan Prescribed Dose Per Fraction: 2.66 Gy
Plan Total Fractions Prescribed: 16
Plan Total Prescribed Dose: 42.56 Gy
Reference Point Dosage Given to Date: 2.66 Gy
Reference Point Session Dosage Given: 2.66 Gy
Session Number: 1

## 2022-06-11 NOTE — Telephone Encounter (Signed)
This RN received VM from pt's dental office stating pt was present in their office for appointment with request for this office to return call for clearance to proceed.  Return call number given as [phone] and obtained VM for Dr Alric Ran dental office.  This RN left a detailed VM per above stating patient is not released for routine dental care including cleanings at this time per need for follow up with lab- post completing treatment recently. Pt is also scheduled for radiation.  This RN stated routine care should be pushed out for 6 months.  If pt is having an acute dental issue - needs assessment and to call this RN back- direct desk number given for communication.

## 2022-06-11 NOTE — Progress Notes (Signed)
HPI: FU palpitations.  Echocardiogram July 2023 showed normal LV function.  Since last seen, there is no dyspnea, chest pain, syncope.  Occasional brief flutters but no sustained palpitations.  She is undergoing radiation for her breast cancer.  Current Outpatient Medications  Medication Sig Dispense Refill   cholecalciferol (VITAMIN D3) 25 MCG (1000 UNIT) tablet Take 1,000 Units by mouth daily.     lidocaine-prilocaine (EMLA) cream Apply to affected area once 30 g 3   Misc Natural Products (TART CHERRY ADVANCED PO) Take 1 tablet by mouth daily.     Multiple Vitamins-Minerals (MULTIVIT/MULTIMINERAL ADULT) LIQD Take 30 mLs by mouth daily.     No current facility-administered medications for this visit.     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[place])    09/26/2021 (right breast biopsy)   Mild mitral regurgitation 06/2019   Palpitations    PONV (postoperative nausea and vomiting)     Past Surgical History:  Procedure Laterality Date   BREAST BIOPSY  05/03/2022   MM RT RADIOACTIVE SEED LOC MAMMO GUIDE 05/03/2022 [facility] MAMMOGRAPHY   BREAST LUMPECTOMY WITH RADIOACTIVE SEED AND SENTINEL LYMPH NODE BIOPSY Right 05/04/2022   Procedure: RIGHT BREAST LUMPECTOMY WITH RADIOACTIVE SEED AND SENTINEL LYMPH NODE BIOPSY;  Surgeon: Jovita Kussmaul, MD;  Location: [place];  Service: General;  Laterality: Right;   CHOLECYSTECTOMY     CYST REMOVAL TRUNK Right 05/04/2022   Procedure: CYST REMOVAL AXILLA;  Surgeon: Jovita Kussmaul, MD;  Location: [place];  Service: General;  Laterality: Right;   GALLBLADDER SURGERY     PORTACATH PLACEMENT Left 10/18/2021   Procedure: PLA CEMENT OF PORT;  Surgeon: Jovita Kussmaul, MD;  [facility];  Service: General;  Laterality: Left;    Social History   Socioeconomic History   Marital status:  Married    Spouse name: Not on file   Number of children: 2   Years of education: Not on file   Highest education level: Not on file  Occupational History    Comment: Hair dresser  Tobacco Use   Smoking status: Never   Smokeless tobacco: Never  Vaping Use   Vaping Use: Never used  Substance and Sexual Activity   Alcohol use: Yes    Comment: socially   Drug use: No   Sexual activity: Yes    Partners: Male  Other Topics Concern   Not on file  Social History Narrative   Pt is a hair stylist   Married (3rd marriage)   2 sons (both grown)   Enjoys yard work, hiking, Scientist, clinical (histocompatibility and immunogenetics)   Complete 2 year cosmetology   Has one Neurosurgeon   Social Determinants of Radio broadcast assistant Strain: Not on Art therapist Insecurity: Not on file  Transportation Needs: Not on file  Physical Activity: Not on file  Stress: Not on file  Social Connections: Not on file  Intimate Partner Violence: Not on file    Family History  Problem Relation Age of Onset   Hypertension Mother    Heart attack Mother    Heart disease Mother        Atrial fibrillation   Obstructive Sleep Apnea Mother    Hypertension Brother    Stroke Maternal Grandmother 35   Breast cancer Other 35  PGM's sister   Colon cancer Neg Hx    Esophageal cancer Neg Hx     ROS: no fevers or chills, productive cough, hemoptysis, dysphasia, odynophagia, melena, hematochezia, dysuria, hematuria, rash, seizure activity, orthopnea, PND, pedal edema, claudication. Remaining systems are negative.  Physical Exam: Well-developed well-nourished in no acute distress.  Skin is warm and dry.  HEENT is normal.  Neck is supple.  Chest is clear to auscultation with normal expansion.  Cardiovascular exam is regular rate and rhythm.  Abdominal exam nontender or distended. No masses palpated. Extremities show no edema. neuro grossly intact   A/P  1 palpitations-previous echocardiogram showed normal LV function.  Symptoms are reasonly  well-controlled.  Will follow for now.  Can consider addition of beta-blockade in the future if necessary.  2 history of hypertension-patient's blood pressure is controlled.   3 history of mitral regurgitation-trace on most recent echocardiogram.  4 breast cancer-Per oncology.  Kirk Ruths, MD

## 2022-06-12 ENCOUNTER — Other Ambulatory Visit: Payer: Self-pay

## 2022-06-12 ENCOUNTER — Ambulatory Visit: Payer: BC Managed Care – PPO | Attending: General Surgery

## 2022-06-12 ENCOUNTER — Ambulatory Visit
Admission: RE | Admit: 2022-06-12 | Discharge: 2022-06-12 | Disposition: A | Discharge status: Home / Self Care | Payer: BC Managed Care – PPO | Source: Ambulatory Visit | Attending: Radiation Oncology | Admitting: Radiation Oncology

## 2022-06-12 DIAGNOSIS — Z483 Aftercare following surgery for neoplasm: Secondary | ICD-10-CM | POA: Diagnosis present

## 2022-06-12 DIAGNOSIS — Z9189 Other specified personal risk factors, not elsewhere classified: Secondary | ICD-10-CM | POA: Insufficient documentation

## 2022-06-12 DIAGNOSIS — Z170 Estrogen receptor positive status [ER+]: Secondary | ICD-10-CM | POA: Insufficient documentation

## 2022-06-12 DIAGNOSIS — R293 Abnormal posture: Secondary | ICD-10-CM | POA: Insufficient documentation

## 2022-06-12 DIAGNOSIS — C50411 Malignant neoplasm of upper-outer quadrant of right female breast: Secondary | ICD-10-CM | POA: Insufficient documentation

## 2022-06-12 LAB — RAD ONC ARIA SESSION SUMMARY
Course Elapsed Days: 1
Plan Fractions Treated to Date: 2
Plan Prescribed Dose Per Fraction: 2.66 Gy
Plan Total Fractions Prescribed: 16
Plan Total Prescribed Dose: 42.56 Gy
Reference Point Dosage Given to Date: 5.32 Gy
Reference Point Session Dosage Given: 2.66 Gy
Session Number: 2

## 2022-06-12 NOTE — Therapy (Signed)
OUTPATIENT PHYSICAL THERAPY BREAST CANCER TREATMENT   Patient Name: Wendy Webb MRN: [id number] DOB:1965/05/25, 57 y.o., female Today's Date: 06/12/2022  END OF SESSION:  PT End of Session - 06/12/22 0807     Visit Number 4    Number of Visits 9    Date for PT Re-Evaluation 07/02/22    PT Start Time 0803    PT Stop Time 0902    PT Time Calculation (min) 59 min    Activity Tolerance Patient tolerated treatment well    Behavior During Therapy [facility] for tasks assessed/performed             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[place])    09/26/2021 (right breast biopsy)   Mild mitral regurgitation 06/2019   Palpitations    PONV (postoperative nausea and vomiting)    Past Surgical History:  Procedure Laterality Date   BREAST BIOPSY  05/03/2022   MM RT RADIOACTIVE SEED LOC MAMMO GUIDE 05/03/2022 [facility] MAMMOGRAPHY   BREAST LUMPECTOMY WITH RADIOACTIVE SEED AND SENTINEL LYMPH NODE BIOPSY Right 05/04/2022   Procedure: RIGHT BREAST LUMPECTOMY WITH RADIOACTIVE SEED AND SENTINEL LYMPH NODE BIOPSY;  Surgeon: Jovita Kussmaul, MD;  Location: [place];  Service: General;  Laterality: Right;   CHOLECYSTECTOMY     CYST REMOVAL TRUNK Right 05/04/2022   Procedure: CYST REMOVAL AXILLA;  Surgeon: Jovita Kussmaul, MD;  Location: [place];  Service: General;  Laterality: Right;   GALLBLADDER SURGERY     PORTACATH PLACEMENT Left 10/18/2021   Procedure: PLA CEMENT OF PORT;  Surgeon: Jovita Kussmaul, MD;  Location: [place];  Service: General;  Laterality: Left;   Patient Active Problem List   Diagnosis Date Noted   Chemotherapy-induced neuropathy ([place]) 02/23/2022   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 Autumn Messing  REFERRING DIAG: Rt breast cancer  THERAPY DIAG:  Abnormal posture  Aftercare following surgery for neoplasm  Malignant neoplasm of upper-outer quadrant of right breast in female, estrogen receptor positive ([place])  At risk for lymphedema  Rationale for Evaluation and Treatment: Rehabilitation  ONSET DATE: 09/25/21  SUBJECTIVE:  SUBJECTIVE STATEMENT: My first radiation was yesterday and I could get into position just fine and I really struggled with that during the simulation. I've noticed the back of my hand is more swollen since I've started wearing the gauntlet.    PERTINENT HISTORY:  Patient was diagnosed on 09/25/2021 with right grade 3 invasive ductal carcinoma breast cancer. It measures 2.5 cm and is located in the upper outer quadrant. It is triple negative with a Ki67 of 95%. Completed chemotherapy AC, carboplatin, Paclitaxel, and keytruda. Rt lumpectomy and SLNB on 05/04/22 with 2 negative nodes removed.    PATIENT GOALS:  Reassess how my recovery is going related to arm function, pain, and swelling.  PAIN:  Are you having pain? No, the stretches have been helping   PRECAUTIONS: Recent Surgery, right UE Lymphedema risk  ACTIVITY LEVEL / LEISURE: back to normal   OBJECTIVE:   PATIENT SURVEYS:  QUICK DASH: 4.55% from 4.55%  OBSERVATIONS: Well healed incision, no axillary edema noted,  cording noted from axilla to ulnar side of the wrist . 3 good sized cords in the antecubital fossa.   POSTURE:  Rounded shoulders    LYMPHEDEMA ASSESSMENT:   UPPER EXTREMITY AROM/PROM:   A/PROM RIGHT   eval   06/04/22  Shoulder extension 42 45  Shoulder flexion 154 130 pull   Shoulder abduction 163 105 pull from cording   Shoulder internal rotation 64   Shoulder external rotation 80 90                          (Blank rows = not tested)   A/PROM LEFT   eval  Shoulder extension 47  Shoulder flexion 142  Shoulder abduction 161  Shoulder internal rotation 60  Shoulder external rotation 90                          (Blank rows = not tested)     CERVICAL AROM: All within normal limits   UPPER EXTREMITY STRENGTH: WFL    LYMPHEDEMA ASSESSMENTS:    LANDMARK RIGHT   eval 06/04/22    31.2  10 cm proximal to olecranon process 25.5 28.4  Olecranon process 23 24.6    23.3  10 cm proximal to ulnar styloid process 19.8 20.5  Just proximal to ulnar styloid process 14 14.6  Across hand at thumb web space 17.5 18.5  At base of 2nd digit 5.9 5.9  (Blank rows = not tested) Length 6.5cm   LANDMARK LEFT   eval 06/04/22  15cm  31.7  10 cm proximal to olecranon process 26.7 29  Olecranon process 23.5 25  15cm  22  10 cm proximal to ulnar styloid process 19.3 19.1  Just proximal to ulnar styloid process 13.3 14.6  Across hand at thumb web space 17.5 18.5  At base of 2nd digit 5.8 6.1  (Blank rows = not tested)  TODAY'S TREATMENT Pt permission and consent throughout each step of examination and treatment with modification and draping if requested when working on sensitive areas 06/12/22; Manual Therapy MLD done as follows: Short neck, 5 diaphragmatic breaths (pt with improved technique with this today), Lt axillary and Rt inguinal nodes, anterior inter-axillary and Rt axillo-inguinal anastomosis, Rt UE working from proximal to distal, then retracing all steps reviewing correct skin stretch and pressure as pt reports she was pressing too hard.  MFR done at antecubital fossa where pt still with very tight, tender  and  limiting cording, also to axilla which limits her end shoulder range Assessed pts gauntlet and this does seem a bit loose today, though it wasn't last week when assessed for fit. So issued white foam for pt to wear at dorsal hand and she reports could immediately notice improvement as compression felt improved. Also issued a piece of 1/2" gray foam for pt to try intermittently. Also gave her the info to call [facility] to be measured for a glove if her hand swelling persists.   06/07/22: Self Care SOZO done today earlier than her 3 months since surgery as pt has visible dorsal hand swelling. Her change from baseline indicates subclinical lymphedema but is still in the yellow. She was measured for a compression sleeve and gauntlet (black size III each) and was instructed in proper wear and care of this (handout issued, see below).  Also spent time educating pt in basics of the anatomy of the lymphatic system and answered her questions regarding this. Also spent time educating her in some lymph reduction factors like limiting her time when gardening (she does this for hours at times) as this can be overloading her already overtaxed lymph system at this time. So pt is going to work on decreasing her time and will be wearing her compression garments now for 1 month until next SOZO.  Manual Therapy MLD: Instructed pt in MLD while performing today since she has visible hand and breast swelling (pt asked therapist to assess this as she has noticed firmness at inferior medial aspect of breast as of late and she also has some mild peau d'orange present as well). MLD done as follows: Short neck, 5 diaphragmatic breaths (instruction required for correct technique), Lt axillary and Rt inguinal nodes, anterior inter-axillary and Rt axillo-inguinal anastomosis, then Rt medial breast and Rt UE working from proximal to distal, then retracing all steps having pt return some demo as time allowed. Handout was issued for  this as well.  MFR done very briefly today as instructing in MLD took most of manual therapy time: this was done at antecubital fossa where pt still with very tight, tender and limiting cording   06/04/22 PROM to the right shoulder with edu on cording throughout and pinning at the elbow STM using opposing force pressure and along the cords with cocoa butter - gentle pressure overall for cording release.  Performed HEP per below with performance of each x 2  PATIENT EDUCATION:  Education details: Self MLD, wera and care of newly issued compression garments Person educated: Patient Education method: Explanation, Demonstration, Verbal cues, and Handouts Education comprehension: verbalized understanding, returned demonstration, and needs further education  HOME EXERCISE PROGRAM: Access Code: [email or web address] URL: [email or web address] Date: 06/04/2022 Prepared by: Shan Levans  Exercises - Supine Shoulder Flexion Extension AAROM with Dowel  - 1-2 x daily - 7 x weekly - 10 reps - 5 seconds hold - Supine Chest Stretch with Elbows Bent  - 1 x daily - 7 x weekly - 1 sets - 3 reps - 30-60seconds hold - Standing Shoulder Flexion Wall Walk  - 1 x daily - 7 x weekly - 1-3 sets - 10 reps - 20-30 seconds hold - Standing Pec Stretch at Wall  - 1 x daily - 7 x weekly - 1-3 sets - 10 reps - 20-30 seconds hold   ASSESSMENT:  CLINICAL IMPRESSION: Continued with manual therapy today and reviewed her pressure and skin stretch while performing MLD. Also assessed her dorsal hand and  she does have dorsal hand swelling and gauntlet felt somewhat loose today though did not feel this way last week (pt was between sizes). Placed white foam at dorsal hand under gauntlet and pt reports feeling immediate improvement of amount of compression so pt will try this to see if her hand swelling reduces.   Pt will benefit from skilled therapeutic intervention to improve on the following deficits: Decreased knowledge of  precautions, impaired UE functional use, pain, decreased ROM, postural dysfunction.   PT treatment/interventions: ADL/Self care home management, Therapeutic exercises, Patient/Family education, Self Care, Manual therapy, and Re-evaluation   GOALS: Goals reviewed with patient? Yes  LONG TERM GOALS:  (STG=LTG)  GOALS Name Target Date  Goal status  1 Pt will demonstrate she has regained full shoulder ROM and function post operatively compared to baselines.  Baseline: 07/01/32 INITIAL  2 Pt will be ind with final HEP 06/04/22 INITIAL  3 Pt will be scheduled out for SOZO surveillance and ABC class for lymphedema risk reduction 06/04/22 Initial          PLAN:  PT FREQUENCY/DURATION: 2x per week x 4 weeks and SOZO  PLAN FOR NEXT SESSION: How was hand foam? How was dorsal swelling? Issue foam for medial aspect of breast where mild lymphedema also present, how is wear of compression garments going ? Cont MFR to Rt UE cording release/PROM/stretches; Retest SOZO around 07/04/22    [facility]  [address], [address]  [place] [address]  [phone]    Otelia Limes, PTA 06/12/2022, 10:18 AM  PLEASE KEEP YOUR COMPRESSION GARMENT ON DURING THE DAY TO GET THE BEST SWELLING REDUCTION. HERE ARE SOME ADDITIONAL TIPS: Do not sleep in your garment. If you have pain or notice swelling in your hand or at the top of your shoulder, call your therapist. This may be a sign that you need a different garment. 3.  Take good care of your garment so it lasts longer: Follow washing instructions on your garment label or box. Wash periodically using a mild detergent in warm water.  Do not use fabric softener or bleach.  Place garment in a mesh lingerie bag and use the gentle cycle of the washing machine or hand wash. Tumble dry low or lay flat to dry. TAKE CARE OF YOUR SKIN Apply a low pH moisturizing lotion to your skin daily Avoid scratching your skin Treat skin  irritations quickly     Start with circles near neck above collarbones, 10 times each side  Deep Effective Breath   Standing, sitting, or laying down, place both hands on the belly. Take a deep breath IN, expanding the belly; then breath OUT, contracting the belly. Repeat __5__ times. Do __2-3__ sessions per day and before your self massage.  [email or web address].exer.us/866   Copyright  VHI. All rights reserved.  Axilla to Axilla - Sweep   On uninvolved side make 5 circles in the armpit, then pump _5__ times from involved armpit across chest to uninvolved armpit, making a pathway. Do _1__ time per day.  Copyright  VHI. All rights reserved.  Axilla to Inguinal Nodes - Sweep   On involved side, make 5 circles at groin at panty line, then pump _5__ times from armpit along side of trunk to outer hip, making your other pathway. Do __1_ time per day.  Copyright  VHI. All rights reserved.  Arm Posterior: Elbow to Shoulder - Sweep   Pump _5__ times from back of elbow to top of shoulder.  Then inner to outer upper arm _5_ times, then outer arm again _5_ times. Then back to the pathways _2-3_ times. Do _1__ time per day.  Copyright  VHI. All rights reserved.  ARM: [place]olar Wrist to Elbow - Sweep   Pump or stationary circles _5__ times from wrist to elbow making sure to do both sides of the forearm. Then retrace your steps to the outer arm, and the pathways _2-3_ times each. Do _1__ time per day.  Copyright  VHI. All rights reserved.  ARM: Dorsum of Hand to Shoulder - Sweep   Pump or stationary circles _5__ times on back of hand including knuckle spaces and individual fingers if needed working up towards the wrist, then retrace all your steps working back up the forearm, doing both sides; upper outer arm and back to your pathways _2-3_ times each. Then do 5 circles again at uninvolved armpit and involved groin where you started! Good job!! Do __1_ time per day.

## 2022-06-13 ENCOUNTER — Other Ambulatory Visit: Payer: Self-pay

## 2022-06-13 ENCOUNTER — Ambulatory Visit
Admission: RE | Admit: 2022-06-13 | Discharge: 2022-06-13 | Disposition: A | Discharge status: Home / Self Care | Payer: BC Managed Care – PPO | Source: Ambulatory Visit | Attending: Radiation Oncology | Admitting: Radiation Oncology

## 2022-06-13 DIAGNOSIS — C50411 Malignant neoplasm of upper-outer quadrant of right female breast: Secondary | ICD-10-CM | POA: Diagnosis not present

## 2022-06-13 LAB — RAD ONC ARIA SESSION SUMMARY
Course Elapsed Days: 2
Plan Fractions Treated to Date: 3
Plan Prescribed Dose Per Fraction: 2.66 Gy
Plan Total Fractions Prescribed: 16
Plan Total Prescribed Dose: 42.56 Gy
Reference Point Dosage Given to Date: 7.98 Gy
Reference Point Session Dosage Given: 2.66 Gy
Session Number: 3

## 2022-06-14 ENCOUNTER — Other Ambulatory Visit: Payer: Self-pay

## 2022-06-14 ENCOUNTER — Ambulatory Visit
Admission: RE | Admit: 2022-06-14 | Discharge: 2022-06-14 | Disposition: A | Discharge status: Home / Self Care | Payer: BC Managed Care – PPO | Source: Ambulatory Visit | Attending: Radiation Oncology | Admitting: Radiation Oncology

## 2022-06-14 ENCOUNTER — Ambulatory Visit: Payer: BC Managed Care – PPO | Admitting: Rehabilitation

## 2022-06-14 DIAGNOSIS — Z483 Aftercare following surgery for neoplasm: Secondary | ICD-10-CM

## 2022-06-14 DIAGNOSIS — C50411 Malignant neoplasm of upper-outer quadrant of right female breast: Secondary | ICD-10-CM | POA: Diagnosis not present

## 2022-06-14 DIAGNOSIS — Z170 Estrogen receptor positive status [ER+]: Secondary | ICD-10-CM

## 2022-06-14 DIAGNOSIS — R293 Abnormal posture: Secondary | ICD-10-CM

## 2022-06-14 DIAGNOSIS — Z9189 Other specified personal risk factors, not elsewhere classified: Secondary | ICD-10-CM

## 2022-06-14 LAB — RAD ONC ARIA SESSION SUMMARY
Course Elapsed Days: 3
Plan Fractions Treated to Date: 4
Plan Prescribed Dose Per Fraction: 2.66 Gy
Plan Total Fractions Prescribed: 16
Plan Total Prescribed Dose: 42.56 Gy
Reference Point Dosage Given to Date: 10.64 Gy
Reference Point Session Dosage Given: 2.66 Gy
Session Number: 4

## 2022-06-14 NOTE — Therapy (Signed)
OUTPATIENT PHYSICAL THERAPY BREAST CANCER TREATMENT   Patient Name: Wendy Webb MRN: [id number] DOB:11-Apr-1965, 57 y.o., female Today's Date: 06/14/2022  END OF SESSION:  PT End of Session - 06/14/22 1501     Visit Number 5    Number of Visits 9    Date for PT Re-Evaluation 07/02/22    PT Start Time 1503    PT Stop Time 1556    PT Time Calculation (min) 53 min    Activity Tolerance Patient tolerated treatment well    Behavior During Therapy [facility] for tasks assessed/performed             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[place])    09/26/2021 (right breast biopsy)   Mild mitral regurgitation 06/2019   Palpitations    PONV (postoperative nausea and vomiting)    Past Surgical History:  Procedure Laterality Date   BREAST BIOPSY  05/03/2022   MM RT RADIOACTIVE SEED LOC MAMMO GUIDE 05/03/2022 [facility] MAMMOGRAPHY   BREAST LUMPECTOMY WITH RADIOACTIVE SEED AND SENTINEL LYMPH NODE BIOPSY Right 05/04/2022   Procedure: RIGHT BREAST LUMPECTOMY WITH RADIOACTIVE SEED AND SENTINEL LYMPH NODE BIOPSY;  Surgeon: Jovita Kussmaul, MD;  Location: [place];  Service: General;  Laterality: Right;   CHOLECYSTECTOMY     CYST REMOVAL TRUNK Right 05/04/2022   Procedure: CYST REMOVAL AXILLA;  Surgeon: Jovita Kussmaul, MD;  Location: [place];  Service: General;  Laterality: Right;   GALLBLADDER SURGERY     PORTACATH PLACEMENT Left 10/18/2021   Procedure: PLA CEMENT OF PORT;  Surgeon: Jovita Kussmaul, MD;  Location: [place];  Service: General;  Laterality: Left;   Patient Active Problem List   Diagnosis Date Noted   Chemotherapy-induced neuropathy ([place]) 02/23/2022   Bone pain due to G-CSF 02/23/2022   Genetic testing 10/23/2021   Port-A-Cath in place 10/20/2021   Family history of breast cancer 10/04/2021   Malignant neoplasm of  upper-outer quadrant of right breast in female, estrogen receptor negative ([place]) 09/28/2021   Hypertension    Mild mitral regurgitation 06/2019   Palpitations 07/26/2014   Mitral regurgitation 07/26/2014    PCP: Clemmie Krill PA-C  REFERRING PROVIDER: Dr. Autumn Messing  REFERRING DIAG: Rt breast cancer  THERAPY DIAG:  Abnormal posture  Aftercare following surgery for neoplasm  Malignant neoplasm of upper-outer quadrant of right breast in female, estrogen receptor positive ([place])  At risk for lymphedema  Rationale for Evaluation and Treatment: Rehabilitation  ONSET DATE: 09/25/21  SUBJECTIVE:  SUBJECTIVE STATEMENT: The hand feels swollen without a glove.  I like the new glove. Otherwise great.    PERTINENT HISTORY:  Patient was diagnosed on 09/25/2021 with right grade 3 invasive ductal carcinoma breast cancer. It measures 2.5 cm and is located in the upper outer quadrant. It is triple negative with a Ki67 of 95%. Completed chemotherapy AC, carboplatin, Paclitaxel, and keytruda. Rt lumpectomy and SLNB on 05/04/22 with 2 negative nodes removed.    PATIENT GOALS:  Reassess how my recovery is going related to arm function, pain, and swelling.  PAIN:  Are you having pain? No, the stretches have been helping   PRECAUTIONS: Recent Surgery, right UE Lymphedema risk  ACTIVITY LEVEL / LEISURE: back to normal   OBJECTIVE:   PATIENT SURVEYS:  QUICK DASH: 4.55% from 4.55%  OBSERVATIONS: Well healed incision, no axillary edema noted,  cording noted from axilla to ulnar side of the wrist . 3 good sized cords in the antecubital fossa.   POSTURE:  Rounded shoulders   LYMPHEDEMA ASSESSMENT:   UPPER EXTREMITY AROM/PROM:   A/PROM RIGHT   eval   06/04/22 06/14/22  Shoulder extension 42 45   Shoulder  flexion 154 130 pull  140 - pull in the elbow  Shoulder abduction 163 105 pull from cording  150 - pull in the elbow   Shoulder internal rotation 64    Shoulder external rotation 80 90                           (Blank rows = not tested)   A/PROM LEFT   eval  Shoulder extension 47  Shoulder flexion 142  Shoulder abduction 161  Shoulder internal rotation 60  Shoulder external rotation 90                          (Blank rows = not tested)     CERVICAL AROM: All within normal limits   UPPER EXTREMITY STRENGTH: WFL    LYMPHEDEMA ASSESSMENTS:    LANDMARK RIGHT   eval 06/04/22    31.2  10 cm proximal to olecranon process 25.5 28.4  Olecranon process 23 24.6    23.3  10 cm proximal to ulnar styloid process 19.8 20.5  Just proximal to ulnar styloid process 14 14.6  Across hand at thumb web space 17.5 18.5  At base of 2nd digit 5.9 5.9  (Blank rows = not tested) Length 6.5cm   LANDMARK LEFT   eval 06/04/22  15cm  31.7  10 cm proximal to olecranon process 26.7 29  Olecranon process 23.5 25  15cm  22  10 cm proximal to ulnar styloid process 19.3 19.1  Just proximal to ulnar styloid process 13.3 14.6  Across hand at thumb web space 17.5 18.5  At base of 2nd digit 5.8 6.1  (Blank rows = not tested)  TODAY'S TREATMENT Pt permission and consent throughout each step of examination and treatment with modification and draping if requested when working on sensitive areas 06/14/22; Checked AROM Pulleys into flexion x 10 and abduction x 10 Ball flexion and abduction x 5 each MFR done at antecubital fossa where pt still with very tight, also to axilla which limits her end shoulder range PROM with pinning into flexion and abduction  06/12/22; Manual Therapy MLD done as follows: Short neck, 5 diaphragmatic breaths (pt with improved technique with this today), Lt axillary and Rt inguinal nodes, anterior inter-axillary and  Rt axillo-inguinal anastomosis, Rt UE working from proximal to  distal, then retracing all steps reviewing correct skin stretch and pressure as pt reports she was pressing too hard.  MFR done at antecubital fossa where pt still with very tight, tender and limiting cording, also to axilla which limits her end shoulder range Assessed pts gauntlet and this does seem a bit loose today, though it wasn't last week when assessed for fit. So issued white foam for pt to wear at dorsal hand and she reports could immediately notice improvement as compression felt improved. Also issued a piece of 1/2" gray foam for pt to try intermittently. Also gave her the info to call [facility] to be measured for a glove if her hand swelling persists.   06/07/22: Self Care SOZO done today earlier than her 3 months since surgery as pt has visible dorsal hand swelling. Her change from baseline indicates subclinical lymphedema but is still in the yellow. She was measured for a compression sleeve and gauntlet (black size III each) and was instructed in proper wear and care of this (handout issued, see below).  Also spent time educating pt in basics of the anatomy of the lymphatic system and answered her questions regarding this. Also spent time educating her in some lymph reduction factors like limiting her time when gardening (she does this for hours at times) as this can be overloading her already overtaxed lymph system at this time. So pt is going to work on decreasing her time and will be wearing her compression garments now for 1 month until next SOZO.  Manual Therapy MLD: Instructed pt in MLD while performing today since she has visible hand and breast swelling (pt asked therapist to assess this as she has noticed firmness at inferior medial aspect of breast as of late and she also has some mild peau d'orange present as well). MLD done as follows: Short neck, 5 diaphragmatic breaths (instruction required for correct technique), Lt axillary and Rt inguinal nodes, anterior inter-axillary  and Rt axillo-inguinal anastomosis, then Rt medial breast and Rt UE working from proximal to distal, then retracing all steps having pt return some demo as time allowed. Handout was issued for this as well.  MFR done very briefly today as instructing in MLD took most of manual therapy time: this was done at antecubital fossa where pt still with very tight, tender and limiting cording   PATIENT EDUCATION:  Education details: Self MLD, wera and care of newly issued compression garments Person educated: Patient Education method: Explanation, Demonstration, Verbal cues, and Handouts Education comprehension: verbalized understanding, returned demonstration, and needs further education  HOME EXERCISE PROGRAM: Access Code: [email or web address] URL: [email or web address] Date: 06/04/2022 Prepared by: Shan Levans  Exercises - Supine Shoulder Flexion Extension AAROM with Dowel  - 1-2 x daily - 7 x weekly - 10 reps - 5 seconds hold - Supine Chest Stretch with Elbows Bent  - 1 x daily - 7 x weekly - 1 sets - 3 reps - 30-60seconds hold - Standing Shoulder Flexion Wall Walk  - 1 x daily - 7 x weekly - 1-3 sets - 10 reps - 20-30 seconds hold - Standing Pec Stretch at Wall  - 1 x daily - 7 x weekly - 1-3 sets - 10 reps - 20-30 seconds hold   ASSESSMENT:  CLINICAL IMPRESSION: Continued with POC.  Pt still has very significant cording in the elbow but is improving her ROM.  Still not quite to baseline.  Pt will benefit from skilled therapeutic intervention to improve on the following deficits: Decreased knowledge of precautions, impaired UE functional use, pain, decreased ROM, postural dysfunction.   PT treatment/interventions: ADL/Self care home management, Therapeutic exercises, Patient/Family education, Self Care, Manual therapy, and Re-evaluation   GOALS: Goals reviewed with patient? Yes  LONG TERM GOALS:  (STG=LTG)  GOALS Name Target Date  Goal status  1 Pt will demonstrate she has  regained full shoulder ROM and function post operatively compared to baselines.  Baseline: 07/01/32 INITIAL  2 Pt will be ind with final HEP 06/04/22 INITIAL  3 Pt will be scheduled out for SOZO surveillance and ABC class for lymphedema risk reduction 06/04/22 Initial          PLAN:  PT FREQUENCY/DURATION: 2x per week x 4 weeks and SOZO  PLAN FOR NEXT SESSION: Cont MFR to Rt UE cording release/PROM/stretches; MLD to the UE due to high SOZO score. Retest SOZO around 07/04/22    [facility]  [address], [address]  [place] [address]  [phone]    Stark Bray, PT 06/14/2022, 3:56 PM  PLEASE KEEP YOUR COMPRESSION GARMENT ON DURING THE DAY TO GET THE BEST SWELLING REDUCTION. HERE ARE SOME ADDITIONAL TIPS: Do not sleep in your garment. If you have pain or notice swelling in your hand or at the top of your shoulder, call your therapist. This may be a sign that you need a different garment. 3.  Take good care of your garment so it lasts longer: Follow washing instructions on your garment label or box. Wash periodically using a mild detergent in warm water.  Do not use fabric softener or bleach.  Place garment in a mesh lingerie bag and use the gentle cycle of the washing machine or hand wash. Tumble dry low or lay flat to dry. TAKE CARE OF YOUR SKIN Apply a low pH moisturizing lotion to your skin daily Avoid scratching your skin Treat skin irritations quickly     Start with circles near neck above collarbones, 10 times each side  Deep Effective Breath   Standing, sitting, or laying down, place both hands on the belly. Take a deep breath IN, expanding the belly; then breath OUT, contracting the belly. Repeat __5__ times. Do __2-3__ sessions per day and before your self massage.  http://[email or web address].us/866   Copyright  VHI. All rights reserved.  Axilla to Axilla - Sweep   On uninvolved side make 5 circles in the armpit, then pump _5__ times from  involved armpit across chest to uninvolved armpit, making a pathway. Do _1__ time per day.  Copyright  VHI. All rights reserved.  Axilla to Inguinal Nodes - Sweep   On involved side, make 5 circles at groin at panty line, then pump _5__ times from armpit along side of trunk to outer hip, making your other pathway. Do __1_ time per day.  Copyright  VHI. All rights reserved.  Arm Posterior: Elbow to Shoulder - Sweep   Pump _5__ times from back of elbow to top of shoulder. Then inner to outer upper arm _5_ times, then outer arm again _5_ times. Then back to the pathways _2-3_ times. Do _1__ time per day.  Copyright  VHI. All rights reserved.  ARM: Volar Wrist to Elbow - Sweep   Pump or stationary circles _5__ times from wrist to elbow making sure to do both sides of the forearm. Then retrace your steps to the outer arm, and the pathways _2-3_ times  each. Do _1__ time per day.  Copyright  VHI. All rights reserved.  ARM: Dorsum of Hand to Shoulder - Sweep   Pump or stationary circles _5__ times on back of hand including knuckle spaces and individual fingers if needed working up towards the wrist, then retrace all your steps working back up the forearm, doing both sides; upper outer arm and back to your pathways _2-3_ times each. Then do 5 circles again at uninvolved armpit and involved groin where you started! Good job!! Do __1_ time per day.

## 2022-06-15 ENCOUNTER — Ambulatory Visit
Admission: RE | Admit: 2022-06-15 | Discharge: 2022-06-15 | Disposition: A | Discharge status: Home / Self Care | Payer: BC Managed Care – PPO | Source: Ambulatory Visit | Attending: Radiation Oncology | Admitting: Radiation Oncology

## 2022-06-15 ENCOUNTER — Other Ambulatory Visit: Payer: Self-pay

## 2022-06-15 DIAGNOSIS — Z171 Estrogen receptor negative status [ER-]: Secondary | ICD-10-CM

## 2022-06-15 DIAGNOSIS — C50411 Malignant neoplasm of upper-outer quadrant of right female breast: Secondary | ICD-10-CM | POA: Diagnosis not present

## 2022-06-15 LAB — RAD ONC ARIA SESSION SUMMARY
Course Elapsed Days: 4
Plan Fractions Treated to Date: 5
Plan Prescribed Dose Per Fraction: 2.66 Gy
Plan Total Fractions Prescribed: 16
Plan Total Prescribed Dose: 42.56 Gy
Reference Point Dosage Given to Date: 13.3 Gy
Reference Point Session Dosage Given: 2.66 Gy
Session Number: 5

## 2022-06-15 MED ORDER — RADIAPLEXRX EX GEL: Freq: Once | CUTANEOUS | Status: AC

## 2022-06-18 ENCOUNTER — Encounter: Payer: Self-pay | Admitting: Cardiology

## 2022-06-18 ENCOUNTER — Ambulatory Visit: Payer: BC Managed Care – PPO

## 2022-06-18 ENCOUNTER — Ambulatory Visit: Payer: BC Managed Care – PPO | Attending: Cardiology | Admitting: Cardiology

## 2022-06-18 VITALS — BP 132/80 | HR 71 | Ht 67.0 in | Wt 157.6 lb

## 2022-06-18 DIAGNOSIS — R002 Palpitations: Secondary | ICD-10-CM | POA: Diagnosis not present

## 2022-06-18 DIAGNOSIS — I10 Essential (primary) hypertension: Secondary | ICD-10-CM

## 2022-06-18 NOTE — Patient Instructions (Signed)
  Follow-Up: At [facility], you and your health needs are our priority.  As part of our continuing mission to provide you with exceptional heart care, we have created designated Provider Care Teams.  These Care Teams include your primary Cardiologist (physician) and Advanced Practice Providers (APPs -  Physician Assistants and Nurse Practitioners) who all work together to provide you with the care you need, when you need it.  We recommend signing up for the patient portal called "MyChart".  Sign up information is provided on this After Visit Summary.  MyChart is used to connect with patients for Virtual Visits (Telemedicine).  Patients are able to view lab/test results, encounter notes, upcoming appointments, etc.  Non-urgent messages can be sent to your provider as well.   To learn more about what you can do with MyChart, go to [email or web address].    Your next appointment:   12 month(s)  Provider:   Brian Crenshaw MD    

## 2022-06-19 ENCOUNTER — Ambulatory Visit
Admission: RE | Admit: 2022-06-19 | Discharge: 2022-06-19 | Disposition: A | Discharge status: Home / Self Care | Payer: BC Managed Care – PPO | Source: Ambulatory Visit | Attending: Radiation Oncology | Admitting: Radiation Oncology

## 2022-06-19 ENCOUNTER — Ambulatory Visit: Payer: BC Managed Care – PPO

## 2022-06-19 ENCOUNTER — Other Ambulatory Visit: Payer: Self-pay

## 2022-06-19 DIAGNOSIS — C50411 Malignant neoplasm of upper-outer quadrant of right female breast: Secondary | ICD-10-CM

## 2022-06-19 DIAGNOSIS — R293 Abnormal posture: Secondary | ICD-10-CM | POA: Diagnosis not present

## 2022-06-19 DIAGNOSIS — Z483 Aftercare following surgery for neoplasm: Secondary | ICD-10-CM

## 2022-06-19 DIAGNOSIS — Z9189 Other specified personal risk factors, not elsewhere classified: Secondary | ICD-10-CM

## 2022-06-19 LAB — RAD ONC ARIA SESSION SUMMARY
Course Elapsed Days: 8
Plan Fractions Treated to Date: 6
Plan Prescribed Dose Per Fraction: 2.66 Gy
Plan Total Fractions Prescribed: 16
Plan Total Prescribed Dose: 42.56 Gy
Reference Point Dosage Given to Date: 15.96 Gy
Reference Point Session Dosage Given: 2.66 Gy
Session Number: 6

## 2022-06-19 NOTE — Therapy (Signed)
OUTPATIENT PHYSICAL THERAPY BREAST CANCER TREATMENT   Patient Name: Wendy Webb MRN: [id number] DOB:1965/12/26, 57 y.o., female Today's Date: 06/19/2022  END OF SESSION:  PT End of Session - 06/19/22 0907     Visit Number 6    Number of Visits 9    Date for PT Re-Evaluation 07/02/22    PT Start Time 0907    PT Stop Time [id number]    PT Time Calculation (min) 55 min    Activity Tolerance Patient tolerated treatment well    Behavior During Therapy [facility] for tasks assessed/performed             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[place])    09/26/2021 (right breast biopsy)   Mild mitral regurgitation 06/2019   Palpitations    PONV (postoperative nausea and vomiting)    Past Surgical History:  Procedure Laterality Date   BREAST BIOPSY  05/03/2022   MM RT RADIOACTIVE SEED LOC MAMMO GUIDE 05/03/2022 [facility] MAMMOGRAPHY   BREAST LUMPECTOMY WITH RADIOACTIVE SEED AND SENTINEL LYMPH NODE BIOPSY Right 05/04/2022   Procedure: RIGHT BREAST LUMPECTOMY WITH RADIOACTIVE SEED AND SENTINEL LYMPH NODE BIOPSY;  Surgeon: Jovita Kussmaul, MD;  Location: [place];  Service: General;  Laterality: Right;   CHOLECYSTECTOMY     CYST REMOVAL TRUNK Right 05/04/2022   Procedure: CYST REMOVAL AXILLA;  Surgeon: Jovita Kussmaul, MD;  Location: [place];  Service: General;  Laterality: Right;   GALLBLADDER SURGERY     PORTACATH PLACEMENT Left 10/18/2021   Procedure: PLA CEMENT OF PORT;  Surgeon: Jovita Kussmaul, MD;  Location: [place];  Service: General;  Laterality: Left;   Patient Active Problem List   Diagnosis Date Noted   Chemotherapy-induced neuropathy ([place]) 02/23/2022   Bone pain due to G-CSF 02/23/2022   Genetic testing 10/23/2021   Port-A-Cath in place 10/20/2021   Family history of breast cancer 10/04/2021   Malignant neoplasm of  upper-outer quadrant of right breast in female, estrogen receptor negative ([place]) 09/28/2021   Hypertension    Mild mitral regurgitation 06/2019   Palpitations 07/26/2014   Mitral regurgitation 07/26/2014    PCP: Clemmie Krill PA-C  REFERRING PROVIDER: Dr. Autumn Messing  REFERRING DIAG: Rt breast cancer  THERAPY DIAG:  Abnormal posture  Aftercare following surgery for neoplasm  Malignant neoplasm of upper-outer quadrant of right breast in female, estrogen receptor positive ([place])  At risk for lymphedema  Rationale for Evaluation and Treatment: Rehabilitation  ONSET DATE: 09/25/21  SUBJECTIVE:  SUBJECTIVE STATEMENT: I've been wearing the glove and I can tell it's keeping my hand swelling down.   PERTINENT HISTORY:  Patient was diagnosed on 09/25/2021 with right grade 3 invasive ductal carcinoma breast cancer. It measures 2.5 cm and is located in the upper outer quadrant. It is triple negative with a Ki67 of 95%. Completed chemotherapy AC, carboplatin, Paclitaxel, and keytruda. Rt lumpectomy and SLNB on 05/04/22 with 2 negative nodes removed.    PATIENT GOALS:  Reassess how my recovery is going related to arm function, pain, and swelling.  PAIN:  Are you having pain? No, the stretches have been helping   PRECAUTIONS: Recent Surgery, right UE Lymphedema risk  ACTIVITY LEVEL / LEISURE: back to normal   OBJECTIVE:   PATIENT SURVEYS:  QUICK DASH: 4.55% from 4.55%  OBSERVATIONS: Well healed incision, no axillary edema noted,  cording noted from axilla to ulnar side of the wrist . 3 good sized cords in the antecubital fossa.   POSTURE:  Rounded shoulders   LYMPHEDEMA ASSESSMENT:   UPPER EXTREMITY AROM/PROM:   A/PROM RIGHT   eval   06/04/22 06/14/22  Shoulder extension 42 45   Shoulder  flexion 154 130 pull  140 - pull in the elbow  Shoulder abduction 163 105 pull from cording  150 - pull in the elbow   Shoulder internal rotation 64    Shoulder external rotation 80 90                           (Blank rows = not tested)   A/PROM LEFT   eval  Shoulder extension 47  Shoulder flexion 142  Shoulder abduction 161  Shoulder internal rotation 60  Shoulder external rotation 90                          (Blank rows = not tested)     CERVICAL AROM: All within normal limits   UPPER EXTREMITY STRENGTH: WFL    LYMPHEDEMA ASSESSMENTS:    LANDMARK RIGHT   eval 06/04/22    31.2  10 cm proximal to olecranon process 25.5 28.4  Olecranon process 23 24.6    23.3  10 cm proximal to ulnar styloid process 19.8 20.5  Just proximal to ulnar styloid process 14 14.6  Across hand at thumb web space 17.5 18.5  At base of 2nd digit 5.9 5.9  (Blank rows = not tested) Length 6.5cm   LANDMARK LEFT   eval 06/04/22  15cm  31.7  10 cm proximal to olecranon process 26.7 29  Olecranon process 23.5 25  15cm  22  10 cm proximal to ulnar styloid process 19.3 19.1  Just proximal to ulnar styloid process 13.3 14.6  Across hand at thumb web space 17.5 18.5  At base of 2nd digit 5.8 6.1  (Blank rows = not tested)  TODAY'S TREATMENT Pt permission and consent throughout each step of examination and treatment with modification and draping if requested when working on sensitive areas 06/19/22: Therapeutic Exercises Pulleys into flexion x10 and then abd x2 mins with tactile and VC's to decrease Rt scapular compensation Ball roll up wall into flexion and then Rt UE abd x10 each with tactile and VC's to decrease Rt scapular compensation. Pt is able to do this with cuing but does still struggle.  Modified downward dog on wall 5x, 5 sec holds Manual Therapy MFR done at antecubital fossa where pt still with  very tight PROM with pinning into flexion, abduction and D2  06/14/22; Checked AROM Pulleys  into flexion x 10 and abduction x 10 Ball flexion and abduction x 5 each MFR done at antecubital fossa where pt still with very tight, also to axilla which limits her end shoulder range PROM with pinning into flexion and abduction  06/12/22; Manual Therapy MLD done as follows: Short neck, 5 diaphragmatic breaths (pt with improved technique with this today), Lt axillary and Rt inguinal nodes, anterior inter-axillary and Rt axillo-inguinal anastomosis, Rt UE working from proximal to distal, then retracing all steps reviewing correct skin stretch and pressure as pt reports she was pressing too hard.  MFR done at antecubital fossa where pt still with very tight, tender and limiting cording, also to axilla which limits her end shoulder range Assessed pts gauntlet and this does seem a bit loose today, though it wasn't last week when assessed for fit. So issued white foam for pt to wear at dorsal hand and she reports could immediately notice improvement as compression felt improved. Also issued a piece of 1/2" gray foam for pt to try intermittently. Also gave her the info to call [facility] to be measured for a glove if her hand swelling persists.     PATIENT EDUCATION:  Education details: Self MLD, wera and care of newly issued compression garments Person educated: Patient Education method: Explanation, Demonstration, Verbal cues, and Handouts Education comprehension: verbalized understanding, returned demonstration, and needs further education  HOME EXERCISE PROGRAM: Access Code: [email or web address] URL: [email or web address] Date: 06/04/2022 Prepared by: Shan Levans  Exercises - Supine Shoulder Flexion Extension AAROM with Dowel  - 1-2 x daily - 7 x weekly - 10 reps - 5 seconds hold - Supine Chest Stretch with Elbows Bent  - 1 x daily - 7 x weekly - 1 sets - 3 reps - 30-60seconds hold - Standing Shoulder Flexion Wall Walk  - 1 x daily - 7 x weekly - 1-3 sets - 10 reps - 20-30 seconds  hold - Standing Pec Stretch at Wall  - 1 x daily - 7 x weekly - 1-3 sets - 10 reps - 20-30 seconds hold   ASSESSMENT:  CLINICAL IMPRESSION: Pt is improving with scapular compensations as these present just mildly now with A/AA/ROM. Cording in antecubital fossa still present and limiting to end motions but is also improving.    Pt will benefit from skilled therapeutic intervention to improve on the following deficits: Decreased knowledge of precautions, impaired UE functional use, pain, decreased ROM, postural dysfunction.   PT treatment/interventions: ADL/Self care home management, Therapeutic exercises, Patient/Family education, Self Care, Manual therapy, and Re-evaluation   GOALS: Goals reviewed with patient? Yes  LONG TERM GOALS:  (STG=LTG)  GOALS Name Target Date  Goal status  1 Pt will demonstrate she has regained full shoulder ROM and function post operatively compared to baselines.  Baseline: 07/01/32 INITIAL  2 Pt will be ind with final HEP 06/04/22 INITIAL  3 Pt will be scheduled out for SOZO surveillance and ABC class for lymphedema risk reduction 06/04/22 Initial          PLAN:  PT FREQUENCY/DURATION: 2x per week x 4 weeks and SOZO  PLAN FOR NEXT SESSION: Cont MFR to Rt UE cording release/PROM/stretches; MLD to the UE due to high SOZO score. Retest SOZO around 07/04/22    [facility]  [address], [address]  [place]  [address]  [phone]    Otelia Limes,  PTA 06/19/2022, 10:04 AM  PLEASE KEEP YOUR COMPRESSION GARMENT ON DURING THE DAY TO GET THE BEST SWELLING REDUCTION. HERE ARE SOME ADDITIONAL TIPS: Do not sleep in your garment. If you have pain or notice swelling in your hand or at the top of your shoulder, call your therapist. This may be a sign that you need a different garment. 3.  Take good care of your garment so it lasts longer: Follow washing instructions on your garment label or box. Wash periodically using a  mild detergent in warm water.  Do not use fabric softener or bleach.  Place garment in a mesh lingerie bag and use the gentle cycle of the washing machine or hand wash. Tumble dry low or lay flat to dry. TAKE CARE OF YOUR SKIN Apply a low pH moisturizing lotion to your skin daily Avoid scratching your skin Treat skin irritations quickly     Start with circles near neck above collarbones, 10 times each side  Deep Effective Breath   Standing, sitting, or laying down, place both hands on the belly. Take a deep breath IN, expanding the belly; then breath OUT, contracting the belly. Repeat __5__ times. Do __2-3__ sessions per day and before your self massage.  http://gt2.exer.us/866   Copyright  VHI. All rights reserved.  Axilla to Axilla - Sweep   On uninvolved side make 5 circles in the armpit, then pump _5__ times from involved armpit across chest to uninvolved armpit, making a pathway. Do _1__ time per day.  Copyright  VHI. All rights reserved.  Axilla to Inguinal Nodes - Sweep   On involved side, make 5 circles at groin at panty line, then pump _5__ times from armpit along side of trunk to outer hip, making your other pathway. Do __1_ time per day.  Copyright  VHI. All rights reserved.  Arm Posterior: Elbow to Shoulder - Sweep   Pump _5__ times from back of elbow to top of shoulder. Then inner to outer upper arm _5_ times, then outer arm again _5_ times. Then back to the pathways _2-3_ times. Do _1__ time per day.  Copyright  VHI. All rights reserved.  ARM: [place] Wrist to Elbow - Sweep   Pump or stationary circles _5__ times from wrist to elbow making sure to do both sides of the forearm. Then retrace your steps to the outer arm, and the pathways _2-3_ times each. Do _1__ time per day.  Copyright  VHI. All rights reserved.  ARM: Dorsum of Hand to Shoulder - Sweep   Pump or stationary circles _5__ times on back of hand including knuckle spaces and individual  fingers if needed working up towards the wrist, then retrace all your steps working back up the forearm, doing both sides; upper outer arm and back to your pathways _2-3_ times each. Then do 5 circles again at uninvolved armpit and involved groin where you started! Good job!! Do __1_ time per day.

## 2022-06-20 ENCOUNTER — Inpatient Hospital Stay: Payer: BC Managed Care – PPO

## 2022-06-20 ENCOUNTER — Other Ambulatory Visit: Payer: Self-pay

## 2022-06-20 ENCOUNTER — Ambulatory Visit
Admission: RE | Admit: 2022-06-20 | Discharge: 2022-06-20 | Disposition: A | Discharge status: Home / Self Care | Payer: BC Managed Care – PPO | Source: Ambulatory Visit | Attending: Radiation Oncology | Admitting: Radiation Oncology

## 2022-06-20 DIAGNOSIS — C50411 Malignant neoplasm of upper-outer quadrant of right female breast: Secondary | ICD-10-CM | POA: Insufficient documentation

## 2022-06-20 DIAGNOSIS — Z95828 Presence of other vascular implants and grafts: Secondary | ICD-10-CM

## 2022-06-20 DIAGNOSIS — Z171 Estrogen receptor negative status [ER-]: Secondary | ICD-10-CM | POA: Insufficient documentation

## 2022-06-20 DIAGNOSIS — G620 Drug-induced polyneuropathy: Secondary | ICD-10-CM | POA: Insufficient documentation

## 2022-06-20 DIAGNOSIS — I10 Essential (primary) hypertension: Secondary | ICD-10-CM | POA: Insufficient documentation

## 2022-06-20 LAB — RAD ONC ARIA SESSION SUMMARY
Course Elapsed Days: 9
Plan Fractions Treated to Date: 7
Plan Prescribed Dose Per Fraction: 2.66 Gy
Plan Total Fractions Prescribed: 16
Plan Total Prescribed Dose: 42.56 Gy
Reference Point Dosage Given to Date: 18.62 Gy
Reference Point Session Dosage Given: 2.66 Gy
Session Number: 7

## 2022-06-20 MED ORDER — SODIUM CHLORIDE 0.9% FLUSH
10.0000 mL | Freq: Once | INTRAVENOUS | Status: AC
  Administered 2022-06-20: 10 mL

## 2022-06-20 MED ORDER — HEPARIN SOD (PORK) LOCK FLUSH 100 UNIT/ML IV SOLN
500.0000 [IU] | Freq: Once | INTRAVENOUS | Status: AC
  Administered 2022-06-20: 500 [IU]

## 2022-06-21 ENCOUNTER — Ambulatory Visit
Admission: RE | Admit: 2022-06-21 | Discharge: 2022-06-21 | Disposition: A | Discharge status: Home / Self Care | Payer: BC Managed Care – PPO | Source: Ambulatory Visit | Attending: Radiation Oncology | Admitting: Radiation Oncology

## 2022-06-21 ENCOUNTER — Other Ambulatory Visit: Payer: Self-pay

## 2022-06-21 DIAGNOSIS — C50411 Malignant neoplasm of upper-outer quadrant of right female breast: Secondary | ICD-10-CM | POA: Diagnosis not present

## 2022-06-21 LAB — RAD ONC ARIA SESSION SUMMARY
Course Elapsed Days: 10
Plan Fractions Treated to Date: 8
Plan Prescribed Dose Per Fraction: 2.66 Gy
Plan Total Fractions Prescribed: 16
Plan Total Prescribed Dose: 42.56 Gy
Reference Point Dosage Given to Date: 21.28 Gy
Reference Point Session Dosage Given: 2.66 Gy
Session Number: 8

## 2022-06-22 ENCOUNTER — Other Ambulatory Visit: Payer: Self-pay

## 2022-06-22 ENCOUNTER — Ambulatory Visit
Admission: RE | Admit: 2022-06-22 | Discharge: 2022-06-22 | Disposition: A | Discharge status: Home / Self Care | Payer: BC Managed Care – PPO | Source: Ambulatory Visit | Attending: Radiation Oncology | Admitting: Radiation Oncology

## 2022-06-22 ENCOUNTER — Ambulatory Visit: Payer: BC Managed Care – PPO

## 2022-06-22 DIAGNOSIS — R293 Abnormal posture: Secondary | ICD-10-CM | POA: Diagnosis not present

## 2022-06-22 DIAGNOSIS — Z170 Estrogen receptor positive status [ER+]: Secondary | ICD-10-CM

## 2022-06-22 DIAGNOSIS — C50411 Malignant neoplasm of upper-outer quadrant of right female breast: Secondary | ICD-10-CM | POA: Diagnosis not present

## 2022-06-22 DIAGNOSIS — Z483 Aftercare following surgery for neoplasm: Secondary | ICD-10-CM

## 2022-06-22 DIAGNOSIS — Z9189 Other specified personal risk factors, not elsewhere classified: Secondary | ICD-10-CM

## 2022-06-22 LAB — RAD ONC ARIA SESSION SUMMARY
Course Elapsed Days: 11
Plan Fractions Treated to Date: 9
Plan Prescribed Dose Per Fraction: 2.66 Gy
Plan Total Fractions Prescribed: 16
Plan Total Prescribed Dose: 42.56 Gy
Reference Point Dosage Given to Date: 23.94 Gy
Reference Point Session Dosage Given: 2.66 Gy
Session Number: 9

## 2022-06-22 NOTE — Patient Instructions (Signed)
Over Head Pull: Narrow and Wide Grip   Cancer Rehab [phone]   On back, knees bent, feet flat, band across thighs, elbows straight but relaxed. Pull hands apart (start). Keeping elbows straight, bring arms up and over head, hands toward floor. Keep pull steady on band. Hold momentarily. Return slowly, keeping pull steady, back to start. Then do same with a wider grip on the band (past shoulder width) Repeat _5-10__ times. Band color __yellow____   Side Pull: Double Arm   On back, knees bent, feet flat. Arms perpendicular to body, shoulder level, elbows straight but relaxed. Pull arms out to sides, elbows straight. Resistance band comes across collarbones, hands toward floor. Hold momentarily. Slowly return to starting position. Repeat _5-10__ times. Band color _yellow____   Sword   On back, knees bent, feet flat, left hand on left hip, right hand above left. Pull right arm DIAGONALLY (hip to shoulder) across chest. Bring right arm along head toward floor. Hold momentarily. Slowly return to starting position. Repeat _5-10__ times. Do with left arm. Band color _yellow_____   Shoulder Rotation: Double Arm   On back, knees bent, feet flat, elbows tucked at sides, bent 90, hands palms up. Pull hands apart and down toward floor, keeping elbows near sides. Hold momentarily. Slowly return to starting position. Repeat _5-10__ times. Band color __yellow____    

## 2022-06-22 NOTE — Therapy (Signed)
OUTPATIENT PHYSICAL THERAPY BREAST CANCER TREATMENT   Patient Name: Wendy Webb MRN: [id number] DOB:Aug 13, 1965, 57 y.o., female Today's Date: 06/22/2022  END OF SESSION:  PT End of Session - 06/22/22 0812     Visit Number 7    Number of Visits 9    Date for PT Re-Evaluation 07/02/22    PT Start Time 0804    PT Stop Time 0903    PT Time Calculation (min) 59 min    Activity Tolerance Patient tolerated treatment well    Behavior During Therapy [facility] for tasks assessed/performed             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[place])    09/26/2021 (right breast biopsy)   Mild mitral regurgitation 06/2019   Palpitations    PONV (postoperative nausea and vomiting)    Past Surgical History:  Procedure Laterality Date   BREAST BIOPSY  05/03/2022   MM RT RADIOACTIVE SEED LOC MAMMO GUIDE 05/03/2022 [facility] MAMMOGRAPHY   BREAST LUMPECTOMY WITH RADIOACTIVE SEED AND SENTINEL LYMPH NODE BIOPSY Right 05/04/2022   Procedure: RIGHT BREAST LUMPECTOMY WITH RADIOACTIVE SEED AND SENTINEL LYMPH NODE BIOPSY;  Surgeon: Jovita Kussmaul, MD;  Location: [place];  Service: General;  Laterality: Right;   CHOLECYSTECTOMY     CYST REMOVAL TRUNK Right 05/04/2022   Procedure: CYST REMOVAL AXILLA;  Surgeon: Jovita Kussmaul, MD;  Location: [place];  Service: General;  Laterality: Right;   GALLBLADDER SURGERY     PORTACATH PLACEMENT Left 10/18/2021   Procedure: PLA CEMENT OF PORT;  Surgeon: Jovita Kussmaul, MD;  Location: [place];  Service: General;  Laterality: Left;   Patient Active Problem List   Diagnosis Date Noted   Chemotherapy-induced neuropathy ([place]) 02/23/2022   Bone pain due to G-CSF 02/23/2022   Genetic testing 10/23/2021   Port-A-Cath in place 10/20/2021   Family history of breast cancer 10/04/2021   Malignant neoplasm of  upper-outer quadrant of right breast in female, estrogen receptor negative ([place]) 09/28/2021   Hypertension    Mild mitral regurgitation 06/2019   Palpitations 07/26/2014   Mitral regurgitation 07/26/2014    PCP: Clemmie Krill PA-C  REFERRING PROVIDER: Dr. Autumn Messing  REFERRING DIAG: Rt breast cancer  THERAPY DIAG:  Abnormal posture  Aftercare following surgery for neoplasm  Malignant neoplasm of upper-outer quadrant of right breast in female, estrogen receptor positive ([place])  At risk for lymphedema  Rationale for Evaluation and Treatment: Rehabilitation  ONSET DATE: 09/25/21  SUBJECTIVE:  SUBJECTIVE STATEMENT: I told my husband last night that my arm almost feels back to normal.   PERTINENT HISTORY:  Patient was diagnosed on 09/25/2021 with right grade 3 invasive ductal carcinoma breast cancer. It measures 2.5 cm and is located in the upper outer quadrant. It is triple negative with a Ki67 of 95%. Completed chemotherapy AC, carboplatin, Paclitaxel, and keytruda. Rt lumpectomy and SLNB on 05/04/22 with 2 negative nodes removed.    PATIENT GOALS:  Reassess how my recovery is going related to arm function, pain, and swelling.  PAIN:  Are you having pain? No, the stretches have been helping   PRECAUTIONS: Recent Surgery, right UE Lymphedema risk  ACTIVITY LEVEL / LEISURE: back to normal   OBJECTIVE:   PATIENT SURVEYS:  QUICK DASH: 4.55% from 4.55%  OBSERVATIONS: Well healed incision, no axillary edema noted,  cording noted from axilla to ulnar side of the wrist . 3 good sized cords in the antecubital fossa.   POSTURE:  Rounded shoulders   LYMPHEDEMA ASSESSMENT:   UPPER EXTREMITY AROM/PROM:   A/PROM RIGHT   eval   06/04/22 06/14/22  Shoulder extension 42 45   Shoulder flexion  154 130 pull  140 - pull in the elbow  Shoulder abduction 163 105 pull from cording  150 - pull in the elbow   Shoulder internal rotation 64    Shoulder external rotation 80 90                           (Blank rows = not tested)   A/PROM LEFT   eval  Shoulder extension 47  Shoulder flexion 142  Shoulder abduction 161  Shoulder internal rotation 60  Shoulder external rotation 90                          (Blank rows = not tested)     CERVICAL AROM: All within normal limits   UPPER EXTREMITY STRENGTH: WFL    LYMPHEDEMA ASSESSMENTS:    LANDMARK RIGHT   eval 06/04/22    31.2  10 cm proximal to olecranon process 25.5 28.4  Olecranon process 23 24.6    23.3  10 cm proximal to ulnar styloid process 19.8 20.5  Just proximal to ulnar styloid process 14 14.6  Across hand at thumb web space 17.5 18.5  At base of 2nd digit 5.9 5.9  (Blank rows = not tested) Length 6.5cm   LANDMARK LEFT   eval 06/04/22  15cm  31.7  10 cm proximal to olecranon process 26.7 29  Olecranon process 23.5 25  15cm  22  10 cm proximal to ulnar styloid process 19.3 19.1  Just proximal to ulnar styloid process 13.3 14.6  Across hand at thumb web space 17.5 18.5  At base of 2nd digit 5.8 6.1  (Blank rows = not tested)  TODAY'S TREATMENT Pt permission and consent throughout each step of examination and treatment with modification and draping if requested when working on sensitive areas 06/22/22: Therapeutic Exercises Pulleys into flexion and then abd x2 mins with tactile and VC's to decrease Rt scapular compensation Ball roll up wall into flexion and then Rt UE abd x10 each with tactile and VC's to decrease Rt scapular compensation.  Supine over foam roll bil UE scpation into a "V" x 10 and then bil UE into abd "snow angel" x10, 5 sec holds  Instructed pt in supine scapular series with yellow theraband  x 10 reps returning therapist demo for each  Manual Therapy MFR done at antecubital fossa where pt still  with very tight PROM with pinning into flexion, abduction and D2  06/19/22: Therapeutic Exercises Pulleys into flexion x10 and then abd x2 mins with tactile and VC's to decrease Rt scapular compensation Ball roll up wall into flexion and then Rt UE abd x10 each with tactile and VC's to decrease Rt scapular compensation. Pt is able to do this with cuing but does still struggle.  Modified downward dog on wall 5x, 5 sec holds Manual Therapy MFR done at antecubital fossa where pt still with very tight PROM with pinning into flexion, abduction and D2  06/14/22; Checked AROM Pulleys into flexion x 10 and abduction x 10 Ball flexion and abduction x 5 each MFR done at antecubital fossa where pt still with very tight, also to axilla which limits her end shoulder range PROM with pinning into flexion and abduction    PATIENT EDUCATION:  Education details: Self MLD, wera and care of newly issued compression garments Person educated: Patient Education method: Explanation, Demonstration, Verbal cues, and Handouts Education comprehension: verbalized understanding, returned demonstration, and needs further education  HOME EXERCISE PROGRAM: Access Code: [email or web address] URL: [email or web address] Date: 06/04/2022 Prepared by: Shan Levans  Exercises - Supine Shoulder Flexion Extension AAROM with Dowel  - 1-2 x daily - 7 x weekly - 10 reps - 5 seconds hold - Supine Chest Stretch with Elbows Bent  - 1 x daily - 7 x weekly - 1 sets - 3 reps - 30-60seconds hold - Standing Shoulder Flexion Wall Walk  - 1 x daily - 7 x weekly - 1-3 sets - 10 reps - 20-30 seconds hold - Standing Pec Stretch at Wall  - 1 x daily - 7 x weekly - 1-3 sets - 10 reps - 20-30 seconds hold   ASSESSMENT:  CLINICAL IMPRESSION: Progressed pt to included A/ROM stretches over foam roll which she reported feeling a good stretch with. Then progressed HEP to include supine scapular series yellow theraband and issued handout for  that. Also continued with manual therapy working to decrease remaining Rt upper quadrant tightness.   Pt will benefit from skilled therapeutic intervention to improve on the following deficits: Decreased knowledge of precautions, impaired UE functional use, pain, decreased ROM, postural dysfunction.   PT treatment/interventions: ADL/Self care home management, Therapeutic exercises, Patient/Family education, Self Care, Manual therapy, and Re-evaluation   GOALS: Goals reviewed with patient? Yes  LONG TERM GOALS:  (STG=LTG)  GOALS Name Target Date  Goal status  1 Pt will demonstrate she has regained full shoulder ROM and function post operatively compared to baselines.  Baseline: 07/01/32 INITIAL  2 Pt will be ind with final HEP 06/04/22 INITIAL  3 Pt will be scheduled out for SOZO surveillance and ABC class for lymphedema risk reduction 06/04/22 Initial          PLAN:  PT FREQUENCY/DURATION: 2x per week x 4 weeks and SOZO  PLAN FOR NEXT SESSION: D/C end of next week per POC; Review supine scapular series; Cont MFR to Rt UE cording release/PROM/stretches; MLD to the UE due to high SOZO score. Retest SOZO around 07/04/22    [facility]  [address]  [place]  [address]  [phone]    Otelia Limes, PTA 06/22/2022, 9:04 AM     Over Head Pull: Narrow and Wide Grip   Cancer Rehab [phone]   On back, knees bent,  feet flat, band across thighs, elbows straight but relaxed. Pull hands apart (start). Keeping elbows straight, bring arms up and over head, hands toward floor. Keep pull steady on band. Hold momentarily. Return slowly, keeping pull steady, back to start. Then do same with a wider grip on the band (past shoulder width) Repeat _5-10__ times. Band color __yellow____   Side Pull: Double Arm   On back, knees bent, feet flat. Arms perpendicular to body, shoulder level, elbows straight but relaxed. Pull arms out to sides, elbows  straight. Resistance band comes across collarbones, hands toward floor. Hold momentarily. Slowly return to starting position. Repeat _5-10__ times. Band color _yellow____   Sword   On back, knees bent, feet flat, left hand on left hip, right hand above left. Pull right arm DIAGONALLY (hip to shoulder) across chest. Bring right arm along head toward floor. Hold momentarily. Slowly return to starting position. Repeat _5-10__ times. Do with left arm. Band color _yellow_____   Shoulder Rotation: Double Arm   On back, knees bent, feet flat, elbows tucked at sides, bent 90, hands palms up. Pull hands apart and down toward floor, keeping elbows near sides. Hold momentarily. Slowly return to starting position. Repeat _5-10__ times. Band color __yellow____

## 2022-06-25 ENCOUNTER — Ambulatory Visit: Payer: BC Managed Care – PPO

## 2022-06-26 ENCOUNTER — Other Ambulatory Visit: Payer: Self-pay

## 2022-06-26 ENCOUNTER — Ambulatory Visit: Payer: BC Managed Care – PPO

## 2022-06-26 ENCOUNTER — Ambulatory Visit
Admission: RE | Admit: 2022-06-26 | Discharge: 2022-06-26 | Disposition: A | Discharge status: Home / Self Care | Payer: BC Managed Care – PPO | Source: Ambulatory Visit | Attending: Radiation Oncology | Admitting: Radiation Oncology

## 2022-06-26 VITALS — Wt 155.5 lb

## 2022-06-26 DIAGNOSIS — R293 Abnormal posture: Secondary | ICD-10-CM

## 2022-06-26 DIAGNOSIS — C50411 Malignant neoplasm of upper-outer quadrant of right female breast: Secondary | ICD-10-CM | POA: Diagnosis not present

## 2022-06-26 DIAGNOSIS — Z9189 Other specified personal risk factors, not elsewhere classified: Secondary | ICD-10-CM

## 2022-06-26 DIAGNOSIS — Z483 Aftercare following surgery for neoplasm: Secondary | ICD-10-CM

## 2022-06-26 DIAGNOSIS — Z170 Estrogen receptor positive status [ER+]: Secondary | ICD-10-CM

## 2022-06-26 LAB — RAD ONC ARIA SESSION SUMMARY
Course Elapsed Days: 15
Plan Fractions Treated to Date: 10
Plan Prescribed Dose Per Fraction: 2.66 Gy
Plan Total Fractions Prescribed: 16
Plan Total Prescribed Dose: 42.56 Gy
Reference Point Dosage Given to Date: 26.6 Gy
Reference Point Session Dosage Given: 2.66 Gy
Session Number: 10

## 2022-06-26 NOTE — Therapy (Addendum)
OUTPATIENT PHYSICAL THERAPY BREAST CANCER TREATMENT   Patient Name: Wendy Webb MRN: [id number] DOB:[birth year], 57 y.o., female Today's Date: 06/26/2022  END OF SESSION:  PT End of Session - 06/26/22 0915     Visit Number 8    Number of Visits 9    Date for PT Re-Evaluation 07/02/22    PT Start Time 0903    PT Stop Time 1005    PT Time Calculation (min) 62 min    Activity Tolerance Patient tolerated treatment well    Behavior During Therapy [facility] for tasks assessed/performed             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[place])    09/26/2021 (right breast biopsy)   Mild mitral regurgitation 06/2019   Palpitations    PONV (postoperative nausea and vomiting)    Past Surgical History:  Procedure Laterality Date   BREAST BIOPSY  05/03/2022   MM RT RADIOACTIVE SEED LOC MAMMO GUIDE 05/03/2022 [facility] MAMMOGRAPHY   BREAST LUMPECTOMY WITH RADIOACTIVE SEED AND SENTINEL LYMPH NODE BIOPSY Right 05/04/2022   Procedure: RIGHT BREAST LUMPECTOMY WITH RADIOACTIVE SEED AND SENTINEL LYMPH NODE BIOPSY;  Surgeon: Jovita Kussmaul, MD;  Location: [place];  Service: General;  Laterality: Right;   CHOLECYSTECTOMY     CYST REMOVAL TRUNK Right 05/04/2022   Procedure: CYST REMOVAL AXILLA;  Surgeon: Jovita Kussmaul, MD;  Location: [place];  Service: General;  Laterality: Right;   GALLBLADDER SURGERY     PORTACATH PLACEMENT Left 10/18/2021   Procedure: PLA CEMENT OF PORT;  Surgeon: Jovita Kussmaul, MD;  Location: [place];  Service: General;  Laterality: Left;   Patient Active Problem List   Diagnosis Date Noted   Chemotherapy-induced neuropathy ([place]) 02/23/2022   Bone pain due to G-CSF 02/23/2022   Genetic testing 10/23/2021   Port-A-Cath in place 10/20/2021   Family history of breast cancer 10/04/2021   Malignant neoplasm of  upper-outer quadrant of right breast in female, estrogen receptor negative ([place]) 09/28/2021   Hypertension    Mild mitral regurgitation 06/2019   Palpitations 07/26/2014   Mitral regurgitation 07/26/2014    PCP: Clemmie Krill PA-C  REFERRING PROVIDER: Dr. Autumn Messing  REFERRING DIAG: Rt breast cancer  THERAPY DIAG:  Abnormal posture  Aftercare following surgery for neoplasm  Malignant neoplasm of upper-outer quadrant of right breast in female, estrogen receptor positive ([place])  At risk for lymphedema  Rationale for Evaluation and Treatment: Rehabilitation  ONSET DATE: 09/25/21  SUBJECTIVE:  SUBJECTIVE STATEMENT: My theraband exercises are going well. I am doing really well and am ready to make today my last visit.   PERTINENT HISTORY:  Patient was diagnosed on 09/25/2021 with right grade 3 invasive ductal carcinoma breast cancer. It measures 2.5 cm and is located in the upper outer quadrant. It is triple negative with a Ki67 of 95%. Completed chemotherapy AC, carboplatin, Paclitaxel, and keytruda. Rt lumpectomy and SLNB on 05/04/22 with 2 negative nodes removed.    PATIENT GOALS:  Reassess how my recovery is going related to arm function, pain, and swelling.  PAIN:  Are you having pain? No, the stretches have been helping   PRECAUTIONS: Recent Surgery, right UE Lymphedema risk  ACTIVITY LEVEL / LEISURE: back to normal   OBJECTIVE:   PATIENT SURVEYS:  QUICK DASH: 4.55% from 4.55%  OBSERVATIONS: Well healed incision, no axillary edema noted,  cording noted from axilla to ulnar side of the wrist . 3 good sized cords in the antecubital fossa.   POSTURE:  Rounded shoulders   LYMPHEDEMA ASSESSMENT:   UPPER EXTREMITY AROM/PROM:   A/PROM RIGHT   eval   06/04/22 06/14/22  Shoulder  extension 42 45   Shoulder flexion 154 130 pull  140 - pull in the elbow  Shoulder abduction 163 105 pull from cording  150 - pull in the elbow   Shoulder internal rotation 64    Shoulder external rotation 80 90                           (Blank rows = not tested)   A/PROM LEFT   eval Left 06/26/22  Shoulder extension 47 70  Shoulder flexion 142 151  Shoulder abduction 161 167  Shoulder internal rotation 60 73  Shoulder external rotation 90 90                          (Blank rows = not tested)     CERVICAL AROM: All within normal limits   UPPER EXTREMITY STRENGTH: WFL    LYMPHEDEMA ASSESSMENTS:    LANDMARK RIGHT   eval 06/04/22    31.2  10 cm proximal to olecranon process 25.5 28.4  Olecranon process 23 24.6    23.3  10 cm proximal to ulnar styloid process 19.8 20.5  Just proximal to ulnar styloid process 14 14.6  Across hand at thumb web space 17.5 18.5  At base of 2nd digit 5.9 5.9  (Blank rows = not tested) Length 6.5cm   LANDMARK LEFT   eval 06/04/22  15cm  31.7  10 cm proximal to olecranon process 26.7 29  Olecranon process 23.5 25  15cm  22  10 cm proximal to ulnar styloid process 19.3 19.1  Just proximal to ulnar styloid process 13.3 14.6  Across hand at thumb web space 17.5 18.5  At base of 2nd digit 5.8 6.1  (Blank rows = not tested)    L-DEX FLOWSHEETS - 06/26/22 1000       L-DEX LYMPHEDEMA SCREENING   Measurement Type Unilateral    L-DEX MEASUREMENT EXTREMITY Upper Extremity    POSITION  Standing    DOMINANT SIDE Left    At Risk Side Right    BASELINE SCORE (UNILATERAL) 5.6    L-DEX SCORE (UNILATERAL) 11.1    VALUE CHANGE (UNILAT) 5.5             TODAY'S TREATMENT Pt permission and consent throughout  each step of examination and treatment with modification and draping if requested when working on sensitive areas 06/26/22: Pulleys into flexion and then abd x2 mins with tactile and VC's to decrease Rt scapular compensation Ball roll up  wall into flexion and then Rt UE abd x10 each with tactile and VC's to decrease Rt scapular compensation.  FreeMotion: 7# for bil scap retract x 10, then bil 3# bil ext x 10, both with core engaged and returning therapist demo A/ROM remeasured for goal assess. Manual Therapy PROM briefly into flexion, abduction and D2 but pt with near full motion so focused on MLD MLD: In Supine: Short neck, 5 diaphragmatic breaths, Rt inguinal and Lt axillary nodes, Rt axillo-inguinal and anterior inter-axillary anastomosis, then Rt UE working from proximal to distal, then retracing all steps.  Redid SOZO and pts change from baseline is back WNLs.   06/22/22: Therapeutic Exercises Pulleys into flexion and then abd x2 mins with tactile and VC's to decrease Rt scapular compensation Ball roll up wall into flexion and then Rt UE abd x10 each with tactile and VC's to decrease Rt scapular compensation.  Supine over foam roll bil UE scpation into a "V" x 10 and then bil UE into abd "snow angel" x10, 5 sec holds  Instructed pt in supine scapular series with yellow theraband x 10 reps returning therapist demo for each  Manual Therapy MFR done at antecubital fossa where pt still with very tight PROM with pinning into flexion, abduction and D2  06/19/22: Therapeutic Exercises Pulleys into flexion x10 and then abd x2 mins with tactile and VC's to decrease Rt scapular compensation Ball roll up wall into flexion and then Rt UE abd x10 each with tactile and VC's to decrease Rt scapular compensation. Pt is able to do this with cuing but does still struggle.  Modified downward dog on wall 5x, 5 sec holds Manual Therapy MFR done at antecubital fossa where pt still with very tight PROM with pinning into flexion, abduction and D2     PATIENT EDUCATION:  Education details: Self MLD, wera and care of newly issued compression garments Person educated: Patient Education method: Explanation, Demonstration, Verbal cues, and  Handouts Education comprehension: verbalized understanding, returned demonstration, and needs further education  HOME EXERCISE PROGRAM: Access Code: [email or web address] URL: [email or web address] Date: 06/04/2022 Prepared by: Shan Levans  Exercises - Supine Shoulder Flexion Extension AAROM with Dowel  - 1-2 x daily - 7 x weekly - 10 reps - 5 seconds hold - Supine Chest Stretch with Elbows Bent  - 1 x daily - 7 x weekly - 1 sets - 3 reps - 30-60seconds hold - Standing Shoulder Flexion Wall Walk  - 1 x daily - 7 x weekly - 1-3 sets - 10 reps - 20-30 seconds hold - Standing Pec Stretch at Wall  - 1 x daily - 7 x weekly - 1-3 sets - 10 reps - 20-30 seconds hold   ASSESSMENT:  CLINICAL IMPRESSION: Reviewed pts goals and she has met all at this time. Her SOZO score is also back WNLs so cautioned her that it would be best to cont wearing her compression sleeve and glove or gauntlet at least a few hours a day since she is still undergoing radiation and works in her garden for hours at a time. 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 Pt verbalized understanding importance of this and is ready for D/C at this time. She will cont every  3 month L-Dex screens.    Pt will benefit from skilled therapeutic intervention to improve on the following deficits: Decreased knowledge of precautions, impaired UE functional use, pain, decreased ROM, postural dysfunction.   PT treatment/interventions: ADL/Self care home management, Therapeutic exercises, Patient/Family education, Self Care, Manual therapy, and Re-evaluation   GOALS: Goals reviewed with patient? Yes  LONG TERM GOALS:  (STG=LTG)  GOALS Name Target Date  Goal status  1 Pt will demonstrate she has regained full shoulder ROM and function post operatively compared to baselines.  Baseline: 07/02/22 MET - 06/26/22, see chart above  2 Pt will be ind with final HEP 07/02/22 MET - 06/26/22 Pt is independent  with HEP and knows how to safely progress keeping her risk of lymphedema low  3 Pt will be scheduled out for SOZO surveillance and ABC class for lymphedema risk reduction 07/02/22 MET - 06/26/22           PLAN:  PT FREQUENCY/DURATION: 2x per week x 4 weeks and SOZO  PLAN FOR NEXT SESSION: D/C this visit; cont every 3 month L-Dex screens.     [facility]  [address]  [place] [address]  [phone]    Hermenia Bers, PTA 06/26/2022, 11:47 AM     Over Head Pull: Narrow and Wide Grip   Cancer Rehab [phone]   On back, knees bent, feet flat, band across thighs, elbows straight but relaxed. Pull hands apart (start). Keeping elbows straight, bring arms up and over head, hands toward floor. Keep pull steady on band. Hold momentarily. Return slowly, keeping pull steady, back to start. Then do same with a wider grip on the band (past shoulder width) Repeat _5-10__ times. Band color __yellow____   Side Pull: Double Arm   On back, knees bent, feet flat. Arms perpendicular to body, shoulder level, elbows straight but relaxed. Pull arms out to sides, elbows straight. Resistance band comes across collarbones, hands toward floor. Hold momentarily. Slowly return to starting position. Repeat _5-10__ times. Band color _yellow____   Sword   On back, knees bent, feet flat, left hand on left hip, right hand above left. Pull right arm DIAGONALLY (hip to shoulder) across chest. Bring right arm along head toward floor. Hold momentarily. Slowly return to starting position. Repeat _5-10__ times. Do with left arm. Band color _yellow_____   Shoulder Rotation: Double Arm   On back, knees bent, feet flat, elbows tucked at sides, bent 90, hands palms up. Pull hands apart and down toward floor, keeping elbows near sides. Hold momentarily. Slowly return to starting position. Repeat _5-10__ times. Band color __yellow____    PHYSICAL THERAPY DISCHARGE  SUMMARY  Visits from Start of Care: 8  Current functional level related to goals / functional outcomes: See above   Remaining deficits: Lymphedema risk   Education / Equipment: Final HEP  Plan: Patient agrees to discharge.  Patient is being discharged due to meeting the stated rehab goals.

## 2022-06-27 ENCOUNTER — Other Ambulatory Visit: Payer: Self-pay

## 2022-06-27 ENCOUNTER — Ambulatory Visit
Admission: RE | Admit: 2022-06-27 | Discharge: 2022-06-27 | Disposition: A | Discharge status: Home / Self Care | Payer: BC Managed Care – PPO | Source: Ambulatory Visit | Attending: Radiation Oncology | Admitting: Radiation Oncology

## 2022-06-27 DIAGNOSIS — C50411 Malignant neoplasm of upper-outer quadrant of right female breast: Secondary | ICD-10-CM | POA: Diagnosis not present

## 2022-06-27 LAB — RAD ONC ARIA SESSION SUMMARY
Course Elapsed Days: 16
Plan Fractions Treated to Date: 11
Plan Prescribed Dose Per Fraction: 2.66 Gy
Plan Total Fractions Prescribed: 16
Plan Total Prescribed Dose: 42.56 Gy
Reference Point Dosage Given to Date: 29.26 Gy
Reference Point Session Dosage Given: 2.66 Gy
Session Number: 11

## 2022-06-28 ENCOUNTER — Ambulatory Visit
Admission: RE | Admit: 2022-06-28 | Discharge: 2022-06-28 | Disposition: A | Discharge status: Home / Self Care | Payer: BC Managed Care – PPO | Source: Ambulatory Visit | Attending: Radiation Oncology | Admitting: Radiation Oncology

## 2022-06-28 ENCOUNTER — Other Ambulatory Visit: Payer: Self-pay

## 2022-06-28 DIAGNOSIS — C50411 Malignant neoplasm of upper-outer quadrant of right female breast: Secondary | ICD-10-CM | POA: Diagnosis not present

## 2022-06-28 LAB — RAD ONC ARIA SESSION SUMMARY
Course Elapsed Days: 17
Plan Fractions Treated to Date: 12
Plan Prescribed Dose Per Fraction: 2.66 Gy
Plan Total Fractions Prescribed: 16
Plan Total Prescribed Dose: 42.56 Gy
Reference Point Dosage Given to Date: 31.92 Gy
Reference Point Session Dosage Given: 2.66 Gy
Session Number: 12

## 2022-06-29 ENCOUNTER — Other Ambulatory Visit: Payer: Self-pay

## 2022-06-29 ENCOUNTER — Ambulatory Visit: Payer: BC Managed Care – PPO

## 2022-06-29 ENCOUNTER — Ambulatory Visit
Admission: RE | Admit: 2022-06-29 | Discharge: 2022-06-29 | Disposition: A | Discharge status: Home / Self Care | Payer: BC Managed Care – PPO | Source: Ambulatory Visit | Attending: Radiation Oncology | Admitting: Radiation Oncology

## 2022-06-29 DIAGNOSIS — C50411 Malignant neoplasm of upper-outer quadrant of right female breast: Secondary | ICD-10-CM | POA: Diagnosis not present

## 2022-06-29 LAB — RAD ONC ARIA SESSION SUMMARY
Course Elapsed Days: 18
Plan Fractions Treated to Date: 13
Plan Prescribed Dose Per Fraction: 2.66 Gy
Plan Total Fractions Prescribed: 16
Plan Total Prescribed Dose: 42.56 Gy
Reference Point Dosage Given to Date: 34.58 Gy
Reference Point Session Dosage Given: 2.66 Gy
Session Number: 13

## 2022-07-02 ENCOUNTER — Other Ambulatory Visit: Payer: Self-pay

## 2022-07-02 ENCOUNTER — Inpatient Hospital Stay (HOSPITAL_BASED_OUTPATIENT_CLINIC_OR_DEPARTMENT_OTHER): Payer: BC Managed Care – PPO | Admitting: Hematology and Oncology

## 2022-07-02 ENCOUNTER — Ambulatory Visit
Admission: RE | Admit: 2022-07-02 | Discharge: 2022-07-02 | Disposition: A | Discharge status: Home / Self Care | Payer: BC Managed Care – PPO | Source: Ambulatory Visit | Attending: Radiation Oncology | Admitting: Radiation Oncology

## 2022-07-02 VITALS — BP 142/65 | HR 73 | Temp 98.2°F | Resp 18 | Ht 67.0 in | Wt 155.5 lb

## 2022-07-02 DIAGNOSIS — C50411 Malignant neoplasm of upper-outer quadrant of right female breast: Secondary | ICD-10-CM | POA: Diagnosis not present

## 2022-07-02 DIAGNOSIS — G620 Drug-induced polyneuropathy: Secondary | ICD-10-CM

## 2022-07-02 DIAGNOSIS — Z171 Estrogen receptor negative status [ER-]: Secondary | ICD-10-CM

## 2022-07-02 DIAGNOSIS — T451X5A Adverse effect of antineoplastic and immunosuppressive drugs, initial encounter: Secondary | ICD-10-CM

## 2022-07-02 LAB — RAD ONC ARIA SESSION SUMMARY
Course Elapsed Days: 21
Plan Fractions Treated to Date: 14
Plan Prescribed Dose Per Fraction: 2.66 Gy
Plan Total Fractions Prescribed: 16
Plan Total Prescribed Dose: 42.56 Gy
Reference Point Dosage Given to Date: 37.24 Gy
Reference Point Session Dosage Given: 2.66 Gy
Session Number: 14

## 2022-07-02 NOTE — Progress Notes (Signed)
[facility] Follow up:    Wendy Kaufman, PA-C [address] [place] [address]   DIAGNOSIS:  Cancer Staging  Malignant neoplasm of upper-outer quadrant of right breast in female, estrogen receptor negative ([place]) Staging form: Breast, AJCC 8th Edition - Clinical stage from 10/02/2021: Stage IIB (cT2, cN0, cM0, G3, ER-, PR-, HER2-) - Unsigned Stage prefix: Initial diagnosis Method of lymph node assessment: Clinical Histologic grading system: 3 grade system   SUMMARY OF ONCOLOGIC HISTORY: Oncology History  Malignant neoplasm of upper-outer quadrant of right breast in female, estrogen receptor negative ([place])  09/25/2021 Imaging   Bilateral screening mammogram with irregular hypoechoic mass in the right breast at 12:00 axis, 5 cm from nipple measuring 2.5 cm corresponding to the palpable area of concern. Ultrasound confirmed irregular hypoechoic mass in the right breast at the 12:00 5 cm from the nipple measuring 2.5 cm corresponding to the palpable area of concern.   09/26/2021 Pathology Results   Right breast needle core biopsy showed invasive ductal carcinoma, high-grade prognostic showed ER 0% negative PR 0% negative Ki-67 of 95% and HER2 negative   09/28/2021 Initial Diagnosis   Malignant neoplasm of upper-outer quadrant of right breast in female, estrogen receptor negative ([place])   10/16/2021 Genetic Testing   Negative hereditary cancer genetic testing: no pathogenic variants detected in [facility] CustomNext-Cancer +RNAinsight Panel.  Report date is October 16, 2021.   The CustomNext-Cancer+RNAinsight panel offered by [facility] includes sequencing and rearrangement analysis for the following 47 genes:  APC, ATM, AXIN2, BARD1, BMPR1A, BRCA1, BRCA2, BRIP1, CDH1, CDK4, CDKN2A, CHEK2, DICER1, EPCAM, GREM1, HOXB13, MEN1, MLH1, MSH2, MSH3, MSH6, MUTYH, NBN, NF1, NF2, NTHL1, PALB2, PMS2, POLD1, POLE, PTEN, RAD51C, RAD51D, RECQL, RET, SDHA, SDHAF2, SDHB, SDHC, SDHD,  SMAD4, SMARCA4, STK11, TP53, TSC1, TSC2, and VHL.  RNA data is routinely analyzed for use in variant interpretation for all genes.    10/20/2021 - 12/02/2021 Chemotherapy   Patient is on Treatment Plan : BREAST  Pembrolizumab (200) D1 + AC D1 q21d x 4 cycles / Pembrolizumab (200) D1 + Carboplatin (1.5) D1,8,15 + Paclitaxel (80) D1,8,15 q21d X 4 cycles     10/20/2021 - 03/16/2022 Chemotherapy   Patient is on Treatment Plan : BREAST Pembrolizumab (200) D1 + AC D1 q21d x 4 cycles / Pembrolizumab (200) D1 + Carboplatin (1.5) D1,8,15 + Paclitaxel (80) D1,8,15 q21d X 4 cycles       CURRENT THERAPY: Neoadjuvant chemotherapy  INTERVAL HISTORY:  Wendy Webb 57 y.o. female returns for follow-up  Since her last visit here, she denies any new health complaints today She still has some neuropathy in her feet which she is recovering from, overall not significantly different.  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  Otherwise some radiation related skin changes noted in the right breast.  She tells me that she is very sure that she does not want to do any adjuvant immunotherapy.  Rest of the pertinent 10 point ROS reviewed and negative   Patient Active Problem List   Diagnosis Date Noted   Chemotherapy-induced neuropathy ([place]) 02/23/2022   Bone pain due to G-CSF 02/23/2022   Genetic testing 10/23/2021   Port-A-Cath in place 10/20/2021   Family history of breast cancer 10/04/2021   Malignant neoplasm of upper-outer quadrant of right breast in female, estrogen receptor negative ([place]) 09/28/2021   Hypertension    Mild mitral regurgitation  06/2019   Palpitations 07/26/2014   Mitral regurgitation 07/26/2014    is allergic to ace inhibitors and shellfish allergy.  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[place])    09/26/2021 (right breast biopsy)   Mild mitral regurgitation 06/2019   Palpitations    PONV (postoperative nausea and vomiting)     SURGICAL HISTORY: Past Surgical History:  Procedure Laterality Date   BREAST BIOPSY  05/03/2022   MM RT RADIOACTIVE SEED LOC MAMMO GUIDE 05/03/2022 [facility] MAMMOGRAPHY   BREAST LUMPECTOMY WITH RADIOACTIVE SEED AND SENTINEL LYMPH NODE BIOPSY Right 05/04/2022   Procedure: RIGHT BREAST LUMPECTOMY WITH RADIOACTIVE SEED AND SENTINEL LYMPH NODE BIOPSY;  Surgeon: Jovita Kussmaul, MD;  Location: [place];  Service: General;  Laterality: Right;   CHOLECYSTECTOMY     CYST REMOVAL TRUNK Right 05/04/2022   Procedure: CYST REMOVAL AXILLA;  Surgeon: Jovita Kussmaul, MD;  Location: [place];  Service: General;  Laterality: Right;   GALLBLADDER SURGERY     PORTACATH PLACEMENT Left 10/18/2021   Procedure: PLA CEMENT OF PORT;  Surgeon: Jovita Kussmaul, MD;  Location: [place];  Service: General;  Laterality: Left;    SOCIAL HISTORY: Social History   Socioeconomic History   Marital status: Married    Spouse name: Not on file   Number of children: 2   Years of education: Not on file   Highest education level: Not on file  Occupational History    Comment: Hair dresser  Tobacco Use   Smoking status: Never   Smokeless tobacco: Never  Vaping Use   Vaping Use: Never used  Substance and Sexual Activity   Alcohol use: Yes    Comment: socially   Drug use: No   Sexual activity: Yes    Partners: Male  Other Topics Concern   Not on file  Social History Narrative   Pt is a hair stylist   Married (3rd marriage)   2 sons (both grown)   Enjoys yard work, hiking, Scientist, clinical (histocompatibility and immunogenetics)   Complete 2 year cosmetology   Has one Neurosurgeon   Social Determinants of Radio broadcast assistant Strain: Not on Art therapist Insecurity: Not on file  Transportation Needs: Not on file  Physical Activity: Not on file  Stress: Not on  file  Social Connections: Not on file  Intimate Partner Violence: Not on file    FAMILY HISTORY: Family History  Problem Relation Age of Onset   Hypertension Mother    Heart attack Mother    Heart disease Mother        Atrial fibrillation   Obstructive Sleep Apnea Mother    Hypertension Brother    Stroke Maternal Grandmother 44   Breast cancer Other 65       PGM's sister   Colon cancer Neg Hx    Esophageal cancer Neg Hx     Review of Systems  Constitutional:  Positive for fatigue. Negative for appetite change, chills, fever and unexpected weight change.  HENT:   Negative for hearing loss, lump/mass and trouble swallowing.   Eyes:  Negative for eye problems and icterus.  Respiratory:  Negative for chest tightness, cough and shortness of breath.   Cardiovascular:  Negative for chest pain, leg swelling and palpitations.  Gastrointestinal:  Negative for abdominal distention, abdominal pain, constipation, diarrhea,  nausea and vomiting.  Endocrine: Negative for hot flashes.  Genitourinary:  Negative for difficulty urinating.   Musculoskeletal:  Negative for arthralgias and myalgias.  Skin:  Negative for itching and rash.  Neurological:  Negative for dizziness, extremity weakness, headaches and numbness.       Neuropathy.  Hematological:  Negative for adenopathy. Does not bruise/bleed easily.  Psychiatric/Behavioral:  Negative for depression. The patient is not nervous/anxious.       PHYSICAL EXAMINATION  ECOG PERFORMANCE STATUS: 1 - Symptomatic but completely ambulatory  Vitals:   07/02/22 1127  BP: (!) 142/65  Pulse: 73  Resp: 18  Temp: 98.2 F (36.8 C)  SpO2: 100%   General appearance: Alert, oriented, no acute distress Right breast with ongoing radiation changes, erythema.   Chest clear to auscultation bilaterally No lower extremity edema  LABORATORY DATA:  CBC    Component Value Date/Time   WBC 2.5 (L) 05/07/2022 1125   WBC 8.1 01/05/2022 1531   RBC 3.22  (L) 05/07/2022 1125   HGB 10.9 (L) 05/07/2022 1125   HCT 32.3 (L) 05/07/2022 1125   PLT 174 05/07/2022 1125   MCV 100.3 (H) 05/07/2022 1125   MCH 33.9 05/07/2022 1125   MCHC 33.7 05/07/2022 1125   RDW 11.8 05/07/2022 1125   LYMPHSABS 0.8 05/07/2022 1125   MONOABS 0.3 05/07/2022 1125   EOSABS 0.1 05/07/2022 1125   BASOSABS 0.0 05/07/2022 1125    CMP     Component Value Date/Time   NA 139 05/07/2022 1125   K 3.8 05/07/2022 1125   CL 106 05/07/2022 1125   CO2 28 05/07/2022 1125   GLUCOSE 75 05/07/2022 1125   BUN 9 05/07/2022 1125   CREATININE 0.52 05/07/2022 1125   CALCIUM 8.8 (L) 05/07/2022 1125   PROT 6.5 05/07/2022 1125   ALBUMIN 3.7 05/07/2022 1125   AST 39 05/07/2022 1125   ALT 32 05/07/2022 1125   ALKPHOS 71 05/07/2022 1125   BILITOT 0.3 05/07/2022 1125   GFRNONAA >60 05/07/2022 1125   GFRAA >60 05/30/2019 1105    ASSESSMENT and THERAPY PLAN:   #Invasive poorly differentiated adenocarcinoma of the right breast- grade 3, triple negative, high proliferation index of 95%:  She had neoadjuvant keynote 522 regimen, had complete PCR.  She is now undergoing adjuvant radiation.  She declined any adjuvant immunotherapy.  She understands that this is standard of care recommendation but given the de-escalation trial that is starting Keytruda versus no Keytruda in patients will have PCR, she felt that she could omit it.  She also did some research and did not feel like she needed it.   No role for antiestrogen therapy, she had ER negative PR negative tumor.  She will follow-up with Dr. Marlou Starks alternating with [place] for follow-up.  She has a follow-up with Dr. Marlou Starks in the fall of this year hence I will plan to see her in February of next year.  Mammogram ordered for July 2024.  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  She did not try any medication for this.   All questions were answered. The patient knows to call the clinic with any problems, questions  or concerns. We can certainly see the patient much sooner if necessary.  Total encounter time:30 minutes*in face-to-face visit time, chart review, lab review, care coordination, order entry, and documentation of the encounter time.  *Total Encounter Time as defined by the Centers for Medicare and Medicaid Services includes, in addition to the face-to-face time  of a patient visit (documented in the note above) non-face-to-face time: obtaining and reviewing outside history, ordering and reviewing medications, tests or procedures, care coordination (communications with other health care professionals or caregivers) and documentation in the medical record.  

## 2022-07-03 ENCOUNTER — Ambulatory Visit: Payer: BC Managed Care – PPO

## 2022-07-03 ENCOUNTER — Other Ambulatory Visit: Payer: Self-pay

## 2022-07-03 DIAGNOSIS — C50411 Malignant neoplasm of upper-outer quadrant of right female breast: Secondary | ICD-10-CM | POA: Diagnosis not present

## 2022-07-03 LAB — RAD ONC ARIA SESSION SUMMARY
Course Elapsed Days: 22
Plan Fractions Treated to Date: 15
Plan Prescribed Dose Per Fraction: 2.66 Gy
Plan Total Fractions Prescribed: 16
Plan Total Prescribed Dose: 42.56 Gy
Reference Point Dosage Given to Date: 39.9 Gy
Reference Point Session Dosage Given: 2.66 Gy
Session Number: 15

## 2022-07-04 ENCOUNTER — Ambulatory Visit: Payer: BC Managed Care – PPO

## 2022-07-04 ENCOUNTER — Other Ambulatory Visit: Payer: Self-pay

## 2022-07-04 DIAGNOSIS — C50411 Malignant neoplasm of upper-outer quadrant of right female breast: Secondary | ICD-10-CM | POA: Diagnosis not present

## 2022-07-04 LAB — RAD ONC ARIA SESSION SUMMARY
Course Elapsed Days: 23
Plan Fractions Treated to Date: 16
Plan Prescribed Dose Per Fraction: 2.66 Gy
Plan Total Fractions Prescribed: 16
Plan Total Prescribed Dose: 42.56 Gy
Reference Point Dosage Given to Date: 42.56 Gy
Reference Point Session Dosage Given: 2.66 Gy
Session Number: 16

## 2022-07-05 ENCOUNTER — Ambulatory Visit: Payer: BC Managed Care – PPO

## 2022-07-05 ENCOUNTER — Encounter: Payer: Self-pay | Admitting: *Deleted

## 2022-07-05 ENCOUNTER — Other Ambulatory Visit: Payer: Self-pay

## 2022-07-05 DIAGNOSIS — Z171 Estrogen receptor negative status [ER-]: Secondary | ICD-10-CM

## 2022-07-05 DIAGNOSIS — C50411 Malignant neoplasm of upper-outer quadrant of right female breast: Secondary | ICD-10-CM | POA: Diagnosis not present

## 2022-07-05 LAB — RAD ONC ARIA SESSION SUMMARY
Course Elapsed Days: 24
Plan Fractions Treated to Date: 1
Plan Prescribed Dose Per Fraction: 2 Gy
Plan Total Fractions Prescribed: 4
Plan Total Prescribed Dose: 8 Gy
Reference Point Dosage Given to Date: 2 Gy
Reference Point Session Dosage Given: 2 Gy
Session Number: 17

## 2022-07-06 ENCOUNTER — Ambulatory Visit
Admission: RE | Admit: 2022-07-06 | Discharge: 2022-07-06 | Disposition: A | Discharge status: Home / Self Care | Payer: BC Managed Care – PPO | Source: Ambulatory Visit | Attending: Radiation Oncology | Admitting: Radiation Oncology

## 2022-07-06 ENCOUNTER — Ambulatory Visit: Payer: BC Managed Care – PPO

## 2022-07-06 ENCOUNTER — Other Ambulatory Visit: Payer: Self-pay

## 2022-07-06 DIAGNOSIS — C50411 Malignant neoplasm of upper-outer quadrant of right female breast: Secondary | ICD-10-CM | POA: Diagnosis not present

## 2022-07-06 LAB — RAD ONC ARIA SESSION SUMMARY
Course Elapsed Days: 25
Plan Fractions Treated to Date: 2
Plan Prescribed Dose Per Fraction: 2 Gy
Plan Total Fractions Prescribed: 4
Plan Total Prescribed Dose: 8 Gy
Reference Point Dosage Given to Date: 4 Gy
Reference Point Session Dosage Given: 2 Gy
Session Number: 18

## 2022-07-09 ENCOUNTER — Ambulatory Visit: Payer: BC Managed Care – PPO

## 2022-07-09 ENCOUNTER — Other Ambulatory Visit: Payer: Self-pay

## 2022-07-09 ENCOUNTER — Ambulatory Visit
Admission: RE | Admit: 2022-07-09 | Discharge: 2022-07-09 | Disposition: A | Discharge status: Home / Self Care | Payer: BC Managed Care – PPO | Source: Ambulatory Visit | Attending: Radiation Oncology | Admitting: Radiation Oncology

## 2022-07-09 DIAGNOSIS — Z510 Encounter for antineoplastic radiation therapy: Secondary | ICD-10-CM | POA: Insufficient documentation

## 2022-07-09 DIAGNOSIS — G620 Drug-induced polyneuropathy: Secondary | ICD-10-CM | POA: Diagnosis not present

## 2022-07-09 DIAGNOSIS — Z171 Estrogen receptor negative status [ER-]: Secondary | ICD-10-CM | POA: Diagnosis not present

## 2022-07-09 DIAGNOSIS — I10 Essential (primary) hypertension: Secondary | ICD-10-CM | POA: Diagnosis not present

## 2022-07-09 DIAGNOSIS — C50411 Malignant neoplasm of upper-outer quadrant of right female breast: Secondary | ICD-10-CM | POA: Insufficient documentation

## 2022-07-09 LAB — RAD ONC ARIA SESSION SUMMARY
Course Elapsed Days: 28
Plan Fractions Treated to Date: 3
Plan Prescribed Dose Per Fraction: 2 Gy
Plan Total Fractions Prescribed: 4
Plan Total Prescribed Dose: 8 Gy
Reference Point Dosage Given to Date: 6 Gy
Reference Point Session Dosage Given: 2 Gy
Session Number: 19

## 2022-07-10 ENCOUNTER — Ambulatory Visit
Admission: RE | Admit: 2022-07-10 | Discharge: 2022-07-10 | Disposition: A | Discharge status: Home / Self Care | Payer: BC Managed Care – PPO | Source: Ambulatory Visit | Attending: Radiation Oncology | Admitting: Radiation Oncology

## 2022-07-10 ENCOUNTER — Other Ambulatory Visit: Payer: Self-pay

## 2022-07-10 ENCOUNTER — Encounter: Payer: Self-pay | Admitting: Radiation Oncology

## 2022-07-10 DIAGNOSIS — C50411 Malignant neoplasm of upper-outer quadrant of right female breast: Secondary | ICD-10-CM | POA: Diagnosis not present

## 2022-07-10 LAB — RAD ONC ARIA SESSION SUMMARY
Course Elapsed Days: 29
Plan Fractions Treated to Date: 4
Plan Prescribed Dose Per Fraction: 2 Gy
Plan Total Fractions Prescribed: 4
Plan Total Prescribed Dose: 8 Gy
Reference Point Dosage Given to Date: 8 Gy
Reference Point Session Dosage Given: 2 Gy
Session Number: 20

## 2022-07-11 ENCOUNTER — Encounter: Payer: Self-pay | Admitting: Hematology and Oncology

## 2022-07-11 NOTE — Progress Notes (Signed)
  Radiation Oncology         [phone] ________________________________  Name: Wendy Webb MRN: [id number]  Date: 07/10/2022  DOB: 11-24-1965  End of Treatment Note  Diagnosis:    Stage IIB, cT2N0M0, grade 3, triple negative invasive ductal carcinoma of the right breast with complete pathologic response to neoadjuvant chemotherapy.    Indication for treatment:  Curative       Radiation treatment dates:   06/11/22-07/10/22  Site/dose:   The patient initially received a dose of 42.56 Gy in 16 fractions to the breast using whole-breast tangent fields. This was delivered using a 3-D conformal technique. The patient then received a boost to the seroma. This delivered an additional 8 Gy in 5fractions using a 3 field photon technique due to the depth of the seroma. The total dose was 50.56 Gy.  Narrative: The patient tolerated radiation treatment relatively well. She developed  anticipated skin changes in the treatment field without fatigue. Of note at one point she had interest in mepitel dressing, but this could not be allocated through the hospital or the manufacture representative, and while the patient initially was going to purchase the film herself online, she ultimately decided to forgo this.    Plan: The patient will receive a call in about one month from the radiation oncology department. She will continue follow up with Dr. Chryl Heck as well.     Carola Rhine, PAC

## 2022-07-18 ENCOUNTER — Ambulatory Visit: Payer: Self-pay | Admitting: General Surgery

## 2022-08-03 ENCOUNTER — Encounter (HOSPITAL_BASED_OUTPATIENT_CLINIC_OR_DEPARTMENT_OTHER): Payer: Self-pay | Admitting: General Surgery

## 2022-08-10 ENCOUNTER — Ambulatory Visit (HOSPITAL_BASED_OUTPATIENT_CLINIC_OR_DEPARTMENT_OTHER): Payer: BC Managed Care – PPO | Admitting: Anesthesiology

## 2022-08-10 ENCOUNTER — Ambulatory Visit (HOSPITAL_BASED_OUTPATIENT_CLINIC_OR_DEPARTMENT_OTHER)
Admission: RE | Admit: 2022-08-10 | Discharge: 2022-08-10 | Disposition: A | Discharge status: Home / Self Care | Payer: BC Managed Care – PPO | Attending: General Surgery | Admitting: General Surgery

## 2022-08-10 ENCOUNTER — Encounter (HOSPITAL_BASED_OUTPATIENT_CLINIC_OR_DEPARTMENT_OTHER)
Admission: RE | Disposition: A | Discharge status: Home / Self Care | Payer: Self-pay | Source: Home / Self Care | Attending: General Surgery

## 2022-08-10 ENCOUNTER — Other Ambulatory Visit: Payer: Self-pay

## 2022-08-10 ENCOUNTER — Encounter (HOSPITAL_BASED_OUTPATIENT_CLINIC_OR_DEPARTMENT_OTHER): Payer: Self-pay | Admitting: General Surgery

## 2022-08-10 DIAGNOSIS — Z8249 Family history of ischemic heart disease and other diseases of the circulatory system: Secondary | ICD-10-CM | POA: Insufficient documentation

## 2022-08-10 DIAGNOSIS — Z452 Encounter for adjustment and management of vascular access device: Secondary | ICD-10-CM | POA: Insufficient documentation

## 2022-08-10 DIAGNOSIS — C50411 Malignant neoplasm of upper-outer quadrant of right female breast: Secondary | ICD-10-CM | POA: Insufficient documentation

## 2022-08-10 DIAGNOSIS — I10 Essential (primary) hypertension: Secondary | ICD-10-CM | POA: Insufficient documentation

## 2022-08-10 DIAGNOSIS — Z171 Estrogen receptor negative status [ER-]: Secondary | ICD-10-CM | POA: Diagnosis not present

## 2022-08-10 HISTORY — PX: PORT-A-CATH REMOVAL: SHX5289

## 2022-08-10 SURGERY — REMOVAL PORT-A-CATH
Anesthesia: Monitor Anesthesia Care | Site: Chest | Laterality: Left

## 2022-08-10 MED ORDER — PROPOFOL 500 MG/50ML IV EMUL
INTRAVENOUS | Status: DC | PRN
  Administered 2022-08-10: 100 ug/kg/min via INTRAVENOUS

## 2022-08-10 MED ORDER — OXYCODONE HCL 5 MG PO TABS: 5.0000 mg | ORAL_TABLET | Freq: Once | ORAL | Status: DC | PRN

## 2022-08-10 MED ORDER — MIDAZOLAM HCL 2 MG/2ML IJ SOLN
INTRAMUSCULAR | Status: AC
  Filled 2022-08-10: qty 2

## 2022-08-10 MED ORDER — LIDOCAINE 2% (20 MG/ML) 5 ML SYRINGE
INTRAMUSCULAR | Status: AC
  Filled 2022-08-10: qty 5

## 2022-08-10 MED ORDER — AMISULPRIDE (ANTIEMETIC) 5 MG/2ML IV SOLN: 10.0000 mg | Freq: Once | INTRAVENOUS | Status: DC | PRN

## 2022-08-10 MED ORDER — ACETAMINOPHEN 500 MG PO TABS: 1000.0000 mg | ORAL_TABLET | Freq: Once | ORAL | Status: DC

## 2022-08-10 MED ORDER — ACETAMINOPHEN 160 MG/5ML PO SOLN: 325.0000 mg | ORAL | Status: DC | PRN

## 2022-08-10 MED ORDER — OXYCODONE HCL 5 MG/5ML PO SOLN: 5.0000 mg | Freq: Once | ORAL | Status: DC | PRN

## 2022-08-10 MED ORDER — BUPIVACAINE-EPINEPHRINE 0.25% -1:200000 IJ SOLN
INTRAMUSCULAR | Status: DC | PRN
  Administered 2022-08-10: 14 mL

## 2022-08-10 MED ORDER — PROPOFOL 10 MG/ML IV BOLUS
INTRAVENOUS | Status: DC | PRN
  Administered 2022-08-10: 40 mg via INTRAVENOUS

## 2022-08-10 MED ORDER — FENTANYL CITRATE (PF) 100 MCG/2ML IJ SOLN
INTRAMUSCULAR | Status: DC | PRN
  Administered 2022-08-10: 50 ug via INTRAVENOUS

## 2022-08-10 MED ORDER — CHLORHEXIDINE GLUCONATE CLOTH 2 % EX PADS: 6.0000 | MEDICATED_PAD | Freq: Once | CUTANEOUS | Status: DC

## 2022-08-10 MED ORDER — ACETAMINOPHEN 10 MG/ML IV SOLN: 1000.0000 mg | Freq: Once | INTRAVENOUS | Status: DC | PRN

## 2022-08-10 MED ORDER — ONDANSETRON HCL 4 MG/2ML IJ SOLN
INTRAMUSCULAR | Status: DC | PRN
  Administered 2022-08-10: 4 mg via INTRAVENOUS

## 2022-08-10 MED ORDER — PROMETHAZINE HCL 25 MG/ML IJ SOLN: 6.2500 mg | INTRAMUSCULAR | Status: DC | PRN

## 2022-08-10 MED ORDER — ONDANSETRON HCL 4 MG/2ML IJ SOLN
INTRAMUSCULAR | Status: AC
  Filled 2022-08-10: qty 2

## 2022-08-10 MED ORDER — FENTANYL CITRATE (PF) 100 MCG/2ML IJ SOLN
INTRAMUSCULAR | Status: AC
  Filled 2022-08-10: qty 2

## 2022-08-10 MED ORDER — LIDOCAINE HCL (CARDIAC) PF 100 MG/5ML IV SOSY
PREFILLED_SYRINGE | INTRAVENOUS | Status: DC | PRN
  Administered 2022-08-10: 40 mg via INTRAVENOUS

## 2022-08-10 MED ORDER — LACTATED RINGERS IV SOLN: INTRAVENOUS | Status: DC

## 2022-08-10 MED ORDER — ACETAMINOPHEN 325 MG PO TABS: 325.0000 mg | ORAL_TABLET | ORAL | Status: DC | PRN

## 2022-08-10 MED ORDER — MIDAZOLAM HCL 5 MG/5ML IJ SOLN
INTRAMUSCULAR | Status: DC | PRN
  Administered 2022-08-10: 2 mg via INTRAVENOUS

## 2022-08-10 MED ORDER — OXYCODONE HCL 5 MG PO TABS
5.0000 mg | ORAL_TABLET | Freq: Four times a day (QID) | ORAL | 0 refills | Status: DC | PRN
Start: 2022-08-10 — End: 2022-10-22

## 2022-08-10 MED ORDER — FENTANYL CITRATE (PF) 100 MCG/2ML IJ SOLN: 25.0000 ug | INTRAMUSCULAR | Status: DC | PRN

## 2022-08-10 MED ORDER — PROPOFOL 10 MG/ML IV BOLUS
INTRAVENOUS | Status: AC
  Filled 2022-08-10: qty 20

## 2022-08-10 SURGICAL SUPPLY — 27 items
ADH SKN CLS APL DERMABOND .7 (GAUZE/BANDAGES/DRESSINGS) ×1
APL PRP STRL LF DISP 70% ISPRP (MISCELLANEOUS) ×1
BLADE SURG 15 STRL LF DISP TIS (BLADE) ×1 IMPLANT
BLADE SURG 15 STRL SS (BLADE) ×1
CHLORAPREP W/TINT 26 (MISCELLANEOUS) ×1 IMPLANT
COVER BACK TABLE 60X90IN (DRAPES) ×1 IMPLANT
COVER MAYO STAND STRL (DRAPES) ×1 IMPLANT
DERMABOND ADVANCED .7 DNX12 (GAUZE/BANDAGES/DRESSINGS) ×1 IMPLANT
DRAPE LAPAROTOMY 100X72 PEDS (DRAPES) ×1 IMPLANT
DRAPE UTILITY XL STRL (DRAPES) ×1 IMPLANT
ELECT COATED BLADE 2.86 ST (ELECTRODE) IMPLANT
ELECT REM PT RETURN 9FT ADLT (ELECTROSURGICAL) ×1
ELECTRODE REM PT RTRN 9FT ADLT (ELECTROSURGICAL) IMPLANT
GLOVE BIO SURGEON STRL SZ7.5 (GLOVE) ×1 IMPLANT
GOWN STRL REUS W/ TWL LRG LVL3 (GOWN DISPOSABLE) ×2 IMPLANT
GOWN STRL REUS W/TWL LRG LVL3 (GOWN DISPOSABLE) ×2
NDL HYPO 25X1 1.5 SAFETY (NEEDLE) ×1 IMPLANT
NEEDLE HYPO 25X1 1.5 SAFETY (NEEDLE) ×1 IMPLANT
PACK BASIN DAY SURGERY FS (CUSTOM PROCEDURE TRAY) ×1 IMPLANT
PENCIL SMOKE EVACUATOR (MISCELLANEOUS) IMPLANT
SLEEVE SCD COMPRESS KNEE MED (STOCKING) IMPLANT
SPIKE FLUID TRANSFER (MISCELLANEOUS) ×1 IMPLANT
SUT MON AB 4-0 PC3 18 (SUTURE) ×1 IMPLANT
SUT VIC AB 3-0 SH 27 (SUTURE) ×1
SUT VIC AB 3-0 SH 27X BRD (SUTURE) ×1 IMPLANT
SYR CONTROL 10ML LL (SYRINGE) ×1 IMPLANT
TOWEL GREEN STERILE FF (TOWEL DISPOSABLE) ×1 IMPLANT

## 2022-08-10 NOTE — Anesthesia Preprocedure Evaluation (Signed)
Anesthesia Evaluation  Patient identified by MRN, date of birth, ID band Patient awake    Reviewed: Allergy & Precautions, NPO status , Patient's Chart, lab work & pertinent test results  History of Anesthesia Complications (+) PONV and history of anesthetic complications  Airway Mallampati: II  TM Distance: >3 FB Neck ROM: Full    Dental  (+) Teeth Intact, Dental Advisory Given   Pulmonary neg pulmonary ROS   breath sounds clear to auscultation       Cardiovascular hypertension,  Rhythm:Regular Rate:Normal     Neuro/Psych  negative psych ROS   GI/Hepatic negative GI ROS, Neg liver ROS,,,  Endo/Other  negative endocrine ROS    Renal/GU negative Renal ROS     Musculoskeletal negative musculoskeletal ROS (+)    Abdominal   Peds  Hematology negative hematology ROS (+)   Anesthesia Other Findings   Reproductive/Obstetrics                             Anesthesia Physical Anesthesia Plan  ASA: 2  Anesthesia Plan: MAC   Post-op Pain Management: Tylenol PO (pre-op)*   Induction: Intravenous  PONV Risk Score and Plan: 3 and Ondansetron, Propofol infusion, TIVA and Midazolam  Airway Management Planned: Natural Airway  Additional Equipment: None  Intra-op Plan:   Post-operative Plan:   Informed Consent: I have reviewed the patients History and Physical, chart, labs and discussed the procedure including the risks, benefits and alternatives for the proposed anesthesia with the patient or authorized representative who has indicated his/her understanding and acceptance.       Plan Discussed with: CRNA  Anesthesia Plan Comments:         Anesthesia Quick Evaluation

## 2022-08-10 NOTE — Discharge Instructions (Signed)

## 2022-08-10 NOTE — H&P (Signed)
MRN: [id number] DOB: February 25, 1966 Subjective  Chief Complaint: Post Operative Visit   History of Present Illness: Wendy Webb is a 57 y.o. female who is seen today for right breast cancer. The patient is a 57 year old white female who is about 2 weeks status post right breast lumpectomy and sentinel node biopsy for a [id number] right breast cancer that was triple negative with a Ki-67 of 95%. The original cancer was 2.7 cm. She had a complete pathologic response. She tolerated the surgery well.    Review of Systems: A complete review of systems was obtained from the patient. I have reviewed this information and discussed as appropriate with the patient. See HPI as well for other ROS.  ROS  Medical History: Past Medical History: Diagnosis Date Arrhythmia  Patient Active Problem List Diagnosis Malignant neoplasm of upper-outer quadrant of right breast in female, estrogen receptor negative  Past Surgical History: Procedure Laterality Date CHOLECYSTECTOMY N/A 1994 LAPAROSCOPIC CHOLECYSTECTOMY   Allergies Allergen Reactions Shellfish Containing Products Nausea And Vomiting  Current Outpatient Medications on File Prior to Visit Medication Sig Dispense Refill multivitamin with iron-minerals (SUPER THERA VITE M) tablet Take 1 tablet by mouth multivitamin with minerals tablet Take 1 tablet by mouth once daily  No current facility-administered medications on file prior to visit.  Family History Problem Relation Age of Onset Skin cancer Mother Obesity Mother High blood pressure (Hypertension) Mother Coronary Artery Disease (Blocked arteries around heart) Mother Stroke Maternal Grandmother High blood pressure (Hypertension) Maternal Grandmother   Social History  Tobacco Use Smoking Status Never Smokeless Tobacco Never   Social History  Socioeconomic History Marital status: Married Tobacco Use Smoking status: Never Smokeless tobacco: Never Vaping Use Vaping Use:  Never used Substance and Sexual Activity Alcohol use: Yes Drug use: Never  Objective:  There were no vitals filed for this visit. There is no height or weight on file to calculate BMI.  Physical Exam Vitals reviewed. Constitutional: General: She is not in acute distress. Appearance: Normal appearance. HENT: Head: Normocephalic and atraumatic. Right Ear: External ear normal. Left Ear: External ear normal. Nose: Nose normal. Mouth/Throat: Mouth: Mucous membranes are moist. Pharynx: Oropharynx is clear. Eyes: General: No scleral icterus. Extraocular Movements: Extraocular movements intact. Conjunctiva/sclera: Conjunctivae normal. Pupils: Pupils are equal, round, and reactive to light. Cardiovascular: Rate and Rhythm: Normal rate and regular rhythm. Pulses: Normal pulses. Heart sounds: Normal heart sounds. Pulmonary: Effort: Pulmonary effort is normal. No respiratory distress. Breath sounds: Normal breath sounds. Abdominal: General: Bowel sounds are normal. Palpations: Abdomen is soft. Tenderness: There is no abdominal tenderness. Musculoskeletal: General: No swelling, tenderness or deformity. Normal range of motion. Cervical back: Normal range of motion and neck supple. Skin: General: Skin is warm and dry. Coloration: Skin is not jaundiced. Neurological: General: No focal deficit present. Mental Status: She is alert and oriented to person, place, and time. Psychiatric: Mood and Affect: Mood normal. Behavior: Behavior normal.    Breast: The right breast and axillary incisions are healing nicely with no sign of infection or significant seroma.  Labs, Imaging and Diagnostic Testing:  Assessment and Plan:  Diagnoses and all orders for this visit:  Malignant neoplasm of upper-outer quadrant of right breast in female, estrogen receptor negative    The patient is about 2 weeks status post right breast lumpectomy for breast cancer. She tolerated the surgery  well. She had a complete pathologic response. At this point she will follow-up with medical and radiation oncology for adjuvant therapy. I will plan to see her  back in about 6 months. Plan for port removal today

## 2022-08-10 NOTE — Op Note (Signed)
08/10/2022  10:08 AM  PATIENT:  Wendy Webb  57 y.o. female  PRE-OPERATIVE DIAGNOSIS:  right breast cancer  POST-OPERATIVE DIAGNOSIS:  right breast cancer  PROCEDURE:  Procedure(s): REMOVAL PORT-A-CATH (Left)  SURGEON:  Surgeon(s) and Role:    * Griselda Miner, MD - Primary  PHYSICIAN ASSISTANT:   ASSISTANTS: none   ANESTHESIA:   local and IV sedation  EBL:  5 mL   BLOOD ADMINISTERED:none  DRAINS: none   LOCAL MEDICATIONS USED:  MARCAINE     SPECIMEN:  No Specimen  DISPOSITION OF SPECIMEN:  N/A  COUNTS:  YES  TOURNIQUET:  * No tourniquets in log *  DICTATION: .Dragon Dictation  After informed consent was obtained the patient was brought to the operating room and placed in the supine position on the operating table.  After adequate IV sedation had been given the patient's left chest and neck area were prepped with ChloraPrep, allowed to dry, and draped in usual sterile manner.  An appropriate timeout was performed.  The area around the port was infiltrated with quarter percent Marcaine.  A small transversely oriented incision was made through her previous incision with a 15 blade knife.  The incision was carried through the subcutaneous tissue sharply with a 15 blade knife until the capsule surrounding the port was opened.  The 2 anchoring stitches were divided and removed.  The port was then gently pushed out of its pocket and with gentle traction was removed from the patient without difficulty.  Pressure was held for several minutes until the area was completely hemostatic.  The tubing tract was closed with a figure-of-eight 3-0 Vicryl stitch.  The subcutaneous tissue was closed with interrupted 3-0 Vicryl stitches.  The skin was closed with interrupted 4-0 Monocryl subcuticular stitches.  Dermabond dressings were applied.  The patient tolerated the procedure well.  At the end of the case all needle sponge and instrument counts were correct.  The patient was then awakened  and taken to recovery in stable condition.  PLAN OF CARE: Discharge to home after PACU  PATIENT DISPOSITION:  PACU - hemodynamically stable.   Delay start of Pharmacological VTE agent (>24hrs) due to surgical blood loss or risk of bleeding: not applicable

## 2022-08-10 NOTE — Interval H&P Note (Signed)
History and Physical Interval Note:  08/10/2022 9:27 AM  Wendy Webb  has presented today for surgery, with the diagnosis of right breast cancer.  The various methods of treatment have been discussed with the patient and family. After consideration of risks, benefits and other options for treatment, the patient has consented to  Procedure(s): REMOVAL PORT-A-CATH (N/A) as a surgical intervention.  The patient's history has been reviewed, patient examined, no change in status, stable for surgery.  I have reviewed the patient's chart and labs.  Questions were answered to the patient's satisfaction.     Chevis Pretty III

## 2022-08-10 NOTE — Transfer of Care (Signed)
Immediate Anesthesia Transfer of Care Note  Patient: Wendy Webb  Procedure(s) Performed: REMOVAL PORT-A-CATH (Left: Chest)  Patient Location: PACU  Anesthesia Type:MAC  Level of Consciousness: awake, alert , and oriented  Airway & Oxygen Therapy: Patient Spontanous Breathing  Post-op Assessment: Report given to RN and Post -op Vital signs reviewed and stable  Post vital signs: Reviewed and stable  Last Vitals:  Vitals Value Taken Time  BP 114/69 08/10/22 1015  Temp 36.9 C 08/10/22 1012  Pulse 79 08/10/22 1025  Resp 18 08/10/22 1012  SpO2 100 % 08/10/22 1025  Vitals shown include unvalidated device data.  Last Pain:  Vitals:   08/10/22 1024  TempSrc:   PainSc: 0-No pain      Patients Stated Pain Goal: 1 (08/10/22 0816)  Complications: No notable events documented.

## 2022-08-10 NOTE — Anesthesia Postprocedure Evaluation (Signed)
Anesthesia Post Note  Patient: Wendy Webb  Procedure(s) Performed: REMOVAL PORT-A-CATH (Left: Chest)     Patient location during evaluation: PACU Anesthesia Type: MAC Level of consciousness: awake and alert Pain management: pain level controlled Vital Signs Assessment: post-procedure vital signs reviewed and stable Respiratory status: spontaneous breathing, nonlabored ventilation, respiratory function stable and patient connected to nasal cannula oxygen Cardiovascular status: stable and blood pressure returned to baseline Postop Assessment: no apparent nausea or vomiting Anesthetic complications: no  No notable events documented.  Last Vitals:  Vitals:   08/10/22 1015 08/10/22 1024  BP: 114/69   Pulse: 83 76  Resp:    Temp:    SpO2: 100% 100%    Last Pain:  Vitals:   08/10/22 1024  TempSrc:   PainSc: 0-No pain                 Shelton Silvas

## 2022-08-13 ENCOUNTER — Encounter (HOSPITAL_BASED_OUTPATIENT_CLINIC_OR_DEPARTMENT_OTHER): Payer: Self-pay | Admitting: General Surgery

## 2022-08-21 ENCOUNTER — Ambulatory Visit
Admission: RE | Admit: 2022-08-21 | Discharge: 2022-08-21 | Disposition: A | Discharge status: Home / Self Care | Payer: BC Managed Care – PPO | Source: Ambulatory Visit | Attending: Radiation Oncology | Admitting: Radiation Oncology

## 2022-08-21 NOTE — Progress Notes (Signed)
  Radiation Oncology         [phone] ________________________________  Name: Wendy Webb MRN: [id number]  Date of Service: 08/21/2022  DOB: 08-11-1965  Post Treatment Telephone Note  Diagnosis:  Stage IIB, [id number], grade 3, triple negative invasive ductal carcinoma of the right breast with complete pathologic response to neoadjuvant chemotherapy.     Indication for treatment:  Curative        Radiation treatment dates:   06/11/22-07/10/22 (as documented in provider EOT note)  The patient was available for call today.   Symptoms of fatigue have improved since completing therapy.  Symptoms of skin changes have improved since completing therapy.  The patient was encouraged to avoid sun exposure in the area of prior treatment for up to one year following radiation with either sunscreen or by the style of clothing worn in the sun.  The patient has scheduled follow up with her medical oncologist Dr. Al Pimple for ongoing surveillance, and was encouraged to call if she develops concerns or questions regarding radiation.   This concludes the interview.   Ruel Favors, LPN

## 2022-10-01 ENCOUNTER — Telehealth: Payer: Self-pay | Admitting: Adult Health

## 2022-10-01 ENCOUNTER — Ambulatory Visit: Payer: BC Managed Care – PPO | Attending: General Surgery

## 2022-10-01 VITALS — Wt 158.1 lb

## 2022-10-01 DIAGNOSIS — Z483 Aftercare following surgery for neoplasm: Secondary | ICD-10-CM | POA: Insufficient documentation

## 2022-10-01 NOTE — Telephone Encounter (Signed)
Patient aware of upcoming appointment  

## 2022-10-01 NOTE — Therapy (Signed)
OUTPATIENT PHYSICAL THERAPY SOZO SCREENING NOTE   Patient Name: Wendy Webb MRN: [id number] DOB:11-Oct-1965, 57 y.o., female Today's Date: 10/01/2022  PCP: Sheela Stack REFERRING PROVIDER: Griselda Miner, MD   PT End of Session - 10/01/22 [phone]     Visit Number 8   # unchanged due to screen only   PT Start Time 0854    PT Stop Time 0858    PT Time Calculation (min) 4 min    Activity Tolerance Patient tolerated treatment well    Behavior During Therapy [facility] for tasks assessed/performed             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HCC)    09/26/2021 (right breast biopsy)   Mild mitral regurgitation 06/2019   Palpitations    PONV (postoperative nausea and vomiting)    Past Surgical History:  Procedure Laterality Date   BREAST BIOPSY  05/03/2022   MM RT RADIOACTIVE SEED LOC MAMMO GUIDE 05/03/2022 [facility] MAMMOGRAPHY   BREAST LUMPECTOMY WITH RADIOACTIVE SEED AND SENTINEL LYMPH NODE BIOPSY Right 05/04/2022   Procedure: RIGHT BREAST LUMPECTOMY WITH RADIOACTIVE SEED AND SENTINEL LYMPH NODE BIOPSY;  Surgeon: Griselda Miner, MD;  Location: [facility];  Service: General;  Laterality: Right;   CHOLECYSTECTOMY     CYST REMOVAL TRUNK Right 05/04/2022   Procedure: CYST REMOVAL AXILLA;  Surgeon: Griselda Miner, MD;  Location: [facility];  Service: General;  Laterality: Right;   GALLBLADDER SURGERY     PORT-A-CATH REMOVAL Left 08/10/2022   Procedure: REMOVAL PORT-A-CATH;  Surgeon: Griselda Miner, MD;  Location: [facility];  Service: General;  Laterality: Left;   PORTACATH PLACEMENT Left 10/18/2021   Procedure: PLA CEMENT OF PORT;  Surgeon: Griselda Miner, MD;  Location: [facility] OR;  Service: General;  Laterality: Left;   Patient Active Problem List   Diagnosis Date Noted   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   Bone pain due to G-CSF 02/23/2022   Genetic testing 10/23/2021   Port-A-Cath in place 10/20/2021   Family history of breast cancer 10/04/2021   Malignant neoplasm of upper-outer quadrant of right breast in female, estrogen receptor negative (HCC) 09/28/2021   Hypertension    Mild mitral regurgitation 06/2019   Palpitations 07/26/2014   Mitral regurgitation 07/26/2014    REFERRING DIAG: right breast cancer at risk for lymphedema  THERAPY DIAG:  Aftercare following surgery for neoplasm  PERTINENT HISTORY: Patient was diagnosed on 09/25/2021 with right grade 3 invasive ductal carcinoma breast cancer. It measures 2.5 cm and is located in the upper outer quadrant. It is triple negative with a Ki67 of 95%. Completed chemotherapy AC, carboplatin, Paclitaxel, and keytruda. Rt lumpectomy and SLNB on 05/04/22 with 2 negative nodes removed   PRECAUTIONS: right UE Lymphedema risk, None  SUBJECTIVE: Pt returns for her 3 month L-dex screen.   PAIN:  Are you having pain? No  SOZO SCREENING: Patient was assessed today using the SOZO machine to determine the lymphedema index score. This was compared to her baseline score. It was determined that she is within the recommended range when compared to her baseline and no further action is needed at this time. She will continue SOZO screenings. These are done every 3 months for 2 years post operatively followed by every 6 months for 2 years, and then  annually.   L-DEX FLOWSHEETS - 10/01/22 0800       L-DEX LYMPHEDEMA SCREENING   Measurement Type Unilateral    L-DEX MEASUREMENT EXTREMITY Upper Extremity    POSITION  Standing    DOMINANT SIDE Left    At Risk Side Right    BASELINE SCORE (UNILATERAL) 5.6    L-DEX SCORE (UNILATERAL) 8.9    VALUE CHANGE (UNILAT) 3.3               Hermenia Bers, PTA 10/01/2022, 8:58 AM

## 2022-10-04 ENCOUNTER — Inpatient Hospital Stay: Payer: BC Managed Care – PPO | Admitting: Adult Health

## 2022-10-15 ENCOUNTER — Ambulatory Visit
Admission: RE | Admit: 2022-10-15 | Discharge: 2022-10-15 | Disposition: A | Discharge status: Home / Self Care | Payer: BC Managed Care – PPO | Source: Ambulatory Visit | Attending: Hematology and Oncology | Admitting: Hematology and Oncology

## 2022-10-15 DIAGNOSIS — Z171 Estrogen receptor negative status [ER-]: Secondary | ICD-10-CM

## 2022-10-15 HISTORY — DX: Personal history of antineoplastic chemotherapy: Z92.21

## 2022-10-15 HISTORY — DX: Personal history of irradiation: Z92.3

## 2022-10-22 ENCOUNTER — Encounter: Payer: Self-pay | Admitting: Adult Health

## 2022-10-22 ENCOUNTER — Inpatient Hospital Stay: Payer: BC Managed Care – PPO | Attending: Hematology and Oncology | Admitting: Adult Health

## 2022-10-22 ENCOUNTER — Other Ambulatory Visit: Payer: Self-pay

## 2022-10-22 VITALS — BP 128/79 | HR 76 | Temp 97.9°F | Resp 20 | Wt 159.4 lb

## 2022-10-22 DIAGNOSIS — C50411 Malignant neoplasm of upper-outer quadrant of right female breast: Secondary | ICD-10-CM | POA: Insufficient documentation

## 2022-10-22 DIAGNOSIS — K589 Irritable bowel syndrome without diarrhea: Secondary | ICD-10-CM | POA: Insufficient documentation

## 2022-10-22 DIAGNOSIS — I10 Essential (primary) hypertension: Secondary | ICD-10-CM | POA: Insufficient documentation

## 2022-10-22 DIAGNOSIS — Z923 Personal history of irradiation: Secondary | ICD-10-CM | POA: Diagnosis not present

## 2022-10-22 DIAGNOSIS — Z803 Family history of malignant neoplasm of breast: Secondary | ICD-10-CM | POA: Insufficient documentation

## 2022-10-22 DIAGNOSIS — Z171 Estrogen receptor negative status [ER-]: Secondary | ICD-10-CM | POA: Diagnosis not present

## 2022-10-22 DIAGNOSIS — Z79899 Other long term (current) drug therapy: Secondary | ICD-10-CM | POA: Insufficient documentation

## 2022-10-22 DIAGNOSIS — Z9221 Personal history of antineoplastic chemotherapy: Secondary | ICD-10-CM | POA: Insufficient documentation

## 2022-10-22 NOTE — Progress Notes (Signed)
SURVIVORSHIP VISIT:  BRIEF ONCOLOGIC HISTORY:  Oncology History  Malignant neoplasm of upper-outer quadrant of right breast in female, estrogen receptor negative (HCC)  09/25/2021 Imaging   Bilateral screening mammogram with irregular hypoechoic mass in the right breast at 12:00 axis, 5 cm from nipple measuring 2.5 cm corresponding to the palpable area of concern. Ultrasound confirmed irregular hypoechoic mass in the right breast at the 12:00 5 cm from the nipple measuring 2.5 cm corresponding to the palpable area of concern.   09/26/2021 Pathology Results   Right breast needle core biopsy showed invasive ductal carcinoma, high-grade prognostic showed ER 0% negative PR 0% negative Ki-67 of 95% and HER2 negative   09/28/2021 Initial Diagnosis   Malignant neoplasm of upper-outer quadrant of right breast in female, estrogen receptor negative (HCC)   10/16/2021 Genetic Testing   Negative hereditary cancer genetic testing: no pathogenic variants detected in [facility] CustomNext-Cancer +RNAinsight Panel.  Report date is October 16, 2021.   The CustomNext-Cancer+RNAinsight panel offered by [facility] includes sequencing and rearrangement analysis for the following 47 genes:  APC, ATM, AXIN2, BARD1, BMPR1A, BRCA1, BRCA2, BRIP1, CDH1, CDK4, CDKN2A, CHEK2, DICER1, EPCAM, GREM1, HOXB13, MEN1, MLH1, MSH2, MSH3, MSH6, MUTYH, NBN, NF1, NF2, NTHL1, PALB2, PMS2, POLD1, POLE, PTEN, RAD51C, RAD51D, RECQL, RET, SDHA, SDHAF2, SDHB, SDHC, SDHD, SMAD4, SMARCA4, STK11, TP53, TSC1, TSC2, and VHL.  RNA data is routinely analyzed for use in variant interpretation for all genes.    10/20/2021 - 12/02/2021 Chemotherapy   Patient is on Treatment Plan : BREAST  Pembrolizumab (200) D1 + AC D1 q21d x 4 cycles / Pembrolizumab (200) D1 + Carboplatin (1.5) D1,8,15 + Paclitaxel (80) D1,8,15 q21d X 4 cycles     10/20/2021 - 03/16/2022 Chemotherapy   Patient is on Treatment Plan : BREAST Pembrolizumab (200) D1 + AC D1 q21d x 4 cycles /  Pembrolizumab (200) D1 + Carboplatin (1.5) D1,8,15 + Paclitaxel (80) D1,8,15 q21d X 4 cycles     05/04/2022 Surgery   Right lumpectomy: no residual carcinoma identified, 2 SLN negative for cancer.   06/11/2022 - 07/10/2022 Radiation Therapy    The patient initially received a dose of 42.56 Gy in 16 fractions to the breast using whole-breast tangent fields. This was delivered using a 3-D conformal technique. The patient then received a boost to the seroma. This delivered an additional 8 Gy in 70fractions using a 3 field photon technique due to the depth of the seroma. The total dose was 50.56 Gy.       INTERVAL HISTORY:  Wendy Webb to review her survivorship care plan detailing her treatment course for breast cancer, as well as monitoring long-term side effects of that treatment, education regarding health maintenance, screening, and overall wellness and health promotion.     Overall, Wendy Webb reports feeling quite well.  She is healing well since completing radiation in April.  She has some tightness under her arm and notes she isn't exercising like she previously did.  She also notes one episode of a small amount of vaginal spotting after not having a menstrual cycle for three years.  She has not had any further issues. Otherwise she is feeling well.  REVIEW OF SYSTEMS:  Review of Systems  Constitutional:  Negative for appetite change, chills, fatigue, fever and unexpected weight change.  HENT:   Negative for hearing loss, lump/mass and trouble swallowing.   Eyes:  Negative for eye problems and icterus.  Respiratory:  Negative for chest tightness, cough and shortness of breath.  Cardiovascular:  Negative for chest pain, leg swelling and palpitations.  Gastrointestinal:  Negative for abdominal distention, abdominal pain, constipation, diarrhea, nausea and vomiting.  Endocrine: Negative for hot flashes.  Genitourinary:  Negative for difficulty urinating.   Musculoskeletal:  Negative for  arthralgias.  Skin:  Negative for itching and rash.  Neurological:  Negative for dizziness, extremity weakness, headaches and numbness.  Hematological:  Negative for adenopathy. Does not bruise/bleed easily.  Psychiatric/Behavioral:  Negative for depression. The patient is not nervous/anxious.    Breast: Denies any new nodularity, masses, tenderness, nipple changes, or nipple discharge.       PAST MEDICAL/SURGICAL HISTORY:  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HCC)    09/26/2021 (right breast biopsy)   Mild mitral regurgitation 06/2019   Palpitations    Personal history of chemotherapy    Personal history of radiation therapy    PONV (postoperative nausea and vomiting)    Port-A-Cath in place 10/20/2021   Past Surgical History:  Procedure Laterality Date   BREAST BIOPSY  05/03/2022   MM RT RADIOACTIVE SEED LOC MAMMO GUIDE 05/03/2022 [facility] MAMMOGRAPHY   BREAST BIOPSY Right 10/20/2021   BREAST LUMPECTOMY Right 05/04/2022   BREAST LUMPECTOMY WITH RADIOACTIVE SEED AND SENTINEL LYMPH NODE BIOPSY Right 05/04/2022   Procedure: RIGHT BREAST LUMPECTOMY WITH RADIOACTIVE SEED AND SENTINEL LYMPH NODE BIOPSY;  Surgeon: Griselda Miner, MD;  Location: [facility];  Service: General;  Laterality: Right;   CHOLECYSTECTOMY     CYST REMOVAL TRUNK Right 05/04/2022   Procedure: CYST REMOVAL AXILLA;  Surgeon: Griselda Miner, MD;  Location: [facility];  Service: General;  Laterality: Right;   GALLBLADDER SURGERY     PORT-A-CATH REMOVAL Left 08/10/2022   Procedure: REMOVAL PORT-A-CATH;  Surgeon: Griselda Miner, MD;  Location: [facility];  Service: General;  Laterality: Left;   PORTACATH PLACEMENT Left 10/18/2021   Procedure: PLA CEMENT OF PORT;  Surgeon: Griselda Miner, MD;  Location: [facility];  Service: General;  Laterality:  Left;     ALLERGIES:  Allergies  Allergen Reactions   Ace Inhibitors     Cough  Patient states Lisinopril   Shellfish Allergy Nausea And Vomiting     CURRENT MEDICATIONS:  Outpatient Encounter Medications as of 10/22/2022  Medication Sig   cholecalciferol (VITAMIN D3) 25 MCG (1000 UNIT) tablet Take 1,000 Units by mouth daily.   metoprolol tartrate (LOPRESSOR) 25 MG tablet Take 12.5 mg by mouth every other day.   Misc Natural Products (TART CHERRY ADVANCED PO) Take 1 tablet by mouth daily.   Multiple Vitamins-Minerals (MULTIVIT/MULTIMINERAL ADULT) LIQD Take 30 mLs by mouth daily.   vitamin B-12 (CYANOCOBALAMIN) 100 MCG tablet Take 100 mcg by mouth daily.   [DISCONTINUED] lidocaine-prilocaine (EMLA) cream Apply to affected area once   [DISCONTINUED] oxyCODONE (ROXICODONE) 5 MG immediate release tablet Take 1 tablet (5 mg total) by mouth every 6 (six) hours as needed for severe pain.   No facility-administered encounter medications on file as of 10/22/2022.     ONCOLOGIC FAMILY HISTORY:  Family History  Problem Relation Age of Onset   Hypertension Mother    Heart attack Mother    Heart disease Mother        Atrial fibrillation   Obstructive Sleep Apnea Mother    Hypertension Brother    Stroke  Maternal Grandmother 11   Breast cancer Other 69       PGM's sister   Colon cancer Neg Hx    Esophageal cancer Neg Hx      SOCIAL HISTORY:  Social History   Socioeconomic History   Marital status: Married    Spouse name: Not on file   Number of children: 2   Years of education: Not on file   Highest education level: Not on file  Occupational History    Comment: Hair dresser  Tobacco Use   Smoking status: Never   Smokeless tobacco: Never  Vaping Use   Vaping status: Never Used  Substance and Sexual Activity   Alcohol use: Yes    Comment: socially   Drug use: No   Sexual activity: Yes    Partners: Male  Other Topics Concern   Not on file  Social History Narrative    Pt is a hair stylist   Married (3rd marriage)   2 sons (both grown)   Enjoys yard work, hiking, wineries   Complete 2 year cosmetology   Has one Medical laboratory scientific officer   Social Determinants of Corporate investment banker Strain: Not on Ship broker Insecurity: Not on file  Transportation Needs: Not on file  Physical Activity: Not on file  Stress: Not on file  Social Connections: Not on file  Intimate Partner Violence: Not on file     OBSERVATIONS/OBJECTIVE:  BP 128/79 (BP Location: Left Arm, Patient Position: Sitting)   Pulse 76   Temp 97.9 F (36.6 C) (Temporal)   Resp 20   Wt 159 lb 6.4 oz (72.3 kg)   LMP 05/14/2019   SpO2 98%   BMI 24.97 kg/m  GENERAL: Patient is a well appearing female in no acute distress HEENT:  Sclerae anicteric.  Oropharynx clear and moist. No ulcerations or evidence of oropharyngeal candidiasis. Neck is supple.  NODES:  No cervical, supraclavicular, or axillary lymphadenopathy palpated.  BREAST EXAM: Right breast status postlumpectomy and radiation no sign of local recurrence left breast is benign. LUNGS:  Clear to auscultation bilaterally.  No wheezes or rhonchi. HEART:  Regular rate and rhythm. No murmur appreciated. ABDOMEN:  Soft, nontender.  Positive, normoactive bowel sounds. No organomegaly palpated. MSK:  No focal spinal tenderness to palpation. Full range of motion bilaterally in the upper extremities. EXTREMITIES:  No peripheral edema.   SKIN:  Clear with no obvious rashes or skin changes. No nail dyscrasia. NEURO:  Nonfocal. Well oriented.  Appropriate affect.   LABORATORY DATA:  None for this visit.  DIAGNOSTIC IMAGING:  None for this visit.      ASSESSMENT AND PLAN:  Ms.. Wendy Webb is a pleasant 57 y.o. female with Stage IIB right breast invasive ductal carcinoma, ER-/PR-/HER2-, diagnosed in 09/2021, treated with neoadjuvant chemotherapy, lumpectomy and adjuvant radiation therapy.  She presents to the Survivorship Clinic for our initial meeting and  routine follow-up post-completion of treatment for breast cancer.    1. Stage IIB right breast cancer:  Ms. Bark is continuing to recover from definitive treatment for breast cancer. She will follow-up with her medical oncologist, Dr.  Al Pimple in 05/2023 with history and physical exam per surveillance protocol.   Her mammogram is due 10/2023; orders placed today.   Today, a comprehensive survivorship care plan and treatment summary was reviewed with the patient today detailing her breast cancer diagnosis, treatment course, potential late/long-term effects of treatment, appropriate follow-up care with recommendations for the future, and patient education resources.  A  copy of this summary, along with a letter will be sent to the patient's primary care provider via mail/fax/In Basket message after today's visit.    2. Postmenopausal vaginal bleeding: I recommended f/u with gynecology to rule out endometrial polyps, and less likely endometrial cancer.  This can rarely be associated with the treatments she has gone through and hormonal changes in her body.    3. Right breast fullness/tightness: This is likely secondary to not exercising her shoulders like she previously did.  She is going to start her exercises again and will continue massaging her breast with oil.  If it doesn't improve she will let me know and I will order PT referral.    4. Bone health:  She was given education on specific activities to promote bone health.  5. Cancer screening:  Due to Ms. Mccue's history and her age, she should receive screening for skin cancers, colon cancer, and gynecologic cancers.  The information and recommendations are listed on the patient's comprehensive care plan/treatment summary and were reviewed in detail with the patient.    6. Health maintenance and wellness promotion: Ms. Bayer was encouraged to consume 5-7 servings of fruits and vegetables per day. We reviewed the "Nutrition Rainbow" handout.  She was also  encouraged to engage in moderate to vigorous exercise for 30 minutes per day most days of the week.  She was instructed to limit her alcohol consumption and continue to abstain from tobacco use.     7. Support services/counseling: It is not uncommon for this period of the patient's cancer care trajectory to be one of many emotions and stressors.   She was given information regarding our available services and encouraged to contact me with any questions or for help enrolling in any of our support group/programs.    Follow up instructions:    -Return to cancer center in 05/2023 for f/u with Dr. Al Pimple  -Mammogram due in 10/2023 -She is welcome to return back to the Survivorship Clinic at any time; no additional follow-up needed at this time.  -Consider referral back to survivorship as a long-term survivor for continued surveillance  The patient was provided an opportunity to ask questions and all were answered. The patient agreed with the plan and demonstrated an understanding of the instructions.   Total encounter time:40 minutes*in face-to-face visit time, chart review, lab review, care coordination, order entry, and documentation of the encounter time.    Lillard Anes, NP 10/22/22 3:42 PM Medical Oncology and Hematology [facility] [address] [place], [place] [address] Tel. [phone]    Fax. [phone]  *Total Encounter Time as defined by the Centers for Medicare and Medicaid Services includes, in addition to the face-to-face time of a patient visit (documented in the note above) non-face-to-face time: obtaining and reviewing outside history, ordering and reviewing medications, tests or procedures, care coordination (communications with other health care professionals or caregivers) and documentation in the medical record.

## 2022-12-21 LAB — COLOGUARD: COLOGUARD: NEGATIVE

## 2022-12-27 ENCOUNTER — Telehealth: Payer: Self-pay

## 2022-12-27 NOTE — Telephone Encounter (Signed)
Patient called regarding a transabdominal/transvaginal ultrasound report received from [facility]. Upon discussion with [facility] (facility that ordered the [place]) and Dr. Al Pimple, patient informed that it is suggested by Dr. Al Pimple to see a GYN provider regarding the [place] results for a biopsy. This RN stated that she could see a GYN provider of her preference if she has one or we could refer her to an office. She stated that she used to go to [facility] in [place] and liked it. This RN stated that if she would like to attend that office, to please call and make an appointment.    This RN also suggested that she could request her information to be sent over to the GYN office from [facility] so that they have her records once she makes an appointment. Pt verbalized understanding and stated that she will call and make a GYN appointment at [facility] and will request her information from [facility] to be sent over to her GYN office.

## 2022-12-28 ENCOUNTER — Encounter: Payer: Self-pay | Admitting: Physician Assistant

## 2022-12-31 ENCOUNTER — Ambulatory Visit: Payer: BC Managed Care – PPO | Attending: General Surgery

## 2022-12-31 VITALS — Wt 160.0 lb

## 2022-12-31 DIAGNOSIS — Z483 Aftercare following surgery for neoplasm: Secondary | ICD-10-CM | POA: Insufficient documentation

## 2022-12-31 NOTE — Therapy (Signed)
OUTPATIENT PHYSICAL THERAPY SOZO SCREENING NOTE   Patient Name: Wendy Webb MRN: [id number] DOB:[birth year], 57 y.o., female Today's Date: 12/31/2022  PCP: Royann Shivers, PA-C REFERRING PROVIDER: Griselda Miner, MD   PT End of Session - 12/31/22 1031     Visit Number 8   # unchanged due to screen only   PT Start Time 1029    PT Stop Time 1033    PT Time Calculation (min) 4 min    Activity Tolerance Patient tolerated treatment well    Behavior During Therapy [facility] for tasks assessed/performed             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HCC)    09/26/2021 (right breast biopsy)   Mild mitral regurgitation 06/2019   Palpitations    Personal history of chemotherapy    Personal history of radiation therapy    PONV (postoperative nausea and vomiting)    Port-A-Cath in place 10/20/2021   Past Surgical History:  Procedure Laterality Date   BREAST BIOPSY  05/03/2022   MM RT RADIOACTIVE SEED LOC MAMMO GUIDE 05/03/2022 [facility] MAMMOGRAPHY   BREAST BIOPSY Right 10/20/2021   BREAST LUMPECTOMY Right 05/04/2022   BREAST LUMPECTOMY WITH RADIOACTIVE SEED AND SENTINEL LYMPH NODE BIOPSY Right 05/04/2022   Procedure: RIGHT BREAST LUMPECTOMY WITH RADIOACTIVE SEED AND SENTINEL LYMPH NODE BIOPSY;  Surgeon: Griselda Miner, MD;  Location: [facility];  Service: General;  Laterality: Right;   CHOLECYSTECTOMY     CYST REMOVAL TRUNK Right 05/04/2022   Procedure: CYST REMOVAL AXILLA;  Surgeon: Griselda Miner, MD;  Location: [facility];  Service: General;  Laterality: Right;   GALLBLADDER SURGERY     PORT-A-CATH REMOVAL Left 08/10/2022   Procedure: REMOVAL PORT-A-CATH;  Surgeon: Griselda Miner, MD;  Location: [facility];  Service: General;  Laterality: Left;   PORTACATH PLACEMENT Left 10/18/2021   Procedure:  PLA CEMENT OF PORT;  Surgeon: Griselda Miner, MD;  Location: [facility];  Service: General;  Laterality: Left;   Patient Active Problem List   Diagnosis Date Noted   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 It measures 2.5 cm and is located in the upper outer quadrant. It is triple negative with a Ki67 of 95%. Completed chemotherapy AC, carboplatin, Paclitaxel, and keytruda. Rt lumpectomy and SLNB on 05/04/22 with 2 negative nodes removed   PRECAUTIONS: right UE Lymphedema risk, None  SUBJECTIVE: Pt returns for her 3 month L-dex screen.   PAIN:  Are you having pain? No  SOZO SCREENING: Patient was assessed today using the SOZO machine to determine the lymphedema index score. This was compared to her baseline score. It was determined that she is within the recommended range when compared to her baseline and no further action is needed at this time.  She will continue SOZO screenings. These are done every 3 months for 2 years post operatively followed by every 6 months for 2 years, and then annually.   L-DEX FLOWSHEETS - 12/31/22 1000       L-DEX LYMPHEDEMA SCREENING   Measurement Type Unilateral    L-DEX MEASUREMENT EXTREMITY Upper Extremity    POSITION  Standing    DOMINANT SIDE Left    At Risk Side Right    BASELINE SCORE (UNILATERAL) 5.6    L-DEX SCORE (UNILATERAL) 7.2    VALUE CHANGE (UNILAT) 1.6                Wendy Webb, PTA 12/31/2022, 10:33 AM

## 2023-02-25 IMAGING — MG MM BREAST LOCALIZATION CLIP
4 series · 4 of 12 positions shown · non-contrast
Comparison: Previous exam(s).

CLINICAL DATA: Status post ultrasound-guided biopsy right breast
mass.

EXAM:
3D DIAGNOSTIC RIGHT MAMMOGRAM POST ULTRASOUND BIOPSY

[R ML synth-2D]
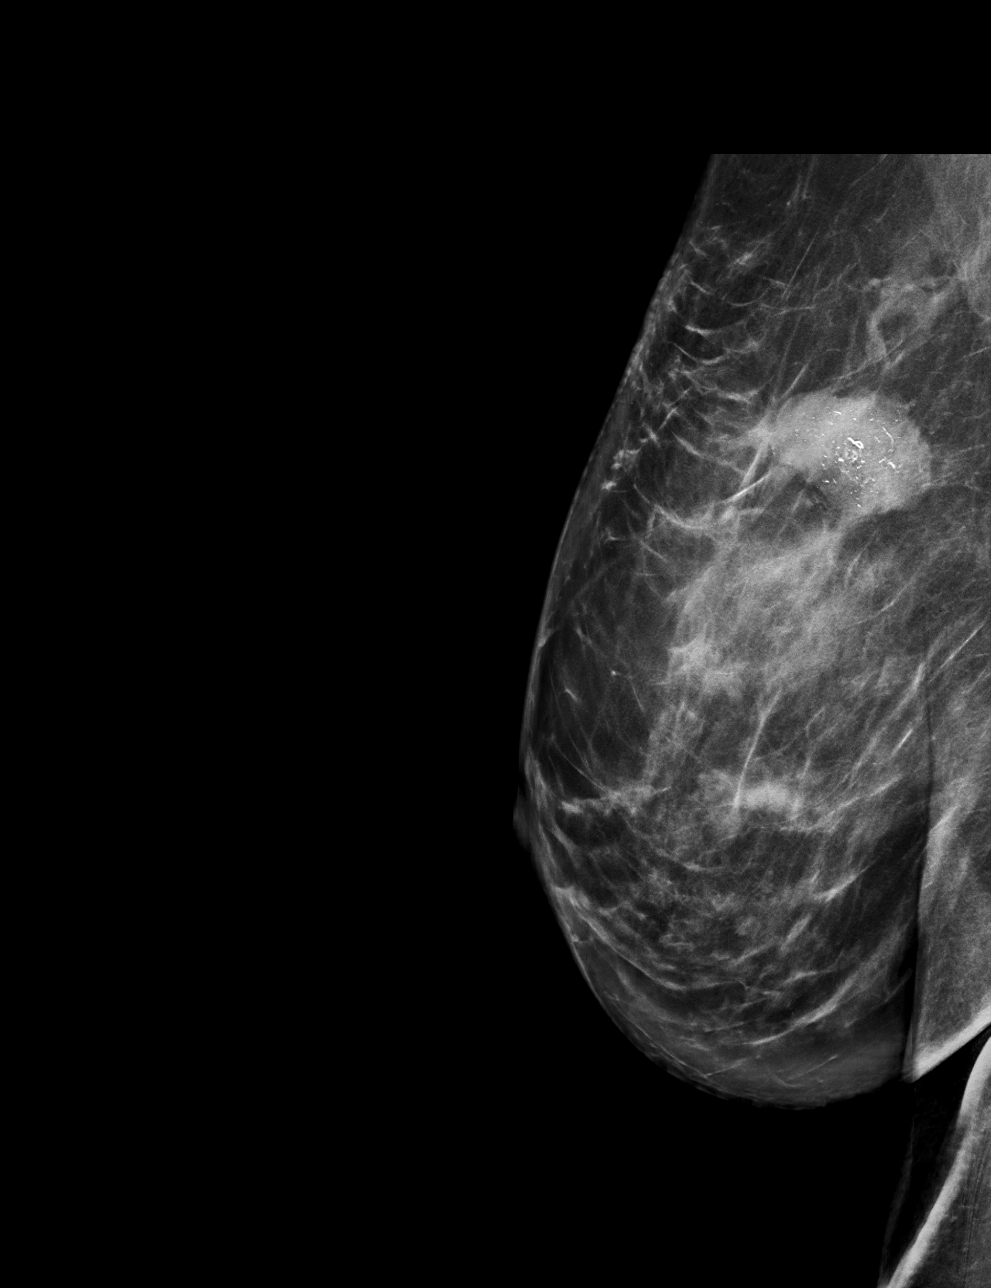

[R CC synth-2D]
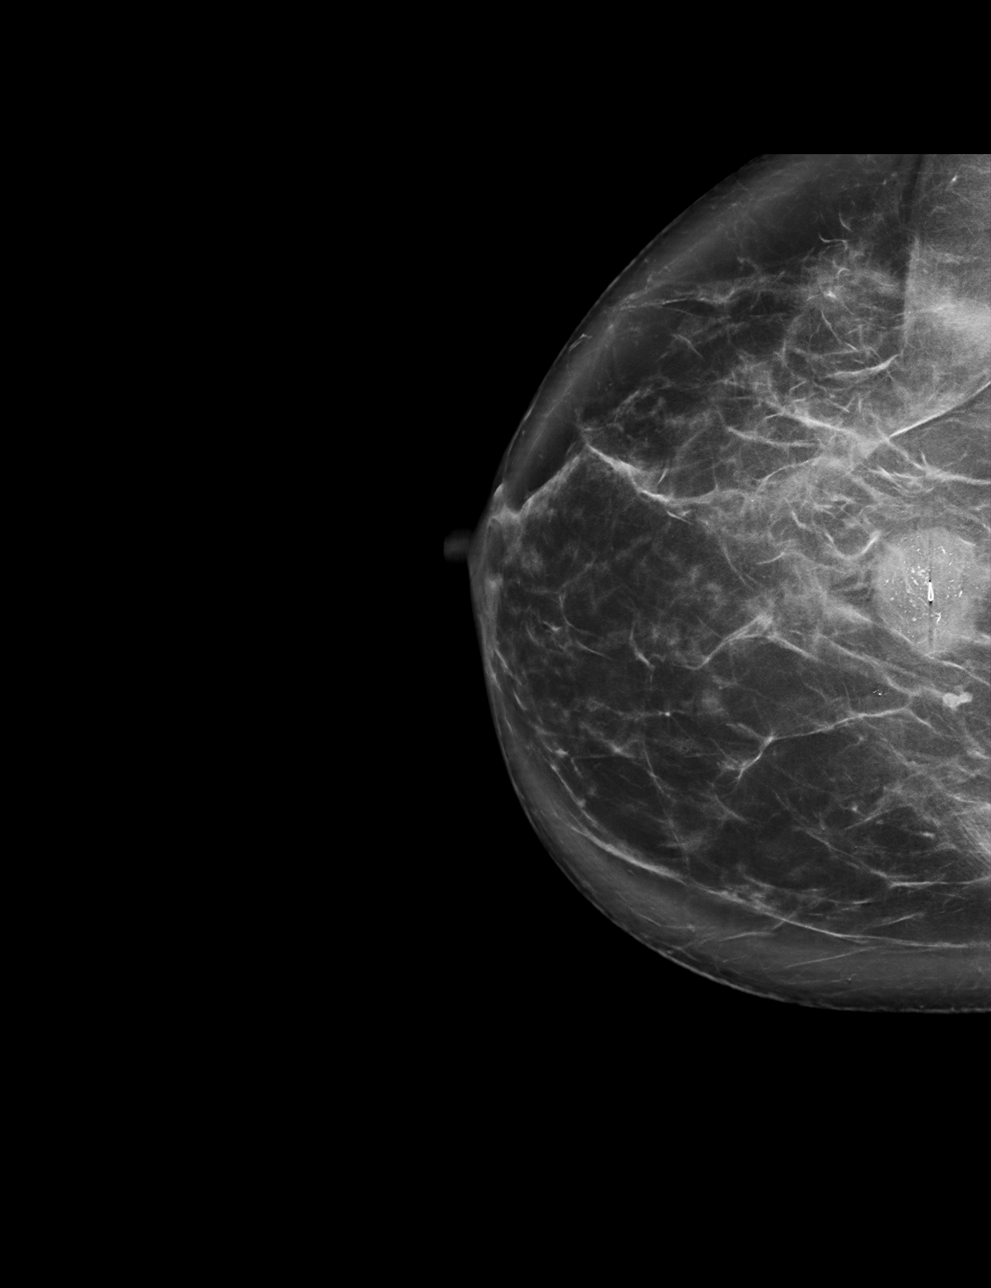

[R CC tomo · tomo slice 41/80.0]
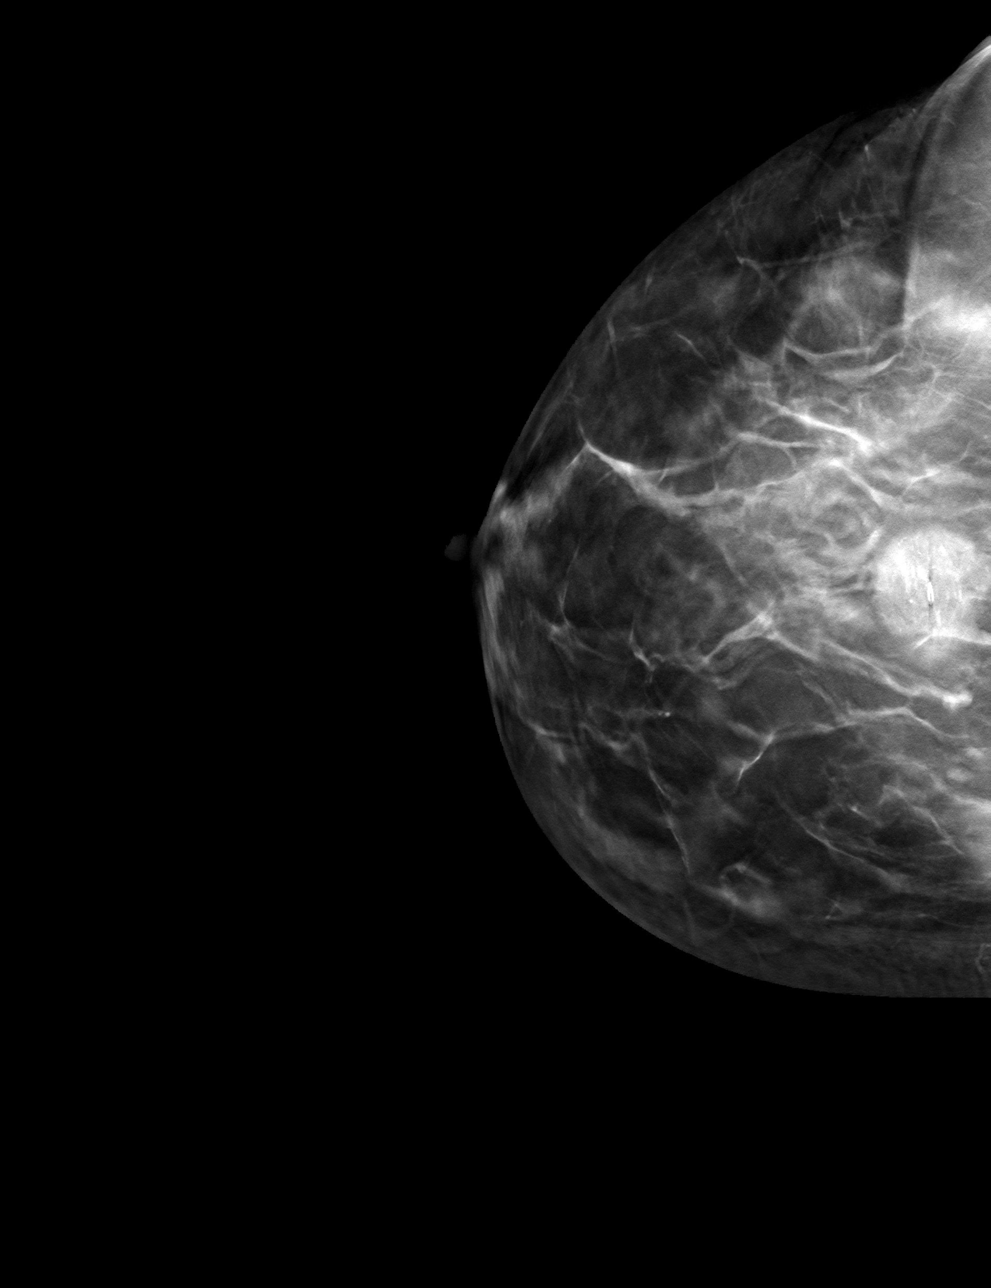

[R ML tomo · tomo slice 43/84.0]
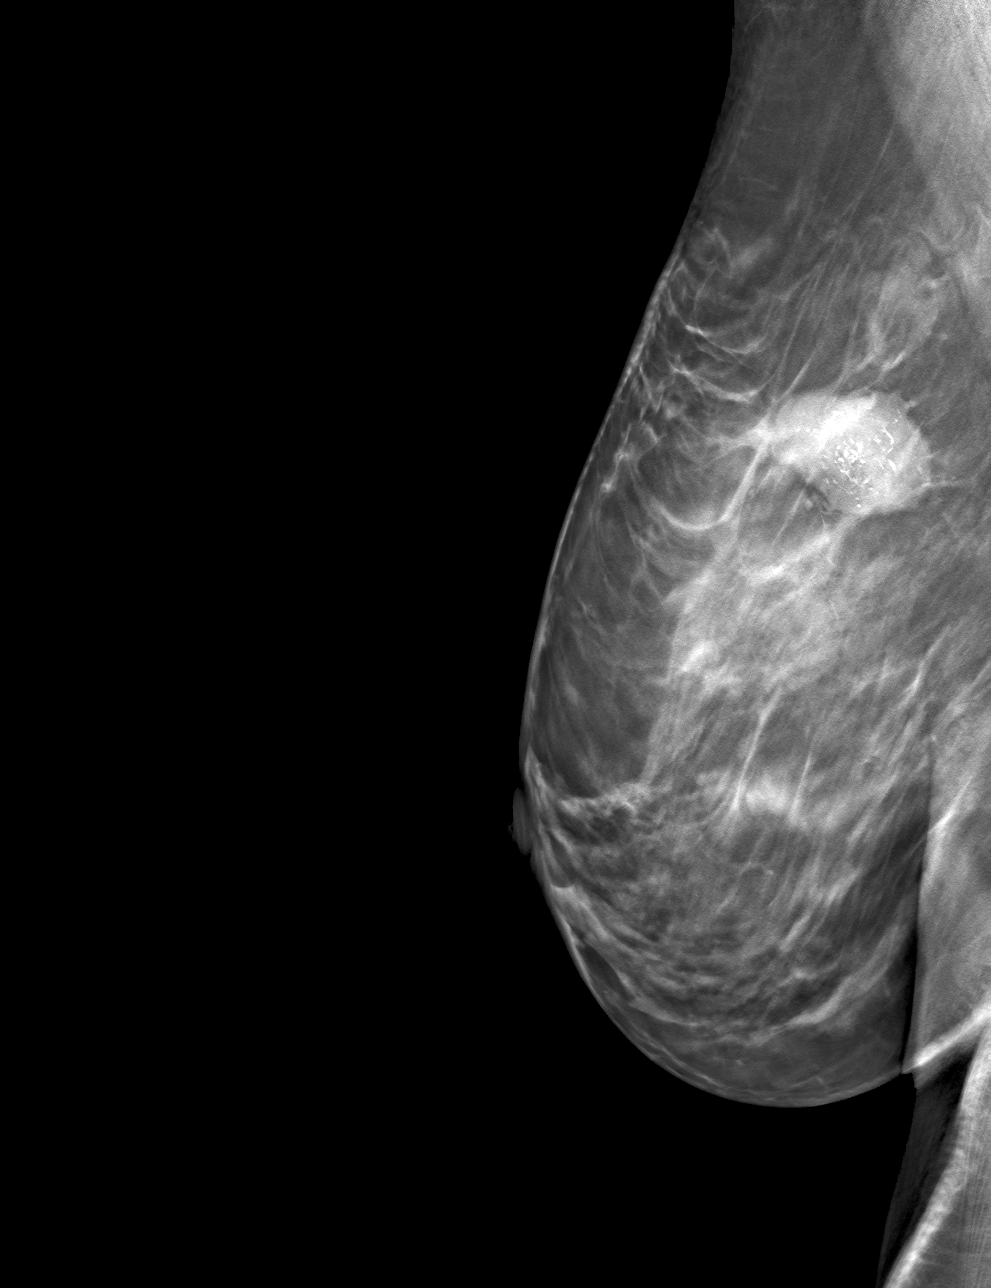

[4 of 12 positions shown; findings below may reference images not displayed]

FINDINGS: 3D Mammographic images were obtained following ultrasound guided
biopsy of right breast mass. The biopsy marking clip is in expected
position at the site of biopsy.
IMPRESSION: Appropriate positioning of the ribbon shaped biopsy marking clip at
the site of biopsy in the right breast.

Final Assessment: Post Procedure Mammograms for Marker Placement

## 2023-04-01 ENCOUNTER — Ambulatory Visit: Payer: BC Managed Care – PPO | Attending: General Surgery

## 2023-04-01 VITALS — Wt 161.1 lb

## 2023-04-01 DIAGNOSIS — Z483 Aftercare following surgery for neoplasm: Secondary | ICD-10-CM | POA: Insufficient documentation

## 2023-04-01 NOTE — Therapy (Signed)
OUTPATIENT PHYSICAL THERAPY SOZO SCREENING NOTE   Patient Name: Wendy Webb MRN: [id number] DOB:1966-03-31, 57 y.o., female Today's Date: 04/01/2023  PCP: Royann Shivers, PA-C REFERRING PROVIDER: Griselda Miner, MD   PT End of Session - 04/01/23 [phone]     Visit Number 8   # unchanged due to screen only   PT Start Time 0937    PT Stop Time 0941    PT Time Calculation (min) 4 min    Activity Tolerance Patient tolerated treatment well    Behavior During Therapy [facility] for tasks assessed/performed             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HCC)    09/26/2021 (right breast biopsy)   Mild mitral regurgitation 06/2019   Palpitations    Personal history of chemotherapy    Personal history of radiation therapy    PONV (postoperative nausea and vomiting)    Port-A-Cath in place 10/20/2021   Past Surgical History:  Procedure Laterality Date   BREAST BIOPSY  05/03/2022   MM RT RADIOACTIVE SEED LOC MAMMO GUIDE 05/03/2022 [facility] MAMMOGRAPHY   BREAST BIOPSY Right 10/20/2021   BREAST LUMPECTOMY Right 05/04/2022   BREAST LUMPECTOMY WITH RADIOACTIVE SEED AND SENTINEL LYMPH NODE BIOPSY Right 05/04/2022   Procedure: RIGHT BREAST LUMPECTOMY WITH RADIOACTIVE SEED AND SENTINEL LYMPH NODE BIOPSY;  Surgeon: Griselda Miner, MD;  Location: [facility];  Service: General;  Laterality: Right;   CHOLECYSTECTOMY     CYST REMOVAL TRUNK Right 05/04/2022   Procedure: CYST REMOVAL AXILLA;  Surgeon: Griselda Miner, MD;  Location: [facility];  Service: General;  Laterality: Right;   GALLBLADDER SURGERY     PORT-A-CATH REMOVAL Left 08/10/2022   Procedure: REMOVAL PORT-A-CATH;  Surgeon: Griselda Miner, MD;  Location: [facility];  Service: General;  Laterality: Left;   PORTACATH PLACEMENT Left 10/18/2021   Procedure:  PLA CEMENT OF PORT;  Surgeon: Griselda Miner, MD;  Location: [facility];  Service: General;  Laterality: Left;   Patient Active Problem List   Diagnosis Date Noted   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 It measures 2.5 cm and is located in the upper outer quadrant. It is triple negative with a Ki67 of 95%. Completed chemotherapy AC, carboplatin, Paclitaxel, and keytruda. Rt lumpectomy and SLNB on 05/04/22 with 2 negative nodes removed   PRECAUTIONS: right UE Lymphedema risk, None  SUBJECTIVE: Pt returns for her 3 month L-dex screen.   PAIN:  Are you having pain? No  SOZO SCREENING: Patient was assessed today using the SOZO machine to determine the lymphedema index score. This was compared to her baseline score. It was determined that she is within the recommended range when compared to her baseline and no further action is needed at this time.  She will continue SOZO screenings. These are done every 3 months for 2 years post operatively followed by every 6 months for 2 years, and then annually.   L-DEX FLOWSHEETS - 04/01/23 0900       L-DEX LYMPHEDEMA SCREENING   Measurement Type Unilateral    L-DEX MEASUREMENT EXTREMITY Upper Extremity    POSITION  Standing    DOMINANT SIDE Left    At Risk Side Right    BASELINE SCORE (UNILATERAL) 5.6    L-DEX SCORE (UNILATERAL) 11    VALUE CHANGE (UNILAT) 5.4                Hermenia Bers, PTA 04/01/2023, 9:40 AM

## 2023-05-17 ENCOUNTER — Telehealth: Payer: Self-pay

## 2023-05-17 NOTE — Telephone Encounter (Signed)
 Spoke with patient and confirmed her appointment with Dr Arno Bibles on 05/20/23.  Advised to try and arrive 15 mins early.  Patient confirmed and understood.

## 2023-05-20 ENCOUNTER — Inpatient Hospital Stay: Payer: BC Managed Care – PPO | Attending: Hematology and Oncology | Admitting: Hematology and Oncology

## 2023-05-20 VITALS — BP 142/67 | HR 67 | Temp 98.0°F | Resp 18 | Wt 166.1 lb

## 2023-05-20 DIAGNOSIS — Z9221 Personal history of antineoplastic chemotherapy: Secondary | ICD-10-CM | POA: Diagnosis not present

## 2023-05-20 DIAGNOSIS — C50411 Malignant neoplasm of upper-outer quadrant of right female breast: Secondary | ICD-10-CM | POA: Diagnosis not present

## 2023-05-20 DIAGNOSIS — Z171 Estrogen receptor negative status [ER-]: Secondary | ICD-10-CM | POA: Insufficient documentation

## 2023-05-20 DIAGNOSIS — I10 Essential (primary) hypertension: Secondary | ICD-10-CM | POA: Diagnosis not present

## 2023-05-20 DIAGNOSIS — G629 Polyneuropathy, unspecified: Secondary | ICD-10-CM | POA: Diagnosis not present

## 2023-05-20 DIAGNOSIS — Z923 Personal history of irradiation: Secondary | ICD-10-CM | POA: Diagnosis not present

## 2023-05-20 DIAGNOSIS — Z79899 Other long term (current) drug therapy: Secondary | ICD-10-CM | POA: Insufficient documentation

## 2023-05-20 DIAGNOSIS — Z803 Family history of malignant neoplasm of breast: Secondary | ICD-10-CM | POA: Insufficient documentation

## 2023-05-20 DIAGNOSIS — I890 Lymphedema, not elsewhere classified: Secondary | ICD-10-CM | POA: Insufficient documentation

## 2023-05-20 DIAGNOSIS — K589 Irritable bowel syndrome without diarrhea: Secondary | ICD-10-CM | POA: Diagnosis not present

## 2023-05-20 DIAGNOSIS — R519 Headache, unspecified: Secondary | ICD-10-CM | POA: Insufficient documentation

## 2023-05-20 NOTE — Progress Notes (Signed)
 BRIEF ONCOLOGIC HISTORY:  Oncology History  Malignant neoplasm of upper-outer quadrant of right breast in female, estrogen receptor negative (HCC)  09/25/2021 Imaging   Bilateral screening mammogram with irregular hypoechoic mass in the right breast at 12:00 axis, 5 cm from nipple measuring 2.5 cm corresponding to the palpable area of concern. Ultrasound confirmed irregular hypoechoic mass in the right breast at the 12:00 5 cm from the nipple measuring 2.5 cm corresponding to the palpable area of concern.   09/26/2021 Pathology Results   Right breast needle core biopsy showed invasive ductal carcinoma, high-grade prognostic showed ER 0% negative PR 0% negative Ki-67 of 95% and HER2 negative   09/28/2021 Initial Diagnosis   Malignant neoplasm of upper-outer quadrant of right breast in female, estrogen receptor negative (HCC)   10/16/2021 Genetic Testing   Negative hereditary cancer genetic testing: no pathogenic variants detected in [facility] CustomNext-Cancer +RNAinsight Panel.  Report date is October 16, 2021.   The CustomNext-Cancer+RNAinsight panel offered by [facility] includes sequencing and rearrangement analysis for the following 47 genes:  APC, ATM, AXIN2, BARD1, BMPR1A, BRCA1, BRCA2, BRIP1, CDH1, CDK4, CDKN2A, CHEK2, DICER1, EPCAM, GREM1, HOXB13, MEN1, MLH1, MSH2, MSH3, MSH6, MUTYH, NBN, NF1, NF2, NTHL1, PALB2, PMS2, POLD1, POLE, PTEN, RAD51C, RAD51D, RECQL, RET, SDHA, SDHAF2, SDHB, SDHC, SDHD, SMAD4, SMARCA4, STK11, TP53, TSC1, TSC2, and VHL.  RNA data is routinely analyzed for use in variant interpretation for all genes.    10/20/2021 - 12/02/2021 Chemotherapy   Patient is on Treatment Plan : BREAST  Pembrolizumab  (200) D1 + AC D1 q21d x 4 cycles / Pembrolizumab  (200) D1 + Carboplatin  (1.5) D1,8,15 + Paclitaxel  (80) D1,8,15 q21d X 4 cycles     10/20/2021 - 03/16/2022 Chemotherapy   Patient is on Treatment Plan : BREAST Pembrolizumab  (200) D1 + AC D1 q21d x 4 cycles / Pembrolizumab  (200) D1 +  Carboplatin  (1.5) D1,8,15 + Paclitaxel  (80) D1,8,15 q21d X 4 cycles     05/04/2022 Surgery   Right lumpectomy: no residual carcinoma identified, 2 SLN negative for cancer.   06/11/2022 - 07/10/2022 Radiation Therapy    The patient initially received a dose of 42.56 Gy in 16 fractions to the breast using whole-breast tangent fields. This was delivered using a 3-D conformal technique. The patient then received a boost to the seroma. This delivered an additional 8 Gy in 88fractions using a 3 field photon technique due to the depth of the seroma. The total dose was 50.56 Gy.       INTERVAL HISTORY:  Discussed the use of AI scribe software for clinical note transcription with the patient, who gave verbal consent to proceed.  History of Present Illness    Wendy Webb is a 58 year old female who presents for follow-up regarding residual side effects and general health.  She experiences ongoing numbness in the toes of one foot, which she attributes to residual effects from chemotherapy. The numbness is intermittent, with some days being better than others, and it has not worsened over time. No associated weakness, bowel or bladder incontinence, or back pain.  She describes episodes of lymphedema, particularly after physical activities such as yoga. The lymphedema affects her trunk and breast area more than her hand or arm. The swelling fluctuates and is manageable. No associated pain or skin changes.  She mentions experiencing occasional 'sharp, pingy headaches' since having COVID in October, but these headaches are brief and do not persist. No associated visual changes, nausea, or vomiting.  She is currently taking metoprolol , half  a tablet daily, along with a multivitamin, vitamin D3, and tart cherry supplements. Her medication regimen has remained unchanged.  She recalls undergoing an ultrasound last year due to period-like cramping, which has since resolved. The initial ultrasound suggested mild  thickening, but a follow-up with her gynecologist confirmed normal findings with no abnormalities.  REVIEW OF SYSTEMS:  Review of Systems  Constitutional:  Negative for appetite change, chills, fatigue, fever and unexpected weight change.  HENT:   Negative for hearing loss, lump/mass and trouble swallowing.   Eyes:  Negative for eye problems and icterus.  Respiratory:  Negative for chest tightness, cough and shortness of breath.   Cardiovascular:  Negative for chest pain, leg swelling and palpitations.  Gastrointestinal:  Negative for abdominal distention, abdominal pain, constipation, diarrhea, nausea and vomiting.  Endocrine: Negative for hot flashes.  Genitourinary:  Negative for difficulty urinating.   Musculoskeletal:  Negative for arthralgias.  Skin:  Negative for itching and rash.  Neurological:  Negative for dizziness, extremity weakness, headaches and numbness.  Hematological:  Negative for adenopathy. Does not bruise/bleed easily.  Psychiatric/Behavioral:  Negative for depression. The patient is not nervous/anxious.    Breast: Denies any new nodularity, masses, tenderness, nipple changes, or nipple discharge.       PAST MEDICAL/SURGICAL HISTORY:  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HCC)    09/26/2021 (right breast biopsy)   Mild mitral regurgitation 06/2019   Palpitations    Personal history of chemotherapy    Personal history of radiation therapy    PONV (postoperative nausea and vomiting)    Port-A-Cath in place 10/20/2021   Past Surgical History:  Procedure Laterality Date   BREAST BIOPSY  05/03/2022   MM RT RADIOACTIVE SEED LOC MAMMO GUIDE 05/03/2022 [facility] MAMMOGRAPHY   BREAST BIOPSY Right 10/20/2021   BREAST LUMPECTOMY Right 05/04/2022   BREAST LUMPECTOMY WITH RADIOACTIVE SEED AND SENTINEL LYMPH NODE BIOPSY Right 05/04/2022    Procedure: RIGHT BREAST LUMPECTOMY WITH RADIOACTIVE SEED AND SENTINEL LYMPH NODE BIOPSY;  Surgeon: Caralyn Chandler, MD;  Location: [facility];  Service: General;  Laterality: Right;   CHOLECYSTECTOMY     CYST REMOVAL TRUNK Right 05/04/2022   Procedure: CYST REMOVAL AXILLA;  Surgeon: Caralyn Chandler, MD;  Location: [facility];  Service: General;  Laterality: Right;   GALLBLADDER SURGERY     PORT-A-CATH REMOVAL Left 08/10/2022   Procedure: REMOVAL PORT-A-CATH;  Surgeon: Caralyn Chandler, MD;  Location: [facility];  Service: General;  Laterality: Left;   PORTACATH PLACEMENT Left 10/18/2021   Procedure: PLA CEMENT OF PORT;  Surgeon: Caralyn Chandler, MD;  Location[facility];  Service: General;  Laterality: Left;     ALLERGIES:  Allergies  Allergen Reactions   Ace Inhibitors     Cough  Patient states Lisinopril   Shellfish Allergy Nausea And Vomiting     CURRENT MEDICATIONS:  Outpatient Encounter Medications as of 05/20/2023  Medication Sig   cholecalciferol (VITAMIN D3) 25 MCG (1000 UNIT) tablet Take 1,000 Units by mouth daily.   metoprolol  tartrate (LOPRESSOR ) 25 MG tablet Take 12.5 mg by mouth every other day.   Misc Natural Products (TART CHERRY ADVANCED PO) Take 1 tablet by mouth daily.   Multiple Vitamins-Minerals (MULTIVIT/MULTIMINERAL ADULT) LIQD Take 30 mLs by mouth daily.   vitamin B-12 (CYANOCOBALAMIN) 100 MCG tablet  Take 100 mcg by mouth daily.   No facility-administered encounter medications on file as of 05/20/2023.     ONCOLOGIC FAMILY HISTORY:  Family History  Problem Relation Age of Onset   Hypertension Mother    Heart attack Mother    Heart disease Mother        Atrial fibrillation   Obstructive Sleep Apnea Mother    Hypertension Brother    Stroke Maternal Grandmother 36   Breast cancer Other 56       PGM's sister   Colon cancer Neg Hx    Esophageal cancer Neg Hx      SOCIAL HISTORY:  Social History    Socioeconomic History   Marital status: Married    Spouse name: Not on file   Number of children: 2   Years of education: Not on file   Highest education level: Not on file  Occupational History    Comment: Hair dresser  Tobacco Use   Smoking status: Never   Smokeless tobacco: Never  Vaping Use   Vaping status: Never Used  Substance and Sexual Activity   Alcohol  use: Yes    Comment: socially   Drug use: No   Sexual activity: Yes    Partners: Male  Other Topics Concern   Not on file  Social History Narrative   Pt is a Social worker   Married (3rd marriage)   2 sons (both grown)   Enjoys yard work, hiking, Retail banker   Complete 2 year cosmetology   Has one Medical laboratory scientific officer   Social Drivers of Corporate investment banker Strain: Not on Ship broker Insecurity: Not on file  Transportation Needs: Not on file  Physical Activity: Not on file  Stress: Not on file  Social Connections: Not on file  Intimate Partner Violence: Not on file     OBSERVATIONS/OBJECTIVE:  BP (!) 142/67 (BP Location: Left Arm, Patient Position: Sitting)   Pulse 67   Temp 98 F (36.7 C) (Temporal)   Resp 18   Wt 166 lb 1.6 oz (75.3 kg)   LMP 05/14/2019   SpO2 100%   BMI 26.01 kg/m  GENERAL: Patient is a well appearing female in no acute distress HEENT:  Sclerae anicteric.  Neck is supple.  NODES:  No cervical, supraclavicular, or axillary lymphadenopathy palpated.  BREAST EXAM: Right breast status postlumpectomy and radiation no sign of local recurrence left breast is benign. LUNGS:  Clear to auscultation bilaterally.  No wheezes or rhonchi. HEART:  Regular rate and rhythm. No murmur appreciated. EXTREMITIES:  No peripheral edema.   NEURO:  Nonfocal. Well oriented.  Appropriate affect.   LABORATORY DATA:  None for this visit.  DIAGNOSTIC IMAGING:  None for this visit.      ASSESSMENT AND PLAN:  Ms.. Seaton is a pleasant 58 y.o. female with Stage IIB right breast invasive ductal carcinoma,  ER-/PR-/HER2-, diagnosed in 09/2021, treated with neoadjuvant chemotherapy, lumpectomy and adjuvant radiation therapy.    Breast Cancer Post-Treatment No new complaints or concerns. No evidence of recurrence on physical examination. -Continue current regimen of metoprolol  12mg , multivitamin, D3, and [place]. -Consider participation in Guardant Reveal blood test for minimal residual disease detection.  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 No worsening of symptoms. -Continue current management.  Lymphedema Intermittent lymphedema in the trunk and breast, likely secondary to breast cancer treatment. -Continue current management.  General Health Maintenance / Followup Plans -Schedule mammogram in July 2025. -Follow-up with breast surgeon in August  2025. -Return to this clinic in 1 year. The patient was provided an opportunity to ask questions and all were answered. The patient agreed with the plan and demonstrated an understanding of the instructions.   Total encounter time:  30 minutes*in face-to-face visit time, chart review, lab review, care coordination, order entry, and documentation of the encounter time.    *Total Encounter Time as defined by the Centers for Medicare and Medicaid Services includes, in addition to the face-to-face time of a patient visit (documented in the note above) non-face-to-face time: obtaining and reviewing outside history, ordering and reviewing medications, tests or procedures, care coordination (communications with other health care professionals or caregivers) and documentation in the medical record.

## 2023-06-18 NOTE — Progress Notes (Signed)
 HPI: FU palpitations.  Echocardiogram July 2023 showed normal LV function.  Since last seen, she occasionally has palpitations but denies dyspnea, chest pain or syncope.  Current Outpatient Medications  Medication Sig Dispense Refill   cholecalciferol (VITAMIN D3) 25 MCG (1000 UNIT) tablet Take 1,000 Units by mouth daily.     metoprolol succinate (TOPROL-XL) 25 MG 24 hr tablet Take 25 mg by mouth daily.     metoprolol tartrate (LOPRESSOR) 25 MG tablet Take 12.5 mg by mouth every other day.     Misc Natural Products (TART CHERRY ADVANCED PO) Take 1 tablet by mouth daily.     Multiple Vitamins-Minerals (MULTIVIT/MULTIMINERAL ADULT) LIQD Take 30 mLs by mouth daily.     vitamin B-12 (CYANOCOBALAMIN) 100 MCG tablet Take 100 mcg by mouth daily.     No current facility-administered medications for this visit.     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HCC)    09/26/2021 (right breast biopsy)   Mild mitral regurgitation 06/2019   Palpitations    Personal history of chemotherapy    Personal history of radiation therapy    PONV (postoperative nausea and vomiting)    Port-A-Cath in place 10/20/2021    Past Surgical History:  Procedure Laterality Date   BREAST BIOPSY  05/03/2022   MM RT RADIOACTIVE SEED LOC MAMMO GUIDE 05/03/2022 [facility] MAMMOGRAPHY   BREAST BIOPSY Right 10/20/2021   BREAST LUMPECTOMY Right 05/04/2022   BREAST LUMPECTOMY WITH RADIOACTIVE SEED AND SENTINEL LYMPH NODE BIOPSY Right 05/04/2022   Procedure: RIGHT BREAST LUMPECTOMY WITH RADIOACTIVE SEED AND SENTINEL LYMPH NODE BIOPSY;  Surgeon: Griselda Miner, MD;  Location: [facility];  Service: General;  Laterality: Right;   CHOLECYSTECTOMY     CYST REMOVAL TRUNK Right 05/04/2022   Procedure: CYST REMOVAL AXILLA;  Surgeon: Griselda Miner, MD;  Location: [facility];  Service: General;  Laterality: Right;   GALLBLADDER SURGERY     PORT-A-CATH REMOVAL Left 08/10/2022   Procedure: REMOVAL PORT-A-CATH;  Surgeon: Griselda Miner, MD;  Location: [facility];  Service: General;  Laterality: Left;   PORTACATH PLACEMENT Left 10/18/2021   Procedure: PLA CEMENT OF PORT;  Surgeon: Griselda Miner, MD;  Location: [facility] OR;  Service: General;  Laterality: Left;    Social History   Socioeconomic History   Marital status: Married    Spouse name: Not on file   Number of children: 2   Years of education: Not on file   Highest education level: Not on file  Occupational History    Comment: Hair dresser  Tobacco Use   Smoking status: Never   Smokeless tobacco: Never  Vaping Use   Vaping status: Never Used  Substance and Sexual Activity   Alcohol use: Yes    Comment: socially   Drug use: No   Sexual activity: Yes    Partners: Male    Comment: MARRIED  Other Topics Concern   Not on file  Social History Narrative   Pt is a hair stylist   Married (3rd marriage)   2 sons (both grown)   Enjoys yard work, hiking, wineries   Complete 2 year cosmetology   Has one Medical laboratory scientific officer   Social Drivers of Corporate investment banker Strain: Not on Ship broker Insecurity: Not on Chartered certified accountant  Needs: Not on file  Physical Activity: Not on file  Stress: Not on file  Social Connections: Not on file  Intimate Partner Violence: Not on file    Family History  Problem Relation Age of Onset   Hypertension Mother    Heart attack Mother    Heart disease Mother        Atrial fibrillation   Obstructive Sleep Apnea Mother    Hypertension Brother    Stroke Maternal Grandmother 9   Breast cancer Other 61       PGM's sister   Colon cancer Neg Hx    Esophageal cancer Neg Hx     ROS: no fevers or chills, productive cough, hemoptysis, dysphasia, odynophagia, melena, hematochezia, dysuria, hematuria, rash, seizure activity, orthopnea, PND, pedal edema,  claudication. Remaining systems are negative.  Physical Exam: Well-developed well-nourished in no acute distress.  Skin is warm and dry.  HEENT is normal.  Neck is supple.  Chest is clear to auscultation with normal expansion.  Cardiovascular exam is regular rate and rhythm.  Abdominal exam nontender or distended. No masses palpated. Extremities show no edema. neuro grossly intact  EKG Interpretation Date/Time:  Tuesday July 02 2023 09:49:04 EDT Ventricular Rate:  63 PR Interval:  210 QRS Duration:  84 QT Interval:  422 QTC Calculation: 431 R Axis:   -14  Text Interpretation: Sinus rhythm with 1st degree A-V block When compared with ECG of 27-Oct-2021 14:03, Vent. rate has decreased BY  33 BPM Confirmed by Olga Millers ([id number]) on 07/02/2023 9:54:03 AM    A/P  1 palpitations-symptoms are reasonably well controlled.  Will add beta-blockade in the future if needed.  2 hypertension-blood pressure controlled.  3 history of mitral regurgitation-trace on most recent echocardiogram.  4 history of breast cancer-Per oncology.  Olga Millers, MD

## 2023-07-01 ENCOUNTER — Ambulatory Visit: Payer: BC Managed Care – PPO | Attending: General Surgery

## 2023-07-01 VITALS — Wt 158.5 lb

## 2023-07-01 DIAGNOSIS — Z483 Aftercare following surgery for neoplasm: Secondary | ICD-10-CM | POA: Insufficient documentation

## 2023-07-01 NOTE — Therapy (Signed)
 OUTPATIENT PHYSICAL THERAPY SOZO SCREENING NOTE   Patient Name: Wendy Webb MRN: [id number] DOB:06/27/1965, 58 y.o., female Today's Date: 07/01/2023  PCP: Royann Shivers, PA-C REFERRING PROVIDER: Griselda Miner, MD   PT End of Session - 07/01/23 [phone]     Visit Number 8   # unchnaged due to screen only   PT Start Time 0915    PT Stop Time 0919    PT Time Calculation (min) 4 min    Activity Tolerance Patient tolerated treatment well    Behavior During Therapy [facility] for tasks assessed/performed             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HCC)    09/26/2021 (right breast biopsy)   Mild mitral regurgitation 06/2019   Palpitations    Personal history of chemotherapy    Personal history of radiation therapy    PONV (postoperative nausea and vomiting)    Port-A-Cath in place 10/20/2021   Past Surgical History:  Procedure Laterality Date   BREAST BIOPSY  05/03/2022   MM RT RADIOACTIVE SEED LOC MAMMO GUIDE 05/03/2022 [facility] MAMMOGRAPHY   BREAST BIOPSY Right 10/20/2021   BREAST LUMPECTOMY Right 05/04/2022   BREAST LUMPECTOMY WITH RADIOACTIVE SEED AND SENTINEL LYMPH NODE BIOPSY Right 05/04/2022   Procedure: RIGHT BREAST LUMPECTOMY WITH RADIOACTIVE SEED AND SENTINEL LYMPH NODE BIOPSY;  Surgeon: Griselda Miner, MD;  Location: [facility];  Service: General;  Laterality: Right;   CHOLECYSTECTOMY     CYST REMOVAL TRUNK Right 05/04/2022   Procedure: CYST REMOVAL AXILLA;  Surgeon: Griselda Miner, MD;  Location: [facility];  Service: General;  Laterality: Right;   GALLBLADDER SURGERY     PORT-A-CATH REMOVAL Left 08/10/2022   Procedure: REMOVAL PORT-A-CATH;  Surgeon: Griselda Miner, MD;  Location: [facility];  Service: General;  Laterality: Left;   PORTACATH PLACEMENT Left 10/18/2021   Procedure:  PLA CEMENT OF PORT;  Surgeon: Griselda Miner, MD;  Location: [facility] OR;  Service: General;  Laterality: Left;   Patient Active Problem List   Diagnosis Date Noted   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 It measures 2.5 cm and is located in the upper outer quadrant. It is triple negative with a Ki67 of 95%. Completed chemotherapy AC, carboplatin, Paclitaxel, and keytruda. Rt lumpectomy and SLNB on 05/04/22 with 2 negative nodes removed   PRECAUTIONS: right UE Lymphedema risk, None  SUBJECTIVE: Pt returns for her 3 month L-dex screen. "I've been working out in my yard and wearing my sleeve when I'm busy."  PAIN:  Are you having pain? No  SOZO SCREENING: Patient was assessed today using the SOZO machine to determine the lymphedema index score. This was compared to her baseline score. It was determined that she is within the recommended range when  compared to her baseline and no further action is needed at this time. She will continue SOZO screenings. These are done every 3 months for 2 years post operatively followed by every 6 months for 2 years, and then annually.   L-DEX FLOWSHEETS - 07/01/23 0900       L-DEX LYMPHEDEMA SCREENING   Measurement Type Unilateral    L-DEX MEASUREMENT EXTREMITY Upper Extremity    POSITION  Standing    DOMINANT SIDE Left    At Risk Side Right    BASELINE SCORE (UNILATERAL) 5.6     L-DEX SCORE (UNILATERAL) 8    VALUE CHANGE (UNILAT) 2.4               Hermenia Bers, PTA 07/01/2023, 9:18 AM

## 2023-07-02 ENCOUNTER — Ambulatory Visit: Payer: BC Managed Care – PPO | Attending: Cardiology | Admitting: Cardiology

## 2023-07-02 ENCOUNTER — Encounter: Payer: Self-pay | Admitting: Cardiology

## 2023-07-02 VITALS — BP 132/84 | HR 63 | Ht 67.0 in | Wt 162.4 lb

## 2023-07-02 DIAGNOSIS — R002 Palpitations: Secondary | ICD-10-CM

## 2023-07-02 DIAGNOSIS — I10 Essential (primary) hypertension: Secondary | ICD-10-CM | POA: Diagnosis not present

## 2023-07-02 NOTE — Patient Instructions (Signed)

## 2023-07-28 ENCOUNTER — Emergency Department (HOSPITAL_BASED_OUTPATIENT_CLINIC_OR_DEPARTMENT_OTHER)

## 2023-07-28 ENCOUNTER — Other Ambulatory Visit: Payer: Self-pay

## 2023-07-28 ENCOUNTER — Encounter (HOSPITAL_BASED_OUTPATIENT_CLINIC_OR_DEPARTMENT_OTHER): Payer: Self-pay | Admitting: *Deleted

## 2023-07-28 ENCOUNTER — Emergency Department (HOSPITAL_BASED_OUTPATIENT_CLINIC_OR_DEPARTMENT_OTHER)
Admission: EM | Admit: 2023-07-28 | Discharge: 2023-07-28 | Disposition: A | Discharge status: Home / Self Care | Attending: Emergency Medicine | Admitting: Emergency Medicine

## 2023-07-28 DIAGNOSIS — E876 Hypokalemia: Secondary | ICD-10-CM | POA: Insufficient documentation

## 2023-07-28 DIAGNOSIS — I10 Essential (primary) hypertension: Secondary | ICD-10-CM | POA: Diagnosis not present

## 2023-07-28 DIAGNOSIS — Z853 Personal history of malignant neoplasm of breast: Secondary | ICD-10-CM | POA: Insufficient documentation

## 2023-07-28 DIAGNOSIS — R0789 Other chest pain: Secondary | ICD-10-CM | POA: Insufficient documentation

## 2023-07-28 DIAGNOSIS — M546 Pain in thoracic spine: Secondary | ICD-10-CM | POA: Diagnosis present

## 2023-07-28 LAB — CBC
HCT: 33 % — ABNORMAL LOW (ref 36.0–46.0)
Hemoglobin: 11.4 g/dL — ABNORMAL LOW (ref 12.0–15.0)
MCH: 33.4 pg (ref 26.0–34.0)
MCHC: 34.5 g/dL (ref 30.0–36.0)
MCV: 96.8 fL (ref 80.0–100.0)
Platelets: 208 10*3/uL (ref 150–400)
RBC: 3.41 MIL/uL — ABNORMAL LOW (ref 3.87–5.11)
RDW: 12.5 % (ref 11.5–15.5)
WBC: 6.2 10*3/uL (ref 4.0–10.5)
nRBC: 0 % (ref 0.0–0.2)

## 2023-07-28 LAB — COMPREHENSIVE METABOLIC PANEL WITH GFR
ALT: 18 U/L (ref 0–44)
AST: 21 U/L (ref 15–41)
Albumin: 3.7 g/dL (ref 3.5–5.0)
Alkaline Phosphatase: 57 U/L (ref 38–126)
Anion gap: 7 (ref 5–15)
BUN: 12 mg/dL (ref 6–20)
CO2: 27 mmol/L (ref 22–32)
Calcium: 8.9 mg/dL (ref 8.9–10.3)
Chloride: 104 mmol/L (ref 98–111)
Creatinine, Ser: 0.57 mg/dL (ref 0.44–1.00)
GFR, Estimated: >60 mL/min (ref >60)
Glucose, Bld: 106 mg/dL — ABNORMAL HIGH (ref 70–99)
Potassium: 3.4 mmol/L — ABNORMAL LOW (ref 3.5–5.1)
Sodium: 138 mmol/L (ref 135–145)
Total Bilirubin: 0.9 mg/dL (ref 0.0–1.2)
Total Protein: 6.9 g/dL (ref 6.5–8.1)

## 2023-07-28 LAB — TROPONIN I (HIGH SENSITIVITY)
Troponin I (High Sensitivity): 6 ng/L (ref <18)
Troponin I (High Sensitivity): 6 ng/L (ref <18)

## 2023-07-28 LAB — D-DIMER, QUANTITATIVE: D-Dimer, Quant: <0.27 ug{FEU}/mL (ref 0.00–0.50)

## 2023-07-28 MED ORDER — ACETAMINOPHEN 500 MG PO TABS
1000.0000 mg | ORAL_TABLET | Freq: Once | ORAL | Status: AC
  Administered 2023-07-28: 1000 mg via ORAL
  Filled 2023-07-28: qty 2

## 2023-07-28 MED ORDER — LIDOCAINE 5 % EX PTCH
1.0000 | MEDICATED_PATCH | CUTANEOUS | Status: DC
  Administered 2023-07-28: 1 via TRANSDERMAL
  Filled 2023-07-28: qty 1

## 2023-07-28 MED ORDER — CYCLOBENZAPRINE HCL 5 MG PO TABS
5.0000 mg | ORAL_TABLET | Freq: Once | ORAL | Status: AC
  Administered 2023-07-28: 5 mg via ORAL
  Filled 2023-07-28: qty 1

## 2023-07-28 NOTE — ED Provider Notes (Signed)
 Received patient in turnover from Dr. Adrain Alar.  Please see their note for further details of Hx, PE.  Briefly patient is a 58 y.o. female with a Back Pain .  Plan for delta trop, likely home post.  Second troponin is negative.  Discussed results with patient.  She would like to go home.  PCP and cardiology follow-up.    Albertus Hughs, DO 07/28/23 [phone]

## 2023-07-28 NOTE — ED Provider Notes (Signed)
 [place] EMERGENCY DEPARTMENT AT [facility] Provider Note   CSN: [id number] Arrival date & time: 07/28/23  4098     History  Chief Complaint  Patient presents with   Back Pain    Wendy Webb is a 58 y.o. female.   Back Pain Associated symptoms: chest pain      58 year old female with medical history significant for hypertension, IBS, invasive ductal carcinoma of the breast status post lumpectomy and radiation therapy and chemotherapy with some residual neuropathy presenting to the emergency department with multiple complaints.  The patient states that she has had symptoms of allergic sinusitis with clear drainage from her sinuses and pressure and congestion over the past several days.  She has been doing a lot of heavy lifting and cleaning to get ready for the Easter holiday, denies any falls or trauma.  Yesterday she developed pain in between her shoulder blades in the back.  Pain is not ripping or tearing in nature.  She took Tylenol  which improved the pain and it started to resolve.  She then developed some left-sided shooting sharp pains as well as chest pressure that started this morning.  Symptoms have eased off some.  She endorses a bilateral tension type headache located in a bandlike pattern across her forehead, not sudden in onset or maximal in onset.  She denies any fevers or chills.  No vision changes or neurodeficits.  She denies any saddle anesthesia.  No falls or trauma, no new focal numbness or weakness in the lower extremities although she did have an episode where she felt weak in the bilateral legs transiently over the weekend but this did not last.  Home Medications Prior to Admission medications   Medication Sig Start Date End Date Taking? Authorizing Provider  cholecalciferol (VITAMIN D3) 25 MCG (1000 UNIT) tablet Take 1,000 Units by mouth daily.    [provider]  metoprolol  succinate (TOPROL -XL) 25 MG 24 hr tablet Take 25 mg by mouth  daily. 04/08/23   [provider]  metoprolol  tartrate (LOPRESSOR ) 25 MG tablet Take 12.5 mg by mouth every other day.    [provider]  Misc Natural Products (TART CHERRY ADVANCED PO) Take 1 tablet by mouth daily.    [provider]  Multiple Vitamins-Minerals (MULTIVIT/MULTIMINERAL ADULT) LIQD Take 30 mLs by mouth daily.    [provider]  vitamin B-12 (CYANOCOBALAMIN) 100 MCG tablet Take 100 mcg by mouth daily.    [provider]      Allergies    Ace inhibitors and Shellfish allergy    Review of Systems   Review of Systems  HENT:  Positive for sinus pressure and sinus pain.   Cardiovascular:  Positive for chest pain.  Musculoskeletal:  Positive for back pain.  All other systems reviewed and are negative.   Physical Exam Updated Vital Signs BP (!) 151/71   Pulse 71   Temp 98 F (36.7 C) (Oral)   Resp 15   LMP 05/14/2019   SpO2 100%  Physical Exam Vitals and nursing note reviewed.  Constitutional:      General: She is not in acute distress.    Appearance: She is well-developed.  HENT:     Head: Normocephalic and atraumatic.  Eyes:     Conjunctiva/sclera: Conjunctivae normal.  Cardiovascular:     Rate and Rhythm: Normal rate and regular rhythm.     Pulses: Normal pulses.     Heart sounds: No murmur heard. Pulmonary:  Effort: Pulmonary effort is normal. No respiratory distress.     Breath sounds: Normal breath sounds.  Abdominal:     Palpations: Abdomen is soft.     Tenderness: There is no abdominal tenderness.  Musculoskeletal:        General: Tenderness present. No swelling.     Cervical back: Neck supple.     Right lower leg: No edema.     Left lower leg: No edema.     Comments: No TTP of the cervical or lumbar spine. Mild TTP of the mid thoracic spine. Negative straight leg raise test bilaterally  Skin:    General: Skin is warm and dry.     Capillary Refill: Capillary refill takes less than 2 seconds.   Neurological:     General: No focal deficit present.     Mental Status: She is alert and oriented to person, place, and time. Mental status is at baseline.     Cranial Nerves: No cranial nerve deficit.     Sensory: No sensory deficit.     Motor: No weakness.  Psychiatric:        Mood and Affect: Mood normal.     ED Results / Procedures / Treatments   Labs (all labs ordered are listed, but only abnormal results are displayed) Labs Reviewed  COMPREHENSIVE METABOLIC PANEL WITH GFR - Abnormal; Notable for the following components:      Result Value   Potassium 3.4 (*)    Glucose, Bld 106 (*)    All other components within normal limits  CBC - Abnormal; Notable for the following components:   RBC 3.41 (*)    Hemoglobin 11.4 (*)    HCT 33.0 (*)    All other components within normal limits  D-DIMER, QUANTITATIVE  TROPONIN I (HIGH SENSITIVITY)  TROPONIN I (HIGH SENSITIVITY)    EKG None  Radiology DG Thoracic Spine 2 View Result Date: 07/28/2023 CLINICAL DATA:  Back pain. EXAM: THORACIC SPINE 2 VIEWS COMPARISON:  None FINDINGS: There is no evidence of thoracic spine fracture. Alignment is normal. No other significant bone abnormalities are identified. Surgical clips noted in the right upper quadrant of the abdomen. IMPRESSION: Negative. Electronically Signed   By: Kimberley Penman M.D.   On: 07/28/2023 06:40   DG Chest Port 1 View Result Date: 07/28/2023 CLINICAL DATA:  Chest pressure. EXAM: PORTABLE CHEST 1 VIEW COMPARISON:  10/18/2021 FINDINGS: Previous left chest wall port a catheter has been removed. Heart size and mediastinal contours are stable. There is no pleural fluid, interstitial edema or airspace disease. Surgical clips identified within the right axilla. The visualized osseous structures appear intact. IMPRESSION: No acute cardiopulmonary disease. Electronically Signed   By: Kimberley Penman M.D.   On: 07/28/2023 06:11    Procedures Procedures    Medications Ordered in  ED Medications  lidocaine  (LIDODERM ) 5 % 1 patch (has no administration in time range)  cyclobenzaprine  (FLEXERIL ) tablet 5 mg (has no administration in time range)  acetaminophen  (TYLENOL ) tablet 1,000 mg (1,000 mg Oral Given 07/28/23 0981)    ED Course/ Medical Decision Making/ A&P                                 Medical Decision Making Amount and/or Complexity of Data Reviewed Labs: ordered. Radiology: ordered.  Risk OTC drugs.     58 year old female with medical history significant for hypertension, IBS, invasive ductal carcinoma of the breast status  post lumpectomy and radiation therapy and chemotherapy with some residual neuropathy presenting to the emergency department with multiple complaints.  The patient states that she has had symptoms of allergic sinusitis with clear drainage from her sinuses and pressure and congestion over the past several days.  She has been doing a lot of heavy lifting and cleaning to get ready for the Easter holiday, denies any falls or trauma.  Yesterday she developed pain in between her shoulder blades in the back.  Pain is not ripping or tearing in nature.  She took Tylenol  which improved the pain and it started to resolve.  She then developed some left-sided shooting sharp pains as well as chest pressure that started this morning.  Symptoms have eased off some.  She endorses a bilateral tension type headache located in a bandlike pattern across her forehead, not sudden in onset or maximal in onset.  She denies any fevers or chills.  No vision changes or neurodeficits.  She denies any saddle anesthesia.  No falls or trauma, no new focal numbness or weakness in the lower extremities although she did have an episode where she felt weak in the bilateral legs transiently over the weekend but this did not last..  On arrival, the patient was vitally stable, afebrile, not tachycardic or tachypneic, saturating high percent on room air, BP 151/71.  Physical exam  significant for mild tenderness to palpation in the mid thoracic spine, neurologic exam intact, negative straight leg raise test.  Lungs were clear to auscultation bilaterally.  Suspect likely muscle strain, considered PE, ACS, pneumonia, pneumothorax, viral syndrome.  EKG revealed sinus rhythm, ventricular rate 66, prolonged PR 218, no acute ischemic changes, QTc normal.  A chest x-ray was performed which revealed no active cardiopulmonary disease.  An x-ray 2 view of the thoracic spine was also negative.  Laboratory evaluation revealed CMP with mild hypokalemia to 3.4, CBC without a leukocytosis, initial cardiac troponin 6, D-dimer negative.  Low concern for PE, low concern for aortic dissection, pain is not ripping or tearing, patient has intact symmetric pulses bilaterally, troponin and dimer negative.  Plan to follow-up results of delta troponin given the patient's report of shooting chest pressure earlier today, reassess the patient following the above interventions.  Signout given to Dr. Inga Manges at 0700, disposition pending results of diagnostic testing and reassessment.   Final Clinical Impression(s) / ED Diagnoses Final diagnoses:  Acute midline thoracic back pain  Chest pressure    Rx / DC Orders ED Discharge Orders     None         Rosealee Concha, MD 07/28/23 [phone]

## 2023-07-28 NOTE — ED Triage Notes (Addendum)
 Pt presents with c/o back pain Mid back - between shoulder blade. Took tylenol  for pain- effective.  It started - Saturday  Other symptoms -  a couple shooting pain in chest - Takes 12.5 mg Metoprolol   daily .And tightening feeling of bilateral maxillary glands w/ headache. Pt points to temporal area - H/A  pain 3/10

## 2023-07-28 NOTE — Discharge Instructions (Addendum)
 Your EKG, chest x-ray, thoracic spine x-ray, laboratory evaluation was overall reassuring.  Please follow-up with your primary care provider for further management.

## 2023-09-30 ENCOUNTER — Ambulatory Visit: Attending: General Surgery

## 2023-09-30 VITALS — Wt 163.4 lb

## 2023-09-30 DIAGNOSIS — Z483 Aftercare following surgery for neoplasm: Secondary | ICD-10-CM | POA: Insufficient documentation

## 2023-09-30 NOTE — Therapy (Signed)
 OUTPATIENT PHYSICAL THERAPY SOZO SCREENING NOTE   Patient Name: Wendy Webb MRN: [id number] DOB:06/02/1965, 58 y.o., female Today's Date: 09/30/2023  PCP: Jeanette Comer BRAVO, PA-C REFERRING PROVIDER: Curvin Deward MOULD, MD   PT End of Session - 09/30/23 [phone]     Visit Number 8   # unchanged due to screen only   PT Start Time 0922    PT Stop Time 0926    PT Time Calculation (min) 4 min    Activity Tolerance Patient tolerated treatment well    Behavior During Therapy [facility] for tasks assessed/performed          Past Medical History:  Diagnosis Date   Breast cancer (HCC) 2023   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+   Family history of breast cancer 10/04/2021   Hypertension    IBS (irritable bowel syndrome)    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HCC)    09/26/2021 (right breast biopsy)   Mild mitral regurgitation 06/2019   Palpitations    Personal history of chemotherapy    Personal history of radiation therapy    PONV (postoperative nausea and vomiting)    Port-A-Cath in place 10/20/2021   Past Surgical History:  Procedure Laterality Date   BREAST BIOPSY  05/03/2022   MM RT RADIOACTIVE SEED LOC MAMMO GUIDE 05/03/2022 [facility] MAMMOGRAPHY   BREAST BIOPSY Right 10/20/2021   BREAST LUMPECTOMY Right 05/04/2022   BREAST LUMPECTOMY WITH RADIOACTIVE SEED AND SENTINEL LYMPH NODE BIOPSY Right 05/04/2022   Procedure: RIGHT BREAST LUMPECTOMY WITH RADIOACTIVE SEED AND SENTINEL LYMPH NODE BIOPSY;  Surgeon: Curvin Deward MOULD, MD;  Location: [facility];  Service: General;  Laterality: Right;   CHOLECYSTECTOMY     CYST REMOVAL TRUNK Right 05/04/2022   Procedure: CYST REMOVAL AXILLA;  Surgeon: Curvin Deward MOULD, MD;  Location: [facility];  Service: General;  Laterality: Right;   GALLBLADDER SURGERY     PORT-A-CATH REMOVAL Left 08/10/2022   Procedure: REMOVAL PORT-A-CATH;  Surgeon: Curvin Deward MOULD, MD;  Location:  SURGERY CENTER;  Service: General;  Laterality: Left;    PORTACATH PLACEMENT Left 10/18/2021   Procedure: PLA CEMENT OF PORT;  Surgeon: Curvin Deward MOULD, MD;  Location: [facility] OR;  Service: General;  Laterality: Left;   Patient Active Problem List   Diagnosis Date Noted   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 It measures 2.5 cm and is located in the upper outer quadrant. It is triple negative with a Ki67 of 95%. Completed chemotherapy AC, carboplatin , Paclitaxel , and keytruda . Rt lumpectomy and SLNB on 05/04/22 with 2 negative nodes removed   PRECAUTIONS: right UE Lymphedema risk, None  SUBJECTIVE: Pt returns for her 3 month L-dex screen.   PAIN:  Are you having pain? No  SOZO SCREENING: Patient was assessed today using the SOZO machine to determine the lymphedema index score. This was compared to her baseline score. It was determined that she is within the recommended range when compared to her baseline  and no further action is needed at this time. She will continue SOZO screenings. These are done every 3 months for 2 years post operatively followed by every 6 months for 2 years, and then annually.   L-DEX FLOWSHEETS - 09/30/23 0900       L-DEX LYMPHEDEMA SCREENING   Measurement Type Unilateral    L-DEX MEASUREMENT EXTREMITY Upper Extremity    POSITION  Standing    DOMINANT SIDE Left    At Risk Side Right    BASELINE SCORE (UNILATERAL) 5.6    L-DEX SCORE  (UNILATERAL) 8.5    VALUE CHANGE (UNILAT) 2.9            Aden Berwyn Caldron, PTA 09/30/2023, 9:24 AM

## 2023-10-01 ENCOUNTER — Telehealth (HOSPITAL_BASED_OUTPATIENT_CLINIC_OR_DEPARTMENT_OTHER): Payer: Self-pay

## 2023-10-21 ENCOUNTER — Ambulatory Visit
Admission: RE | Admit: 2023-10-21 | Discharge: 2023-10-21 | Disposition: A | Discharge status: Home / Self Care | Source: Ambulatory Visit | Attending: Hematology and Oncology

## 2023-10-21 DIAGNOSIS — Z171 Estrogen receptor negative status [ER-]: Secondary | ICD-10-CM

## 2023-11-16 ENCOUNTER — Emergency Department (HOSPITAL_BASED_OUTPATIENT_CLINIC_OR_DEPARTMENT_OTHER)
Admission: EM | Admit: 2023-11-16 | Discharge: 2023-11-16 | Disposition: A | Discharge status: Home / Self Care | Attending: Emergency Medicine | Admitting: Emergency Medicine

## 2023-11-16 ENCOUNTER — Emergency Department (HOSPITAL_BASED_OUTPATIENT_CLINIC_OR_DEPARTMENT_OTHER)

## 2023-11-16 ENCOUNTER — Encounter (HOSPITAL_BASED_OUTPATIENT_CLINIC_OR_DEPARTMENT_OTHER): Payer: Self-pay | Admitting: Emergency Medicine

## 2023-11-16 DIAGNOSIS — K5900 Constipation, unspecified: Secondary | ICD-10-CM | POA: Insufficient documentation

## 2023-11-16 DIAGNOSIS — Z79899 Other long term (current) drug therapy: Secondary | ICD-10-CM | POA: Insufficient documentation

## 2023-11-16 DIAGNOSIS — R1011 Right upper quadrant pain: Secondary | ICD-10-CM | POA: Diagnosis present

## 2023-11-16 LAB — CBC WITH DIFFERENTIAL/PLATELET
Abs Immature Granulocytes: 0.02 K/uL (ref 0.00–0.07)
Basophils Absolute: 0 K/uL (ref 0.0–0.1)
Basophils Relative: 0 %
Eosinophils Absolute: 0 K/uL (ref 0.0–0.5)
Eosinophils Relative: 1 %
HCT: 35.9 % — ABNORMAL LOW (ref 36.0–46.0)
Hemoglobin: 12.1 g/dL (ref 12.0–15.0)
Immature Granulocytes: 0 %
Lymphocytes Relative: 20 %
Lymphs Abs: 1.3 K/uL (ref 0.7–4.0)
MCH: 32.7 pg (ref 26.0–34.0)
MCHC: 33.7 g/dL (ref 30.0–36.0)
MCV: 97 fL (ref 80.0–100.0)
Monocytes Absolute: 0.5 K/uL (ref 0.1–1.0)
Monocytes Relative: 8 %
Neutro Abs: 4.4 K/uL (ref 1.7–7.7)
Neutrophils Relative %: 71 %
Platelets: 235 K/uL (ref 150–400)
RBC: 3.7 MIL/uL — ABNORMAL LOW (ref 3.87–5.11)
RDW: 12.5 % (ref 11.5–15.5)
WBC: 6.3 K/uL (ref 4.0–10.5)
nRBC: 0 % (ref 0.0–0.2)

## 2023-11-16 LAB — URINALYSIS, ROUTINE W REFLEX MICROSCOPIC
Bilirubin Urine: NEGATIVE
Glucose, UA: NEGATIVE mg/dL
Ketones, ur: NEGATIVE mg/dL
Leukocytes,Ua: NEGATIVE
Nitrite: NEGATIVE
Protein, ur: NEGATIVE mg/dL
Specific Gravity, Urine: 1.01 (ref 1.005–1.030)
pH: 7 (ref 5.0–8.0)

## 2023-11-16 LAB — COMPREHENSIVE METABOLIC PANEL WITH GFR
ALT: 19 U/L (ref 0–44)
AST: 22 U/L (ref 15–41)
Albumin: 4.1 g/dL (ref 3.5–5.0)
Alkaline Phosphatase: 58 U/L (ref 38–126)
Anion gap: 10 (ref 5–15)
BUN: 9 mg/dL (ref 6–20)
CO2: 25 mmol/L (ref 22–32)
Calcium: 9.3 mg/dL (ref 8.9–10.3)
Chloride: 103 mmol/L (ref 98–111)
Creatinine, Ser: 0.64 mg/dL (ref 0.44–1.00)
GFR, Estimated: >60 mL/min (ref >60)
Glucose, Bld: 95 mg/dL (ref 70–99)
Potassium: 4.3 mmol/L (ref 3.5–5.1)
Sodium: 138 mmol/L (ref 135–145)
Total Bilirubin: 0.5 mg/dL (ref 0.0–1.2)
Total Protein: 6.7 g/dL (ref 6.5–8.1)

## 2023-11-16 LAB — URINALYSIS, MICROSCOPIC (REFLEX)

## 2023-11-16 LAB — LIPASE, BLOOD: Lipase: 19 U/L (ref 11–51)

## 2023-11-16 MED ORDER — POLYETHYLENE GLYCOL 3350 17 GM/SCOOP PO POWD: 119.0000 g | Freq: Once | ORAL | 0 refills | Status: AC

## 2023-11-16 MED ORDER — IOHEXOL 300 MG/ML  SOLN
125.0000 mL | Freq: Once | INTRAMUSCULAR | Status: AC | PRN
  Administered 2023-11-16: 100 mL via INTRAVENOUS

## 2023-11-16 NOTE — ED Provider Notes (Signed)
  Physical Exam  BP (!) 140/75   Pulse 61   Temp 99 F (37.2 C)   Resp 18   Ht 5' 7 (1.702 m)   Wt 73.9 kg   LMP 05/14/2019   SpO2 100%   BMI 25.53 kg/m   Physical Exam Vitals and nursing note reviewed.  Constitutional:      General: She is not in acute distress.    Appearance: She is well-developed.  HENT:     Head: Normocephalic and atraumatic.  Eyes:     Conjunctiva/sclera: Conjunctivae normal.  Cardiovascular:     Rate and Rhythm: Normal rate and regular rhythm.     Heart sounds: No murmur heard. Pulmonary:     Effort: Pulmonary effort is normal. No respiratory distress.     Breath sounds: Normal breath sounds.  Abdominal:     Palpations: Abdomen is soft.     Tenderness: There is no abdominal tenderness.  Musculoskeletal:        General: No swelling.     Cervical back: Neck supple.  Skin:    General: Skin is warm and dry.     Capillary Refill: Capillary refill takes less than 2 seconds.  Neurological:     Mental Status: She is alert.  Psychiatric:        Mood and Affect: Mood normal.     Procedures  Procedures  ED Course / MDM    Medical Decision Making Amount and/or Complexity of Data Reviewed Labs: ordered. Radiology: ordered.  Risk OTC drugs. Prescription drug management.   Care handed off by Signe Servant, PA-C.  She is a 58 year old female who presented to the ED today secondary to constipation.  Also has abdominal discomfort secondary to constipation.  At time of care handoff the plan was to await results of CT imaging to rule out bowel obstruction.  Review of this showed that there was no acute bowel obstruction however there is some diverticulosis without diverticulitis.  As such, we will proceed with plan to discharge patient with outpatient prescription of MiraLAX  given.  Follow-up with her primary care as needed.       Myriam Dorn BROCKS, PA 11/16/23 2007    Geraldene Hamilton, MD 11/18/23 0010

## 2023-11-16 NOTE — ED Provider Notes (Signed)
 [place] EMERGENCY DEPARTMENT AT [facility] Provider Note   CSN: [id number] Arrival date & time: 11/16/23  1625     Patient presents with: Constipation   Wendy Webb is a 58 y.o. female patient with history of triple negative breast cancer currently in remission who presents to the emergency department today for further evaluation of constipation.  Patient states she has suffered from constipation since she was in her mid 76s.  Patient has not had a bowel movement since Monday.  She also states that before this she had a normal bowel frequency of just about every single day.  She has been taking fiber Gummies.  Patient does not have the urge to defecate or have a bowel movement.  She denies any nausea, vomiting, fever, chills.  She denies any urinary symptoms.  She does endorse some right upper quadrant abdominal pain.  She does mention that she had radiation for a month on the right breast and right upper quadrant.  She does not have a gallbladder.    Constipation      Prior to Admission medications   Medication Sig Start Date End Date Taking? Authorizing Provider  polyethylene glycol powder (GLYCOLAX /MIRALAX ) 17 GM/SCOOP powder Take 119 g by mouth once for 1 dose. 11/16/23 11/16/23 Yes Eusebio Blazejewski M, PA-C  cholecalciferol (VITAMIN D3) 25 MCG (1000 UNIT) tablet Take 1,000 Units by mouth daily.    [provider]  metoprolol  succinate (TOPROL -XL) 25 MG 24 hr tablet Take 25 mg by mouth daily. 04/08/23   [provider]  metoprolol  tartrate (LOPRESSOR ) 25 MG tablet Take 12.5 mg by mouth every other day.    [provider]  Misc Natural Products (TART CHERRY ADVANCED PO) Take 1 tablet by mouth daily.    [provider]  Multiple Vitamins-Minerals (MULTIVIT/MULTIMINERAL ADULT) LIQD Take 30 mLs by mouth daily.    [provider]  vitamin B-12 (CYANOCOBALAMIN) 100 MCG tablet Take 100 mcg by mouth daily.    [provider]     Allergies: Ace inhibitors and Shellfish allergy    Review of Systems  Gastrointestinal:  Positive for constipation.  All other systems reviewed and are negative.   Updated Vital Signs BP 135/80   Pulse 66   Temp 99 F (37.2 C)   Resp 18   Ht 5' 7 (1.702 m)   Wt 73.9 kg   LMP 05/14/2019   SpO2 100%   BMI 25.53 kg/m   Physical Exam Vitals and nursing note reviewed.  Constitutional:      General: She is not in acute distress.    Appearance: Normal appearance.  HENT:     Head: Normocephalic and atraumatic.  Eyes:     General:        Right eye: No discharge.        Left eye: No discharge.  Cardiovascular:     Comments: Regular rate and rhythm.  S1/S2 are distinct without any evidence of murmur, rubs, or gallops.  Radial pulses are 2+ bilaterally.  Dorsalis pedis pulses are 2+ bilaterally.  No evidence of pedal edema. Pulmonary:     Comments: Clear to auscultation bilaterally.  Normal effort.  No respiratory distress.  No evidence of wheezes, rales, or rhonchi heard throughout. Abdominal:     General: Abdomen is flat. Bowel sounds are normal. There is no distension.     Tenderness: There is no abdominal tenderness. There is no guarding or rebound.  Musculoskeletal:  General: Normal range of motion.     Cervical back: Neck supple.  Skin:    General: Skin is warm and dry.     Findings: No rash.  Neurological:     General: No focal deficit present.     Mental Status: She is alert.  Psychiatric:        Mood and Affect: Mood normal.        Behavior: Behavior normal.     (all labs ordered are listed, but only abnormal results are displayed) Labs Reviewed  CBC WITH DIFFERENTIAL/PLATELET - Abnormal; Notable for the following components:      Result Value   RBC 3.70 (*)    HCT 35.9 (*)    All other components within normal limits  URINALYSIS, ROUTINE W REFLEX MICROSCOPIC - Abnormal; Notable for the following components:   Color, Urine STRAW (*)    Hgb urine  dipstick TRACE (*)    All other components within normal limits  URINALYSIS, MICROSCOPIC (REFLEX) - Abnormal; Notable for the following components:   Bacteria, UA RARE (*)    All other components within normal limits  COMPREHENSIVE METABOLIC PANEL WITH GFR  LIPASE, BLOOD    EKG: None  Radiology: No results found.   Procedures   Medications Ordered in the ED - No data to display   Medical Decision Making Wendy Webb is a 58 y.o. female patient who presents to the emergency department today for further evaluation of constipation.  Patient is nontender on exam and soft with good bowel sounds.  Again I have a low suspicion at this time for bowel obstruction.  However, given the patient's history of triple negative breast cancer we had a shared decision-making discussion about proceeding with a CT scan to further assess for any signs of acute abdomen.  Patient would like to proceed with a CT scan which I think is totally appropriate.  We will also plan to get some basic labs.  CBC is without significant abnormalities.  Urinalysis overall normal.  CMP still pending.  CT abdomen pelvis still pending.  Most of the patient's workup is overall still pending.  Due to shift change, the rest of the patient's care be transferred to oncoming provider.  As long as there are no significant abnormalities in her lab work or on the CT scan that is suggestive of any signs of acute abdomen, the patient can likely be discharged home with MiraLAX .  Ultimate disposition still pending.   Amount and/or Complexity of Data Reviewed Labs: ordered. Radiology: ordered.  Risk OTC drugs.     Final diagnoses:  Constipation, unspecified constipation type    ED Discharge Orders          Ordered    polyethylene glycol powder (GLYCOLAX /MIRALAX ) 17 GM/SCOOP powder   Once        11/16/23 1851               Theotis Cameron HERO, PA-C 11/16/23 1900    Zackowski, Scott, MD 11/18/23 0009

## 2023-11-16 NOTE — Discharge Instructions (Signed)
 Please take 17 g of MiraLAX  4 times per day until you have a bowel movement then you can drop down to 17 g daily for no more than 2 weeks. Please return to the ED for any worsening symptoms.

## 2023-11-16 NOTE — ED Triage Notes (Signed)
 Pt reports constipation since Monday; hx of chronic constipation and takes fiber supplement; denies NV

## 2023-11-20 ENCOUNTER — Encounter: Payer: Self-pay | Admitting: Gastroenterology

## 2023-12-30 ENCOUNTER — Ambulatory Visit: Attending: General Surgery

## 2023-12-30 VITALS — Wt 162.4 lb

## 2023-12-30 DIAGNOSIS — Z483 Aftercare following surgery for neoplasm: Secondary | ICD-10-CM | POA: Insufficient documentation

## 2023-12-30 NOTE — Therapy (Signed)
 OUTPATIENT PHYSICAL THERAPY SOZO SCREENING NOTE   Patient Name: Wendy Webb MRN: [id number] DOB:1966-01-29, 58 y.o., female Today's Date: 12/30/2023  PCP: Jeanette Comer BRAVO, PA-C REFERRING PROVIDER: Curvin Deward MOULD, MD   PT End of Session - 12/30/23 1516     Visit Number 8   # unchanged due to screen only   PT Start Time 1514    PT Stop Time 1518    PT Time Calculation (min) 4 min    Activity Tolerance Patient tolerated treatment well    Behavior During Therapy [facility] for tasks assessed/performed          Past Medical History:  Diagnosis Date   Breast cancer (HCC) 2023   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+   Family history of breast cancer 10/04/2021   Hypertension    IBS (irritable bowel syndrome)    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HCC)    09/26/2021 (right breast biopsy)   Mild mitral regurgitation 06/2019   Palpitations    Personal history of chemotherapy    Personal history of radiation therapy    PONV (postoperative nausea and vomiting)    Port-A-Cath in place 10/20/2021   Past Surgical History:  Procedure Laterality Date   BREAST BIOPSY  05/03/2022   MM RT RADIOACTIVE SEED LOC MAMMO GUIDE 05/03/2022 [facility] MAMMOGRAPHY   BREAST BIOPSY Right 10/20/2021   BREAST LUMPECTOMY Right 05/04/2022   BREAST LUMPECTOMY WITH RADIOACTIVE SEED AND SENTINEL LYMPH NODE BIOPSY Right 05/04/2022   Procedure: RIGHT BREAST LUMPECTOMY WITH RADIOACTIVE SEED AND SENTINEL LYMPH NODE BIOPSY;  Surgeon: Curvin Deward MOULD, MD;  Location: [facility];  Service: General;  Laterality: Right;   CHOLECYSTECTOMY     CYST REMOVAL TRUNK Right 05/04/2022   Procedure: CYST REMOVAL AXILLA;  Surgeon: Curvin Deward MOULD, MD;  Location: [facility];  Service: General;  Laterality: Right;   GALLBLADDER SURGERY     PORT-A-CATH REMOVAL Left 08/10/2022   Procedure: REMOVAL PORT-A-CATH;  Surgeon: Curvin Deward MOULD, MD;  Location: [facility];  Service: General;  Laterality: Left;    PORTACATH PLACEMENT Left 10/18/2021   Procedure: PLA CEMENT OF PORT;  Surgeon: Curvin Deward MOULD, MD;  Location: [facility] OR;  Service: General;  Laterality: Left;   Patient Active Problem List   Diagnosis Date Noted   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 It measures 2.5 cm and is located in the upper outer quadrant. It is triple negative with a Ki67 of 95%. Completed chemotherapy AC, carboplatin , Paclitaxel , and keytruda . Rt lumpectomy and SLNB on 05/04/22 with 2 negative nodes removed   PRECAUTIONS: right UE Lymphedema risk, None  SUBJECTIVE: Pt returns for her 3 month L-dex screen.   PAIN:  Are you having pain? No  SOZO SCREENING: Patient was assessed today using the SOZO machine to determine the lymphedema index score. This was compared to her baseline score. It was determined that she is within the recommended range when compared to her baseline  and no further action is needed at this time. She will continue SOZO screenings. These are done every 3 months for 2 years post operatively followed by every 6 months for 2 years, and then annually.   L-DEX FLOWSHEETS - 12/30/23 1500       L-DEX LYMPHEDEMA SCREENING   Measurement Type Unilateral    L-DEX MEASUREMENT EXTREMITY Upper Extremity    POSITION  Standing    DOMINANT SIDE Left    At Risk Side Right    BASELINE SCORE (UNILATERAL) 5.6    L-DEX SCORE  (UNILATERAL) 6.5    VALUE CHANGE (UNILAT) 0.9         P: One more 3 month L-Dex screen then transition to 6 months next.   Aden Berwyn Caldron, PTA 12/30/2023, 3:17 PM

## 2024-01-31 ENCOUNTER — Ambulatory Visit: Admitting: Gastroenterology

## 2024-03-30 ENCOUNTER — Ambulatory Visit: Attending: General Surgery

## 2024-03-30 VITALS — Wt 166.2 lb

## 2024-03-30 DIAGNOSIS — Z483 Aftercare following surgery for neoplasm: Secondary | ICD-10-CM | POA: Insufficient documentation

## 2024-03-30 NOTE — Therapy (Signed)
 " OUTPATIENT PHYSICAL THERAPY SOZO SCREENING NOTE   Patient Name: Wendy Webb MRN: [id number] DOB:July 28, 1965, 58 y.o., female Today's Date: 03/30/2024  PCP: Jeanette Comer BRAVO, PA-C REFERRING PROVIDER: Curvin Deward MOULD, MD   PT End of Session - 03/30/24 [phone]     Visit Number 8   # unchanged due to screen only   PT Start Time 0918    PT Stop Time 0922    PT Time Calculation (min) 4 min    Activity Tolerance Patient tolerated treatment well    Behavior During Therapy [facility] for tasks assessed/performed          Past Medical History:  Diagnosis Date   Breast cancer (HCC) 2023   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+   Family history of breast cancer 10/04/2021   Hypertension    IBS (irritable bowel syndrome)    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HCC)    09/26/2021 (right breast biopsy)   Mild mitral regurgitation 06/2019   Palpitations    Personal history of chemotherapy    Personal history of radiation therapy    PONV (postoperative nausea and vomiting)    Port-A-Cath in place 10/20/2021   Past Surgical History:  Procedure Laterality Date   BREAST BIOPSY  05/03/2022   MM RT RADIOACTIVE SEED LOC MAMMO GUIDE 05/03/2022 [facility] MAMMOGRAPHY   BREAST BIOPSY Right 10/20/2021   BREAST LUMPECTOMY Right 05/04/2022   BREAST LUMPECTOMY WITH RADIOACTIVE SEED AND SENTINEL LYMPH NODE BIOPSY Right 05/04/2022   Procedure: RIGHT BREAST LUMPECTOMY WITH RADIOACTIVE SEED AND SENTINEL LYMPH NODE BIOPSY;  Surgeon: Curvin Deward MOULD, MD;  Location: [facility];  Service: General;  Laterality: Right;   CHOLECYSTECTOMY     CYST REMOVAL TRUNK Right 05/04/2022   Procedure: CYST REMOVAL AXILLA;  Surgeon: Curvin Deward MOULD, MD;  Location: [facility];  Service: General;  Laterality: Right;   GALLBLADDER SURGERY     PORT-A-CATH REMOVAL Left 08/10/2022   Procedure: REMOVAL PORT-A-CATH;  Surgeon: Curvin Deward MOULD, MD;  Location: [facility];  Service: General;  Laterality: Left;    PORTACATH PLACEMENT Left 10/18/2021   Procedure: PLA CEMENT OF PORT;  Surgeon: Curvin Deward MOULD, MD;  Location: [facility] OR;  Service: General;  Laterality: Left;   Patient Active Problem List   Diagnosis Date Noted   Chemotherapy-induced neuropathy 02/23/2022   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 It measures 2.5 cm and is located in the upper outer quadrant. It is triple negative with a Ki67 of 95%. Completed chemotherapy AC, carboplatin , Paclitaxel , and keytruda . Rt lumpectomy and SLNB on 05/04/22 with 2 negative nodes removed   PRECAUTIONS: right UE Lymphedema risk, None  SUBJECTIVE: Pt returns for her last 3 month L-dex screen.   PAIN:  Are you having pain? No  SOZO SCREENING: Patient was assessed today using the SOZO machine to determine the lymphedema index score. This was compared to her baseline score. It was determined that she is within the recommended range when compared to her  baseline and no further action is needed at this time. She will continue SOZO screenings. These are done every 3 months for 2 years post operatively followed by every 6 months for 2 years, and then annually.   L-DEX FLOWSHEETS - 03/30/24 0900       L-DEX LYMPHEDEMA SCREENING   Measurement Type Unilateral    L-DEX MEASUREMENT EXTREMITY Upper Extremity    POSITION  Standing    DOMINANT SIDE Left    At Risk Side Right    BASELINE SCORE (UNILATERAL) 5.6    L-DEX SCORE  (UNILATERAL) 5.7    VALUE CHANGE (UNILAT) 0.1         P: Transition to 6 months until 04/2026, then can transition to annual.   Aden Berwyn Caldron, PTA 03/30/2024, 9:21 AM     "

## 2024-05-19 ENCOUNTER — Inpatient Hospital Stay: Payer: BC Managed Care – PPO | Admitting: Hematology and Oncology

## 2024-09-28 ENCOUNTER — Ambulatory Visit
